# Patient Record
Sex: Male | Born: 1970
Health system: Southern US, Community
[De-identification: ages and names within clinical notes are randomized; demographics above are authoritative.]

## PROBLEM LIST (undated history)

## (undated) DIAGNOSIS — I10 Essential (primary) hypertension: Secondary | ICD-10-CM

## (undated) DIAGNOSIS — E1159 Type 2 diabetes mellitus with other circulatory complications: Secondary | ICD-10-CM

## (undated) DIAGNOSIS — G4730 Sleep apnea, unspecified: Secondary | ICD-10-CM

## (undated) DIAGNOSIS — D649 Anemia, unspecified: Secondary | ICD-10-CM

## (undated) DIAGNOSIS — I152 Hypertension secondary to endocrine disorders: Secondary | ICD-10-CM

## (undated) DIAGNOSIS — F419 Anxiety disorder, unspecified: Secondary | ICD-10-CM

## (undated) DIAGNOSIS — K219 Gastro-esophageal reflux disease without esophagitis: Secondary | ICD-10-CM

## (undated) DIAGNOSIS — K5792 Diverticulitis of intestine, part unspecified, without perforation or abscess without bleeding: Secondary | ICD-10-CM

## (undated) DIAGNOSIS — E119 Type 2 diabetes mellitus without complications: Secondary | ICD-10-CM

## (undated) HISTORY — DX: Essential (primary) hypertension: I10

## (undated) HISTORY — DX: Hypertension secondary to endocrine disorders: I15.2

## (undated) HISTORY — DX: Type 2 diabetes mellitus with other circulatory complications: E11.59

## (undated) HISTORY — PX: FRACTURE SURGERY: SHX138

## (undated) HISTORY — DX: Anemia, unspecified: D64.9

## (undated) HISTORY — DX: Anxiety disorder, unspecified: F41.9

## (undated) HISTORY — DX: Gastro-esophageal reflux disease without esophagitis: K21.9

---

## 1999-09-12 ENCOUNTER — Emergency Department (HOSPITAL_COMMUNITY): Admission: EM | Admit: 1999-09-12 | Discharge: 1999-09-12 | Payer: Self-pay | Admitting: Emergency Medicine

## 2000-08-22 ENCOUNTER — Encounter: Payer: Self-pay | Admitting: Emergency Medicine

## 2000-08-22 ENCOUNTER — Observation Stay (HOSPITAL_COMMUNITY): Admission: EM | Admit: 2000-08-22 | Discharge: 2000-08-23 | Payer: Self-pay | Admitting: Emergency Medicine

## 2000-08-23 ENCOUNTER — Encounter: Payer: Self-pay | Admitting: *Deleted

## 2000-11-10 ENCOUNTER — Encounter: Payer: Self-pay | Admitting: Gastroenterology

## 2000-11-10 ENCOUNTER — Encounter: Admission: RE | Admit: 2000-11-10 | Discharge: 2000-11-10 | Payer: Self-pay | Admitting: Gastroenterology

## 2006-09-25 ENCOUNTER — Ambulatory Visit: Payer: Self-pay | Admitting: Internal Medicine

## 2006-09-25 LAB — CONVERTED CEMR LAB
ALT: 52 units/L — ABNORMAL HIGH (ref 0–40)
AST: 28 units/L (ref 0–37)
BUN: 10 mg/dL (ref 6–23)
Basophils Absolute: 0 10*3/uL (ref 0.0–0.1)
Basophils Relative: 0 % (ref 0.0–1.0)
Chol/HDL Ratio, serum: 7.9
Cholesterol: 185 mg/dL (ref 0–200)
Creatinine, Ser: 1 mg/dL (ref 0.4–1.5)
Creatinine,U: 155.3 mg/dL
Eosinophil percent: 2.2 % (ref 0.0–5.0)
Free T4: 0.6 ng/dL — ABNORMAL LOW (ref 0.9–1.8)
HCT: 42.6 % (ref 39.0–52.0)
HDL: 23.5 mg/dL — ABNORMAL LOW (ref >39.0)
Hemoglobin: 14.3 g/dL (ref 13.0–17.0)
Hgb A1c MFr Bld: 5.8 % (ref 4.6–6.0)
LDL DIRECT: 117.5 mg/dL
Lymphocytes Relative: 24.6 % (ref 12.0–46.0)
MCHC: 33.5 g/dL (ref 30.0–36.0)
MCV: 83.5 fL (ref 78.0–100.0)
Microalb Creat Ratio: 3.2 mg/g (ref 0.0–30.0)
Microalb, Ur: 0.5 mg/dL (ref 0.0–1.9)
Monocytes Absolute: 0.1 10*3/uL — ABNORMAL LOW (ref 0.2–0.7)
Monocytes Relative: 1 % — ABNORMAL LOW (ref 3.0–11.0)
Neutro Abs: 6.8 10*3/uL (ref 1.4–7.7)
Neutrophils Relative %: 72.2 % (ref 43.0–77.0)
Platelets: 356 10*3/uL (ref 150–400)
Potassium: 4.2 meq/L (ref 3.5–5.1)
RBC: 5.1 M/uL (ref 4.22–5.81)
RDW: 13.5 % (ref 11.5–14.6)
TSH: 2.69 microintl units/mL (ref 0.35–5.50)
Triglyceride fasting, serum: 290 mg/dL (ref 0–149)
VLDL: 58 mg/dL — ABNORMAL HIGH (ref 0–40)
WBC: 9.4 10*3/uL (ref 4.5–10.5)

## 2006-11-28 DIAGNOSIS — K449 Diaphragmatic hernia without obstruction or gangrene: Secondary | ICD-10-CM | POA: Insufficient documentation

## 2008-01-16 ENCOUNTER — Telehealth (INDEPENDENT_AMBULATORY_CARE_PROVIDER_SITE_OTHER): Payer: Self-pay | Admitting: *Deleted

## 2008-01-16 ENCOUNTER — Emergency Department (HOSPITAL_COMMUNITY): Admission: EM | Admit: 2008-01-16 | Discharge: 2008-01-16 | Payer: Self-pay | Admitting: Emergency Medicine

## 2008-07-15 ENCOUNTER — Telehealth (INDEPENDENT_AMBULATORY_CARE_PROVIDER_SITE_OTHER): Payer: Self-pay | Admitting: *Deleted

## 2010-01-16 LAB — TSH: TSH: 2.64 u[IU]/mL (ref 0.41–5.90)

## 2010-01-16 LAB — HEPATIC FUNCTION PANEL
ALT: 19 U/L (ref 10–40)
AST: 37 U/L (ref 14–40)
Alkaline Phosphatase: 120 U/L (ref 25–125)
Bilirubin, Total: 0.4 mg/dL

## 2010-01-16 LAB — BASIC METABOLIC PANEL
BUN: 12 mg/dL (ref 4–21)
Creatinine: 0.8 mg/dL (ref 0.6–1.3)
Glucose: 104 mg/dL

## 2010-01-16 LAB — LIPID PANEL: LDL Cholesterol: 94 mg/dL

## 2011-11-10 ENCOUNTER — Ambulatory Visit (INDEPENDENT_AMBULATORY_CARE_PROVIDER_SITE_OTHER): Payer: PRIVATE HEALTH INSURANCE

## 2011-11-10 DIAGNOSIS — F411 Generalized anxiety disorder: Secondary | ICD-10-CM

## 2011-11-10 DIAGNOSIS — I10 Essential (primary) hypertension: Secondary | ICD-10-CM

## 2011-11-10 DIAGNOSIS — K219 Gastro-esophageal reflux disease without esophagitis: Secondary | ICD-10-CM

## 2012-10-20 ENCOUNTER — Other Ambulatory Visit: Payer: Self-pay | Admitting: Physician Assistant

## 2012-10-27 ENCOUNTER — Ambulatory Visit (INDEPENDENT_AMBULATORY_CARE_PROVIDER_SITE_OTHER): Payer: BC Managed Care – PPO | Admitting: Emergency Medicine

## 2012-10-27 VITALS — BP 125/85 | HR 77 | Temp 98.4°F | Resp 16 | Ht 74.0 in | Wt 352.0 lb

## 2012-10-27 DIAGNOSIS — K449 Diaphragmatic hernia without obstruction or gangrene: Secondary | ICD-10-CM

## 2012-10-27 DIAGNOSIS — Z23 Encounter for immunization: Secondary | ICD-10-CM

## 2012-10-27 DIAGNOSIS — I10 Essential (primary) hypertension: Secondary | ICD-10-CM

## 2012-10-27 MED ORDER — SERTRALINE HCL 100 MG PO TABS: 100.0000 mg | ORAL_TABLET | Freq: Every day | ORAL | Status: DC

## 2012-10-27 MED ORDER — LISINOPRIL-HYDROCHLOROTHIAZIDE 10-12.5 MG PO TABS: 1.0000 | ORAL_TABLET | Freq: Every day | ORAL | Status: DC

## 2012-10-27 MED ORDER — ESOMEPRAZOLE MAGNESIUM 40 MG PO CPDR: 40.0000 mg | DELAYED_RELEASE_CAPSULE | Freq: Every day | ORAL | Status: DC

## 2012-10-27 NOTE — Progress Notes (Signed)
[facility] [address] [place] [address] [phone]  Date:  10/27/2012   Name:  KELDRICK POMPLUN   DOB:  May 18, 1971   MRN:  [id number]  PCP:  No primary provider on file.    Chief Complaint: Medication Refill   History of Present Illness:  John Mullins is a 41 y.o. very pleasant male patient who presents with the following:   For refill on medications until he can come in for his usual physical.  Out of meds.  Tolerating medications well with no side effects.    Patient Active Problem List  Diagnosis  . HIATAL HERNIA    History reviewed. No pertinent past medical history.  History reviewed. No pertinent past surgical history.  History  Substance Use Topics  . Smoking status: Never Smoker   . Smokeless tobacco: Not on file  . Alcohol Use: No    History reviewed. No pertinent family history.  No Known Allergies  Medication list has been reviewed and updated.  Current Outpatient Prescriptions on File Prior to Visit  Medication Sig Dispense Refill  . esomeprazole (NEXIUM) 40 MG capsule Take 40 mg by mouth daily before breakfast.      . lisinopril-hydrochlorothiazide (PRINZIDE,ZESTORETIC) 10-12.5 MG per tablet TAKE ONE TABLET BY MOUTH EVERY DAY  30 tablet  0  . sertraline (ZOLOFT) 100 MG tablet Take 100 mg by mouth daily.        Review of Systems:  As per HPI, otherwise negative.    Physical Examination: Filed Vitals:   10/27/12 1222  BP: 125/85  Pulse: 77  Temp: 98.4 F (36.9 C)  Resp: 16   Filed Vitals:   10/27/12 1222  Height: 6\' 2"  (1.88 m)  Weight: 352 lb (159.666 kg)   Body mass index is 45.19 kg/(m^2). Ideal Body Weight: Weight in (lb) to have BMI = 25: 194.3   GEN: morbidly obese, NAD, Non-toxic, A & O x 3 HEENT: Atraumatic, Normocephalic. Neck supple. No masses, No LAD. Ears and Nose: No external deformity. CV: RRR, No M/G/R. No JVD. No thrill. No extra heart sounds. PULM: CTA B, no wheezes, crackles,  rhonchi. No retractions. No resp. distress. No accessory muscle use. ABD: S, NT, ND, +BS. No rebound. No HSM. EXTR: No c/c/e NEURO Normal gait.  PSYCH: Normally interactive. Conversant. Not depressed or anxious appearing.  Calm demeanor.    Assessment and Plan: Hypertension Hiatal hernia with reflux Refill meds Follow up for labs as directed  Carmelina Dane, MD

## 2012-11-06 ENCOUNTER — Telehealth: Payer: Self-pay

## 2012-11-06 MED ORDER — OMEPRAZOLE 40 MG PO CPDR: 40.0000 mg | DELAYED_RELEASE_CAPSULE | Freq: Every day | ORAL | Status: DC

## 2012-11-06 NOTE — Telephone Encounter (Signed)
Any alternative? Please advise.

## 2012-11-06 NOTE — Telephone Encounter (Signed)
Pt saying that the nexium we prescribed for him is 280.00 would like to know if we could call in something else please call [phone]

## 2012-11-06 NOTE — Telephone Encounter (Signed)
Omeprazole 40mg  sent to pharmacy, hopefully this will be more affordable

## 2012-11-07 NOTE — Telephone Encounter (Signed)
Pt's wife advised.

## 2013-02-11 ENCOUNTER — Other Ambulatory Visit: Payer: Self-pay | Admitting: Physician Assistant

## 2013-04-30 ENCOUNTER — Other Ambulatory Visit: Payer: Self-pay | Admitting: Physician Assistant

## 2013-06-04 ENCOUNTER — Ambulatory Visit (INDEPENDENT_AMBULATORY_CARE_PROVIDER_SITE_OTHER): Payer: BC Managed Care – PPO | Admitting: Physician Assistant

## 2013-06-04 VITALS — BP 128/88 | HR 96 | Temp 97.9°F | Resp 18 | Ht 73.0 in | Wt 348.0 lb

## 2013-06-04 DIAGNOSIS — F419 Anxiety disorder, unspecified: Secondary | ICD-10-CM

## 2013-06-04 DIAGNOSIS — E66813 Obesity, class 3: Secondary | ICD-10-CM

## 2013-06-04 DIAGNOSIS — K219 Gastro-esophageal reflux disease without esophagitis: Secondary | ICD-10-CM

## 2013-06-04 DIAGNOSIS — I10 Essential (primary) hypertension: Secondary | ICD-10-CM

## 2013-06-04 DIAGNOSIS — E1159 Type 2 diabetes mellitus with other circulatory complications: Secondary | ICD-10-CM | POA: Insufficient documentation

## 2013-06-04 DIAGNOSIS — F411 Generalized anxiety disorder: Secondary | ICD-10-CM

## 2013-06-04 HISTORY — DX: Morbid (severe) obesity due to excess calories: E66.01

## 2013-06-04 HISTORY — DX: Anxiety disorder, unspecified: F41.9

## 2013-06-04 HISTORY — DX: Generalized anxiety disorder: F41.1

## 2013-06-04 HISTORY — DX: Obesity, class 3: E66.813

## 2013-06-04 LAB — COMPREHENSIVE METABOLIC PANEL
ALT: 43 U/L (ref 0–53)
CO2: 28 mEq/L (ref 19–32)
Calcium: 10 mg/dL (ref 8.4–10.5)
Chloride: 102 mEq/L (ref 96–112)
Creat: 0.9 mg/dL (ref 0.50–1.35)
Total Protein: 7.4 g/dL (ref 6.0–8.3)

## 2013-06-04 LAB — LIPID PANEL
Cholesterol: 168 mg/dL (ref 0–200)
Total CHOL/HDL Ratio: 5.3 Ratio

## 2013-06-04 MED ORDER — LISINOPRIL-HYDROCHLOROTHIAZIDE 10-12.5 MG PO TABS: 1.0000 | ORAL_TABLET | Freq: Every day | ORAL | Status: DC

## 2013-06-04 MED ORDER — SERTRALINE HCL 100 MG PO TABS: 100.0000 mg | ORAL_TABLET | Freq: Every day | ORAL | Status: DC

## 2013-06-04 MED ORDER — OMEPRAZOLE 40 MG PO CPDR: 40.0000 mg | DELAYED_RELEASE_CAPSULE | Freq: Every day | ORAL | Status: DC

## 2013-06-04 NOTE — Progress Notes (Signed)
   [address], [place] [address]   Phone [phone]  Subjective:    Patient ID: John Mullins, male    DOB: 1971-02-15, 42 y.o.   MRN: [id number]  HPI Pt presents to clinic for med refills.  He feels good. 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  His dad died in his late 58s from a stroke with severe heart disease.  His GERD is well controlled on his meds as his anxiety.    Review of Systems  Respiratory: Negative for cough.   Cardiovascular: Negative for chest pain.  Psychiatric/Behavioral: The patient is nervous/anxious.        Objective:   Physical Exam  Vitals reviewed. Constitutional: He is oriented to person, place, and time. He appears well-developed and well-nourished.  HENT:  Head: Normocephalic and atraumatic.  Right Ear: External ear normal.  Left Ear: External ear normal.  Cardiovascular: Normal rate, regular rhythm and normal heart sounds.   No murmur heard. Pulmonary/Chest: Effort normal and breath sounds normal.  Neurological: He is alert and oriented to person, place, and time.  Skin: Skin is warm and dry.  Psychiatric: He has a normal mood and affect. His behavior is normal. Judgment and thought content normal.          Assessment & Plan:  HTN (hypertension) - Plan: Comprehensive metabolic panel, Lipid panel, lisinopril-hydrochlorothiazide (PRINZIDE,ZESTORETIC) 10-12.5 MG per tablet  Anxiety state, unspecified - Plan: sertraline (ZOLOFT) 100 MG tablet  GERD (gastroesophageal reflux disease) - Plan: omeprazole (PRILOSEC) 40 MG capsule  Obesity, Class III, BMI 40-49.9 (morbid obesity)  Pt has well controlled HTN, GERD and anxiety.  He is morbidly obese and we talked about that increasing his risk factors for heart disease. He has never had an EKG and he will plan on scheduling a CPE in 6 months when that can be done (he declined it today).  We will check his cholesterol today and treat if  indicated.  Benny Lennert PA-C 06/04/2013 9:46 AM

## 2013-06-06 NOTE — Progress Notes (Signed)
Left msg for pt to call to schedule future CPE.

## 2013-06-13 NOTE — Progress Notes (Signed)
Sent pt reminder letter to schedule future CPE.  

## 2014-01-30 ENCOUNTER — Telehealth: Payer: Self-pay

## 2014-01-30 ENCOUNTER — Other Ambulatory Visit: Payer: Self-pay | Admitting: Physician Assistant

## 2014-01-30 NOTE — Telephone Encounter (Signed)
Pts wife called for patient to request rx refill for bp meds

## 2014-01-30 NOTE — Telephone Encounter (Signed)
Needs office visit.

## 2014-03-14 ENCOUNTER — Other Ambulatory Visit: Payer: Self-pay | Admitting: Family Medicine

## 2014-03-14 ENCOUNTER — Ambulatory Visit (INDEPENDENT_AMBULATORY_CARE_PROVIDER_SITE_OTHER): Payer: BC Managed Care – PPO | Admitting: Family Medicine

## 2014-03-14 VITALS — BP 110/70 | HR 89 | Temp 98.2°F | Resp 18 | Ht 73.0 in | Wt 368.0 lb

## 2014-03-14 DIAGNOSIS — F329 Major depressive disorder, single episode, unspecified: Secondary | ICD-10-CM

## 2014-03-14 DIAGNOSIS — F3289 Other specified depressive episodes: Secondary | ICD-10-CM

## 2014-03-14 DIAGNOSIS — E669 Obesity, unspecified: Secondary | ICD-10-CM

## 2014-03-14 DIAGNOSIS — R5381 Other malaise: Secondary | ICD-10-CM

## 2014-03-14 DIAGNOSIS — K219 Gastro-esophageal reflux disease without esophagitis: Secondary | ICD-10-CM

## 2014-03-14 DIAGNOSIS — F32A Depression, unspecified: Secondary | ICD-10-CM

## 2014-03-14 DIAGNOSIS — I10 Essential (primary) hypertension: Secondary | ICD-10-CM

## 2014-03-14 DIAGNOSIS — R5383 Other fatigue: Secondary | ICD-10-CM

## 2014-03-14 DIAGNOSIS — D72829 Elevated white blood cell count, unspecified: Secondary | ICD-10-CM

## 2014-03-14 MED ORDER — LISINOPRIL-HYDROCHLOROTHIAZIDE 10-12.5 MG PO TABS
1.0000 | ORAL_TABLET | Freq: Every day | ORAL | Status: DC
Start: 2014-03-14 — End: 2015-04-27

## 2014-03-14 MED ORDER — SERTRALINE HCL 100 MG PO TABS: ORAL_TABLET | ORAL | Status: DC

## 2014-03-14 MED ORDER — OMEPRAZOLE 40 MG PO CPDR: DELAYED_RELEASE_CAPSULE | ORAL | Status: DC

## 2014-03-14 NOTE — Patient Instructions (Signed)
We will set up your sleep study, and  I will be in touch with your labs.  Good to see you today!

## 2014-03-14 NOTE — Progress Notes (Signed)
[facility] [address] [place] [address] [phone]  Date:  03/14/2014   Name:  John Mullins   DOB:  1971/08/19   MRN:  [id number]  PCP:  No PCP Per Patient    Chief Complaint: Medication Refill   History of Present Illness:  John Mullins is a 43 y.o. very pleasant male patient who presents with the following:  He needs a refill of his medications today.  He is doing well.  He does not generally check his BP. zoloft is doing well for him- he does not have anhedonia. His mood is good.  Energy level is "hit or miss.' He does admit that he snores quite a bit, and his wife notes that he may stop breathing at night. He thinks he may have sleep apnea and would like to have a sleep study He last ate around 5 hours ago today  Patient Active Problem List   Diagnosis Date Noted  . HTN (hypertension) 06/04/2013  . Anxiety state, unspecified 06/04/2013  . GERD (gastroesophageal reflux disease) 06/04/2013  . Obesity, Class III, BMI 40-49.9 (morbid obesity) 06/04/2013  . HIATAL HERNIA 11/28/2006    History reviewed. No pertinent past medical history.  History reviewed. No pertinent past surgical history.  History  Substance Use Topics  . Smoking status: Never Smoker   . Smokeless tobacco: Not on file  . Alcohol Use: No    Family History  Problem Relation Age of Onset  . Heart disease Father   . Stroke Father     No Known Allergies  Medication list has been reviewed and updated.  Current Outpatient Prescriptions on File Prior to Visit  Medication Sig Dispense Refill  . lisinopril-hydrochlorothiazide (PRINZIDE,ZESTORETIC) 10-12.5 MG per tablet Take 1 tablet by mouth daily.  30 tablet  5  . omeprazole (PRILOSEC) 40 MG capsule TAKE ONE CAPSULE BY MOUTH ONCE DAILY  30 capsule  0  . sertraline (ZOLOFT) 100 MG tablet TAKE ONE TABLET BY MOUTH ONCE DAILY  30 tablet  0   No current facility-administered medications on file prior to visit.     Review of Systems:  As per HPI- otherwise negative.   Physical Examination: Filed Vitals:   03/14/14 1652  BP: 110/70  Pulse: 89  Temp: 98.2 F (36.8 C)  Resp: 18   Filed Vitals:   03/14/14 1652  Height: 6\' 1"  (1.854 m)  Weight: 368 lb (166.924 kg)   Body mass index is 48.56 kg/(m^2). Ideal Body Weight: Weight in (lb) to have BMI = 25: 189.1  GEN: WDWN, NAD, Non-toxic, A & O x 3, morbid obesity, looks well HEENT: Atraumatic, Normocephalic. Neck supple. No masses, No LAD. Ears and Nose: No external deformity. CV: RRR, No M/G/R. No JVD. No thrill. No extra heart sounds. PULM: CTA B, no wheezes, crackles, rhonchi. No retractions. No resp. distress. No accessory muscle use. EXTR: No c/c/e NEURO Normal gait.  PSYCH: Normally interactive. Conversant. Not depressed or anxious appearing.  Calm demeanor.    Assessment and Plan: HTN (hypertension) - Plan: lisinopril-hydrochlorothiazide (PRINZIDE,ZESTORETIC) 10-12.5 MG per tablet, CBC with Differential, Comprehensive metabolic panel  Other malaise and fatigue - Plan: Nocturnal polysomnography (NPSG), TSH  Obesity, unspecified - Plan: Nocturnal polysomnography (NPSG), Lipid panel  GERD (gastroesophageal reflux disease) - Plan: omeprazole (PRILOSEC) 40 MG capsule  Depression - Plan: sertraline (ZOLOFT) 100 MG tablet  Bp well controlled.  Sleep study for likely OSA Refilled zoloft  Will plan further follow- up pending  labs.   Signed Lamar Blinks, MD

## 2014-03-15 LAB — CBC WITH DIFFERENTIAL/PLATELET
Basophils Absolute: 0 10*3/uL (ref 0.0–0.1)
Basophils Relative: 0 % (ref 0–1)
Eosinophils Absolute: 0.1 10*3/uL (ref 0.0–0.7)
Eosinophils Relative: 1 % (ref 0–5)
HCT: 39.6 % (ref 39.0–52.0)
HEMOGLOBIN: 13.5 g/dL (ref 13.0–17.0)
Lymphocytes Relative: 26 % (ref 12–46)
Lymphs Abs: 3.6 10*3/uL (ref 0.7–4.0)
MCH: 27.3 pg (ref 26.0–34.0)
MCHC: 34.1 g/dL (ref 30.0–36.0)
MCV: 80.2 fL (ref 78.0–100.0)
MONOS PCT: 7 % (ref 3–12)
Monocytes Absolute: 1 10*3/uL (ref 0.1–1.0)
NEUTROS ABS: 9.2 10*3/uL — AB (ref 1.7–7.7)
NEUTROS PCT: 66 % (ref 43–77)
Platelets: 405 10*3/uL — ABNORMAL HIGH (ref 150–400)
RBC: 4.94 MIL/uL (ref 4.22–5.81)
RDW: 15.1 % (ref 11.5–15.5)
WBC: 14 10*3/uL — ABNORMAL HIGH (ref 4.0–10.5)

## 2014-03-15 LAB — TSH: TSH: 3.121 u[IU]/mL (ref 0.350–4.500)

## 2014-03-16 LAB — COMPREHENSIVE METABOLIC PANEL
ALBUMIN: 4.5 g/dL (ref 3.5–5.2)
ALT: 36 U/L (ref 0–53)
AST: 22 U/L (ref 0–37)
Alkaline Phosphatase: 132 U/L — ABNORMAL HIGH (ref 39–117)
BUN: 15 mg/dL (ref 6–23)
CHLORIDE: 101 meq/L (ref 96–112)
CO2: 23 meq/L (ref 19–32)
Calcium: 9.6 mg/dL (ref 8.4–10.5)
Creat: 0.99 mg/dL (ref 0.50–1.35)
GLUCOSE: 94 mg/dL (ref 70–99)
POTASSIUM: 4.3 meq/L (ref 3.5–5.3)
SODIUM: 137 meq/L (ref 135–145)
TOTAL PROTEIN: 7.5 g/dL (ref 6.0–8.3)
Total Bilirubin: 0.4 mg/dL (ref 0.2–1.2)

## 2014-03-16 LAB — LIPID PANEL
CHOLESTEROL: 176 mg/dL (ref 0–200)
HDL: 32 mg/dL — ABNORMAL LOW (ref >39)
LDL Cholesterol: 96 mg/dL (ref 0–99)
Total CHOL/HDL Ratio: 5.5 Ratio
Triglycerides: 241 mg/dL — ABNORMAL HIGH (ref <150)
VLDL: 48 mg/dL — ABNORMAL HIGH (ref 0–40)

## 2014-03-18 ENCOUNTER — Encounter: Payer: Self-pay | Admitting: Family Medicine

## 2014-03-18 NOTE — Addendum Note (Signed)
Addended by: Lamar Blinks C on: 03/18/2014 03:09 PM   Modules accepted: Orders

## 2014-03-19 ENCOUNTER — Telehealth: Payer: Self-pay | Admitting: Radiology

## 2014-03-19 LAB — GAMMA GT: GGT: 53 U/L — ABNORMAL HIGH (ref 7–51)

## 2014-03-19 NOTE — Telephone Encounter (Signed)
I am reordering the sleep study, should be amb referral to sleep center, unless you want to see if patient can have home sleep study. Please advise, pended order.

## 2014-03-21 ENCOUNTER — Encounter: Payer: Self-pay | Admitting: Family Medicine

## 2014-10-13 ENCOUNTER — Ambulatory Visit (INDEPENDENT_AMBULATORY_CARE_PROVIDER_SITE_OTHER): Payer: BC Managed Care – PPO | Admitting: Physician Assistant

## 2014-10-13 VITALS — BP 128/82 | HR 109 | Temp 98.1°F | Resp 20 | Ht 73.0 in | Wt 363.0 lb

## 2014-10-13 DIAGNOSIS — H9201 Otalgia, right ear: Secondary | ICD-10-CM

## 2014-10-13 DIAGNOSIS — R059 Cough, unspecified: Secondary | ICD-10-CM

## 2014-10-13 DIAGNOSIS — R05 Cough: Secondary | ICD-10-CM

## 2014-10-13 DIAGNOSIS — R0981 Nasal congestion: Secondary | ICD-10-CM

## 2014-10-13 MED ORDER — BENZONATATE 100 MG PO CAPS: 100.0000 mg | ORAL_CAPSULE | Freq: Three times a day (TID) | ORAL | Status: DC | PRN

## 2014-10-13 MED ORDER — GUAIFENESIN ER 1200 MG PO TB12: 1.0000 | ORAL_TABLET | Freq: Two times a day (BID) | ORAL | Status: DC | PRN

## 2014-10-13 MED ORDER — IPRATROPIUM BROMIDE 0.03 % NA SOLN: 2.0000 | Freq: Two times a day (BID) | NASAL | Status: DC

## 2014-10-13 MED ORDER — AMOXICILLIN 500 MG PO CAPS: 1000.0000 mg | ORAL_CAPSULE | Freq: Three times a day (TID) | ORAL | Status: DC

## 2014-10-13 NOTE — Progress Notes (Signed)
Subjective:    Patient ID: John Mullins, male    DOB: 1971-04-08, 43 y.o.   MRN: [id number]  PCP: No PCP Per Patient  Chief Complaint  Patient presents with  . Cough    x 4 days getting worse  . Sore Throat  . Ear Pain    right   Patient Active Problem List   Diagnosis Date Noted  . HTN (hypertension) 06/04/2013  . Anxiety state, unspecified 06/04/2013  . GERD (gastroesophageal reflux disease) 06/04/2013  . Obesity, Class III, BMI 40-49.9 (morbid obesity) 06/04/2013  . HIATAL HERNIA 11/28/2006   Prior to Admission medications   Medication Sig Start Date End Date Taking? Authorizing Provider  lisinopril-hydrochlorothiazide (PRINZIDE,ZESTORETIC) 10-12.5 MG per tablet Take 1 tablet by mouth daily. 03/14/14  Yes Gay Filler Copland, MD  omeprazole (PRILOSEC) 40 MG capsule TAKE ONE CAPSULE BY MOUTH ONCE DAILY 03/14/14  Yes Gay Filler Copland, MD  sertraline (ZOLOFT) 100 MG tablet TAKE ONE TABLET BY MOUTH ONCE DAILY 03/14/14  Yes Gay Filler Copland, MD  amoxicillin (AMOXIL) 500 MG capsule Take 2 capsules (1,000 mg total) by mouth 3 (three) times daily. 10/13/14   Araceli Bouche, PA  benzonatate (TESSALON) 100 MG capsule Take 1-2 capsules (100-200 mg total) by mouth 3 (three) times daily as needed for cough. 10/13/14   Jonesha Tsuchiya, PA  Guaifenesin [facility] MAXIMUM STRENGTH) 1200 MG TB12 Take 1 tablet (1,200 mg total) by mouth every 12 (twelve) hours as needed. 10/13/14   Dashanna Kinnamon, PA  ipratropium (ATROVENT) 0.03 % nasal spray Place 2 sprays into both nostrils 2 (two) times daily. 10/13/14   Araceli Bouche, PA   Medications, allergies, past medical history, surgical history, family history, social history and problem list reviewed and updated.  HPI  43 yom with PMH htn presents with 4 day h/o cough, sore throat, congestion.  Sx initially started with mildly prod cough 4 days ago. White/yellow sputum, no blood. Cough has persisted. Has had sore throat past 3 days. Has had head congestion  and rhinorrhea past 3 days. Today his right ear started bothering him with moderate-severe pain. Has taken alka-seltzer cold/sinus and aleve without much relief.   Denies cp, sob, abd pain, n/v, diarrhea. Mild ha today.   HR elevated at 109 bpm today.   Review of Systems  No dysuria, constipation.     Objective:   Physical Exam  Constitutional: He is oriented to person, place, and time. He appears well-developed and well-nourished.  Non-toxic appearance. He does not have a sickly appearance. He does not appear ill. No distress.  BP 128/82 mmHg  Pulse 109  Temp(Src) 98.1 F (36.7 C)  Resp 20  Ht 6\' 1"  (1.854 m)  Wt 363 lb (164.656 kg)  BMI 47.90 kg/m2  SpO2 96%   HENT:  Right Ear: Ear canal normal. Tympanic membrane is erythematous. A middle ear effusion is present.  Left Ear: Tympanic membrane normal.  Nose: Mucosal edema and rhinorrhea present. Right sinus exhibits no maxillary sinus tenderness and no frontal sinus tenderness. Left sinus exhibits no maxillary sinus tenderness and no frontal sinus tenderness.  Mouth/Throat: Oropharynx is clear and moist and mucous membranes are normal. No oropharyngeal exudate, posterior oropharyngeal edema or posterior oropharyngeal erythema.  Neck: No Brudzinski's sign noted.  Pulmonary/Chest: Effort normal and breath sounds normal. He has no decreased breath sounds. He has no wheezes. He has no rhonchi. He has no rales.  Lymphadenopathy:       Head (right side): No submental,  no submandibular and no tonsillar adenopathy present.       Head (left side): No submental, no submandibular and no tonsillar adenopathy present.    He has no cervical adenopathy.  Neurological: He is alert and oriented to person, place, and time.  Psychiatric: He has a normal mood and affect. His speech is normal.      Assessment & Plan:   43 yom with PMH htn presents with 4 day h/o cough, sore throat, congestion.  Cough - Plan: benzonatate (TESSALON) 100 MG  capsule --mildly prod past 4 days --tessalon --lung exam clear --rtc 3-4 days if not resolving  Otalgia of right ear - Plan: amoxicillin (AMOXIL) 500 MG capsule --Right TM with effusion and erythema, mod-severe pain --10 days amox --HR mildly elevated today  Head congestion - Plan: Guaifenesin (MUCINEX MAXIMUM STRENGTH) 1200 MG TB12, ipratropium (ATROVENT) 0.03 % nasal spray --mucinex, atrovent  Julieta Gutting, PA-C Physician Assistant-Certified [facility]  10/13/2014 5:17 PM

## 2014-10-13 NOTE — Patient Instructions (Signed)
You most likely have an ear infection in your left ear. Please take the amoxicillin two pills, three times daily, for 10 days.  Please take the tessalon for cough every 8 hours as needed. Please take the mucinex twice daily and use the atrovent nasal spray 2 puffs in each nostril twice daily for the congestion as needed. Please return to clinic if your symptoms are not improving in the next 3-4 days.

## 2015-03-31 ENCOUNTER — Encounter: Payer: Self-pay | Admitting: Family Medicine

## 2015-04-27 ENCOUNTER — Other Ambulatory Visit: Payer: Self-pay | Admitting: Family Medicine

## 2015-06-10 ENCOUNTER — Ambulatory Visit (INDEPENDENT_AMBULATORY_CARE_PROVIDER_SITE_OTHER): Payer: BLUE CROSS/BLUE SHIELD | Admitting: Family Medicine

## 2015-06-10 DIAGNOSIS — K219 Gastro-esophageal reflux disease without esophagitis: Secondary | ICD-10-CM

## 2015-06-10 DIAGNOSIS — F329 Major depressive disorder, single episode, unspecified: Secondary | ICD-10-CM

## 2015-06-10 DIAGNOSIS — R0683 Snoring: Secondary | ICD-10-CM | POA: Diagnosis not present

## 2015-06-10 DIAGNOSIS — F32A Depression, unspecified: Secondary | ICD-10-CM

## 2015-06-10 DIAGNOSIS — I10 Essential (primary) hypertension: Secondary | ICD-10-CM | POA: Diagnosis not present

## 2015-06-10 LAB — COMPREHENSIVE METABOLIC PANEL
ALK PHOS: 125 U/L — AB (ref 40–115)
ALT: 37 U/L (ref 9–46)
AST: 21 U/L (ref 10–40)
Albumin: 4.4 g/dL (ref 3.6–5.1)
BUN: 15 mg/dL (ref 7–25)
CO2: 24 mmol/L (ref 20–31)
Calcium: 9.7 mg/dL (ref 8.6–10.3)
Chloride: 104 mmol/L (ref 98–110)
Creat: 0.87 mg/dL (ref 0.60–1.35)
Glucose, Bld: 99 mg/dL (ref 65–99)
Potassium: 4.5 mmol/L (ref 3.5–5.3)
SODIUM: 141 mmol/L (ref 135–146)
Total Bilirubin: 0.4 mg/dL (ref 0.2–1.2)
Total Protein: 7.4 g/dL (ref 6.1–8.1)

## 2015-06-10 LAB — LIPID PANEL
Cholesterol: 199 mg/dL (ref 125–200)
HDL: 35 mg/dL — ABNORMAL LOW (ref >=40)
LDL CALC: 132 mg/dL — AB (ref <130)
Total CHOL/HDL Ratio: 5.7 Ratio — ABNORMAL HIGH (ref <=5.0)
Triglycerides: 158 mg/dL — ABNORMAL HIGH (ref <150)
VLDL: 32 mg/dL — AB (ref <30)

## 2015-06-10 LAB — CBC
HCT: 41.5 % (ref 39.0–52.0)
Hemoglobin: 14 g/dL (ref 13.0–17.0)
MCH: 27.8 pg (ref 26.0–34.0)
MCHC: 33.7 g/dL (ref 30.0–36.0)
MCV: 82.3 fL (ref 78.0–100.0)
MPV: 9.1 fL (ref 8.6–12.4)
Platelets: 358 10*3/uL (ref 150–400)
RBC: 5.04 MIL/uL (ref 4.22–5.81)
RDW: 15.2 % (ref 11.5–15.5)
WBC: 13.4 10*3/uL — ABNORMAL HIGH (ref 4.0–10.5)

## 2015-06-10 LAB — HEMOGLOBIN A1C
HEMOGLOBIN A1C: 6.2 % — AB (ref <5.7)
MEAN PLASMA GLUCOSE: 131 mg/dL — AB (ref <117)

## 2015-06-10 LAB — TSH: TSH: 2.418 u[IU]/mL (ref 0.350–4.500)

## 2015-06-10 MED ORDER — OMEPRAZOLE 40 MG PO CPDR: 40.0000 mg | DELAYED_RELEASE_CAPSULE | Freq: Every day | ORAL | Status: DC

## 2015-06-10 MED ORDER — LISINOPRIL-HYDROCHLOROTHIAZIDE 10-12.5 MG PO TABS: 1.0000 | ORAL_TABLET | Freq: Every day | ORAL | Status: DC

## 2015-06-10 MED ORDER — SERTRALINE HCL 100 MG PO TABS: 100.0000 mg | ORAL_TABLET | Freq: Every day | ORAL | Status: DC

## 2015-06-10 NOTE — Patient Instructions (Signed)
Decrease your soda intake- gradually reduce to almost none. Try unsweetened tea, flavored waters, iced coffee Limit fried foods, desserts, white bread/pasta/rice/potatoes Eat regular meals to avoid getting really hungry and over eating. Come back in for a quick check up in 6 months

## 2015-06-10 NOTE — Progress Notes (Signed)
Subjective:    Patient ID: John Mullins, male    DOB: 29-Dec-1970, 44 y.o.   MRN: [id number]  HPI Patient presents today for refill of medications. He has been doing well on medications. Mood good on zoloft. GERD and hiatal hernia symptoms well controlled on daily omeprazole. Denies any side effects.   He reports that he does not sleep well, his wife reports loud snoring and periods of apnea. Patient has been obese for many years. He was 180 pounds while briefly in the [employer]. He drinks 5-7 bottles of regular soda yesterday. Skips lunch or eats Nabs frequently. Denies urinary frequency. Has been drinking a lot of water and Gatorade this summer because he is an Clinical biochemist and works outside.   Past Medical History  Diagnosis Date  . Anxiety   . GERD (gastroesophageal reflux disease)   . Hypertension    No past surgical history on file. Family History  Problem Relation Age of Onset  . Heart disease Father   . Stroke Father    Social History   Social History  . Marital Status: Married    Spouse Name: N/A  . Number of Children: N/A  . Years of Education: N/A   Occupational History  . Not on file.   Social History Main Topics  . Smoking status: Never Smoker   . Smokeless tobacco: Not on file  . Alcohol Use: No  . Drug Use: No  . Sexual Activity: Yes   Other Topics Concern  . Not on file    Review of Systems No chest pain, no SOB, good energy level, poor sleep- snores, stops breathing according to his wife.     Objective:   Physical Exam  Constitutional: He is oriented to person, place, and time. He appears well-developed and well-nourished.  Obese   HENT:  Head: Normocephalic and atraumatic.  Eyes: Conjunctivae are normal. Pupils are equal, round, and reactive to light.  Neck: Normal range of motion. Neck supple. No thyromegaly present.  Large neck.  Cardiovascular: Normal rate, regular rhythm, normal heart sounds and intact distal pulses.   Pulmonary/Chest:  Effort normal and breath sounds normal.  Musculoskeletal: He exhibits no edema.  Neurological: He is alert and oriented to person, place, and time.  Skin: Skin is warm and dry.  Psychiatric: He has a normal mood and affect. His behavior is normal. Judgment and thought content normal.  Vitals reviewed.   BP 112/88 mmHg  Pulse 96  Temp(Src) 98.8 F (37.1 C) (Oral)  Resp 16  Ht 6\' 1"  (1.854 m)  Wt 365 lb 12.8 oz (165.926 kg)  BMI 48.27 kg/m2  SpO2 97% Wt Readings from Last 3 Encounters:  06/10/15 365 lb 12.8 oz (165.926 kg)  10/13/14 363 lb (164.656 kg)  03/14/14 368 lb (166.924 kg)      Assessment & Plan:  1. Morbid obesity - Ambulatory referral to Neurology - Lipid panel - TSH - Hemoglobin A1c - encouraged decreased soda consumption, increase water, decrease sweets/ starches. - stressed importance of weight loss  2. Essential hypertension - lisinopril-hydrochlorothiazide (PRINZIDE,ZESTORETIC) 10-12.5 MG per tablet; Take 1 tablet by mouth daily.  Dispense: 90 tablet; Refill: 3 - CBC - Comprehensive metabolic panel - Lipid panel - TSH - Hemoglobin A1c  3. Depression - sertraline (ZOLOFT) 100 MG tablet; Take 1 tablet (100 mg total) by mouth daily.  Dispense: 90 tablet; Refill: 3  4. Gastroesophageal reflux disease, esophagitis presence not specified - omeprazole (PRILOSEC) 40 MG capsule; Take 1 capsule (  40 mg total) by mouth daily.  Dispense: 90 capsule; Refill: 1  5. Snoring - Ambulatory referral to Neurology  - Follow up in 6 months. Discussed option of establishing with regular provider at [facility] appointment center, patient states this does not work with his schedule and he prefers to walk in. [name], FNP-BC  [facility] [place] [facility]  06/10/2015 11:27 AM

## 2015-06-12 ENCOUNTER — Other Ambulatory Visit: Payer: Self-pay | Admitting: Family Medicine

## 2015-06-12 DIAGNOSIS — E7800 Pure hypercholesterolemia, unspecified: Secondary | ICD-10-CM

## 2015-06-12 MED ORDER — ATORVASTATIN CALCIUM 20 MG PO TABS: 20.0000 mg | ORAL_TABLET | Freq: Every day | ORAL | Status: DC

## 2015-06-21 ENCOUNTER — Encounter: Payer: Self-pay | Admitting: Family Medicine

## 2015-08-04 ENCOUNTER — Institutional Professional Consult (permissible substitution): Payer: BLUE CROSS/BLUE SHIELD | Admitting: Neurology

## 2015-09-01 ENCOUNTER — Telehealth: Payer: Self-pay | Admitting: Neurology

## 2015-09-01 ENCOUNTER — Encounter: Payer: Self-pay | Admitting: Neurology

## 2015-09-01 ENCOUNTER — Ambulatory Visit (INDEPENDENT_AMBULATORY_CARE_PROVIDER_SITE_OTHER): Payer: BLUE CROSS/BLUE SHIELD | Admitting: Neurology

## 2015-09-01 VITALS — BP 142/92 | HR 96 | Resp 20 | Ht 74.0 in | Wt 375.0 lb

## 2015-09-01 DIAGNOSIS — G4730 Sleep apnea, unspecified: Secondary | ICD-10-CM

## 2015-09-01 DIAGNOSIS — R0683 Snoring: Secondary | ICD-10-CM

## 2015-09-01 DIAGNOSIS — G4710 Hypersomnia, unspecified: Secondary | ICD-10-CM

## 2015-09-01 HISTORY — DX: Hypersomnia, unspecified: G47.10

## 2015-09-01 HISTORY — DX: Snoring: R06.83

## 2015-09-01 NOTE — Telephone Encounter (Signed)
Order placed

## 2015-09-01 NOTE — Telephone Encounter (Signed)
Patient stopped by to schedule his sleep study but I don't see an order for a sleep study.

## 2015-09-01 NOTE — Patient Instructions (Signed)
Hypersomnia Hypersomnia is when you feel extremely tired during the day even though you're getting plenty of sleep at night. You may need to take naps during the day, and you may also be extremely difficult to wake up when you are sleeping.  CAUSES  The cause of your hypersomnia may not be known. Hypersomnia may be caused by:   Medicines.  Sleep disorders, such as narcolepsy.  Trauma or injury to your head or nervous system.  Using drugs or alcohol.  Tumors.  Medical conditions, such as depression or hypothyroidism.  Genetics. SIGNS AND SYMPTOMS  The main symptoms of hypersomnia include:   Feeling extremely tired throughout the day.  Being very difficult to wake up.  Sleeping for longer and longer periods.  Taking naps throughout the day. Other symptoms may include:   Feeling:  Restless.  Annoyed.  Anxious.  Low energy.  Having difficulty:  Remembering.  Speaking.  Thinking.  Losing your appetite.  Experiencing hallucinations. DIAGNOSIS  Hypersomnia may be diagnosed by:  Medical history and physical exam. This will include a sleep history.  Completing sleep logs.  Tests may also be done, such as:  Polysomnography.  Multiple sleep latency test (MSLT). TREATMENT  There is no cure for hypersomnia, but treatment can be very effective in helping manage the condition. Treatment may include:  Lifestyle and sleeping strategies to help cope with the condition.  Stimulant medicines.  Treating any underlying causes of hypersomnia. HOME CARE INSTRUCTIONS  Take medicines only as directed by your health care provider.  Schedule short naps for when you feel sleepiest during the day. Tell your employer or teachers that you have hypersomnia. You may be able to adjust your schedule to include time for naps.  Avoid drinking alcohol or caffeinated beverages.  Do not eat a heavy meal before bedtime. Eat at about the same times every day.  Do not drive or  operate heavy machinery if you are sleepy.  Do not swim or go out on the water without a life jacket.  If possible, adjust your schedule so that you do not have to work or be active at night.  Keep all follow-up visits as directed by your health care provider. This is important. SEEK MEDICAL CARE IF:   You have new symptoms.  Your symptoms get worse. SEEK IMMEDIATE MEDICAL CARE IF:  You have serious thoughts of hurting yourself or someone else.   This information is not intended to replace advice given to you by your health care provider. Make sure you discuss any questions you have with your health care provider.   Document Released: 10/07/2002 Document Revised: 11/07/2014 Document Reviewed: 05/22/2014 Elsevier Interactive Patient Education 2016 [employer]  

## 2015-09-01 NOTE — Addendum Note (Signed)
Addended by: Lester Rondo A on: 09/01/2015 03:41 PM   Modules accepted: Orders

## 2015-09-01 NOTE — Progress Notes (Signed)
SLEEP MEDICINE CLINIC   Provider:  Larey Seat, M D  Referring Provider: Elby Beck, FNP Primary Care Physician:    Chief Complaint  Patient presents with  . New Patient (Initial Visit)    snoring, tired during day, rm 10, alone    HPI:  John Mullins is a 44 y.o. male , seen here as a referral from NP Carlean Purl from [facility] for a sleep consultation.  [name] stated that the patient has been obese for many years and reported to have had a normal body weight the last time but he was in the [employer]. His wife has noted him to snore loudly and she has witnessed periods of nocturnal apnea. The patient himself states that he does not feel that he has restorative or refreshing sleep. 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 He sometimes skips lunch. According to the referral notes he often naps.   Sleep habits are as follows:  The patient works until 3:30 PM and usually reaches home by about 4:30. At that time he often feels fatigued and he likes to nap. By 9:30 PM he usually goes to bed but may have already had a sleep time of 2-3 hours. Once in bed he falls asleep promptly again. He prefers to sleep on his side. He no longer shares a bedroom with his wife, who was very bothered by the snoring. Once or twice at night he will have to get up and urinate. He attributes this to a diuretic. Otherwise he will sleep through the night until the morning hours. He has to rise at about 5 AM, and usually wakes up spontaneously. Even on the weekends he will wake up at the same time. He does have an alarm in the background but does not rely on it. He drinks caffeinated sodas during the day and afternoon, he does not drink coffee or tea for breakfast. He usually does not take breakfast at home before going  to work. His work is mostly outdoors and he has access to natural daylight.  Sleep medical history and family sleep history:  The patient was diagnosed with his deviated septum, he does not have any ENT surgical history no facial injuries or trauma and no neck injuries. He never underwent a tonsillectomy or adenoidectomy. His father use to snore, his brother is a snorer. He is not sure if that ever been diagnosed or worked up for sleep apnea. His weight gain has been gradual over the years. Social history: Married with child, one daughter , age 47.   Review of Systems: Out of a complete 14 system review, the patient complains of only the following symptoms, and all other reviewed systems are negative. Endorsed were snoring, daytime napping increased need to sleep. Fatigue severity score was endorsed at 50 points and the Epworth sleepiness score at 7 points   depression score .1   Social History   Social History  . Marital Status: Married    Spouse Name: N/A  . Number of Children: N/A  . Years of Education: N/A   Occupational History  . Not on file.   Social History Main Topics  . Smoking status: Never Smoker   . Smokeless tobacco: Not on file  . Alcohol Use: No  . Drug Use:  No  . Sexual Activity: Yes   Other Topics Concern  . Not on file   Social History Narrative    Family History  Problem Relation Age of Onset  . Heart disease Father   . Stroke Father   . Diabetes Father     Past Medical History  Diagnosis Date  . Anxiety   . GERD (gastroesophageal reflux disease)   . Hypertension     No past surgical history on file.  Current Outpatient Prescriptions  Medication Sig Dispense Refill  . atorvastatin (LIPITOR) 20 MG tablet Take 1 tablet (20 mg total) by mouth daily. 90 tablet 1  . ipratropium (ATROVENT) 0.03 % nasal spray Place 2 sprays into both nostrils 2 (two) times daily. 30 mL 0  . lisinopril-hydrochlorothiazide (PRINZIDE,ZESTORETIC) 10-12.5 MG per tablet  Take 1 tablet by mouth daily. 90 tablet 3  . omeprazole (PRILOSEC) 40 MG capsule Take 1 capsule (40 mg total) by mouth daily. 90 capsule 1  . sertraline (ZOLOFT) 100 MG tablet Take 1 tablet (100 mg total) by mouth daily. 90 tablet 3   No current facility-administered medications for this visit.    Allergies as of 09/01/2015  . (No Known Allergies)    Vitals: BP 142/92 mmHg  Pulse 96  Resp 20  Ht 6\' 2"  (1.88 m)  Wt 375 lb (170.099 kg)  BMI 48.13 kg/m2 Last Weight:  Wt Readings from Last 1 Encounters:  09/01/15 375 lb (170.099 kg)   CVE:LFYB mass index is 48.13 kg/(m^2).     Last Height:   Ht Readings from Last 1 Encounters:  09/01/15 6\' 2"  (1.88 m)    Physical exam:  General: The patient is awake, alert and appears not in acute distress. The patient is well groomed. Head: Normocephalic, atraumatic. Neck is supple. Mallampati 4   neck circumference 19.25, Nasal airflow unrestricted , TMJ not  evident . Retrognathia is seen.  Cardiovascular:  Regular rate and rhythm , without  murmurs or carotid bruit, and without distended neck veins. Respiratory: Lungs are clear to auscultation. Skin:  Without evidence of edema, or rash Trunk: BMI is elevated  Neurologic exam : The patient is awake and alert, oriented to place and time. Attention span & concentration ability appears normal.  Speech is fluent,  without  dysarthria, dysphonia or aphasia.  Mood and affect are appropriate.  Cranial nerves: Pupils are equal and briskly reactive to light. Funduscopic exam without  evidence of pallor or edema.  Extraocular movements  in vertical and horizontal planes intact and without nystagmus. Visual fields by finger perimetry are intact. Hearing to finger rub intact.   Facial sensation intact to fine touch.  Facial motor strength is symmetric and tongue and uvula move midline. Shoulder shrug was symmetrical.   Motor exam:   Normal tone, muscle bulk and symmetric strength in all  extremities. The patient provides is symmetric and strong grip. There is no evidence of cogwheeling or decreased range of motion at any joints. Sensory:  Fine touch, pinprick and vibration were tested in all extremities. Proprioception tested in the upper extremities was normal. Coordination: Rapid alternating movements in the fingers/hands was normal. Finger-to-nose maneuver  normal without evidence of ataxia, dysmetria or tremor. Gait and station: Patient walks without assistive device and is able unassisted to climb up to the exam table. Strength within normal limits.  Stance is wider based - Turns with 3 Steps. Romberg testing is negative. Deep tendon reflexes: in the  upper and lower extremities are  brisk and symmetric . Babinski maneuver response is  downgoing.  The patient was advised of the nature of the diagnosed sleep disorder , the treatment options and risks for general a health and wellness arising from not treating the condition.  I spent more than 30 minutes of face to face time with the patient. Greater than 50% of time was spent in counseling and coordination of care. We have discussed the diagnosis and differential and I answered the patient's questions.     Assessment:  After physical and neurologic examination, review of laboratory studies,  Personal review of imaging studies, reports of other /same  Imaging studies ,  Results of polysomnography/ neurophysiology testing and pre-existing records as far as provided in visit., my assessment is   1) Mr. ende describes excessive daytime sleepiness and fatigue -  in spite of having usually more than 7 hours of nocturnal sleep he often naps additionally 2 or 3 hours. This would be a very high sleep need. He describes struggling to stay awake and alert after his workday has concluded. His wife has noted him to snore and she has witnessed apneas. All this contributes to a high index of suspicion for obstructive sleep apnea.   In addition,   the patient has a history of depression with anxiety  disorder , a  non-organic reason to be sleepy and fatigued .  2)obesity is the main risk factor the neck circumference of 19-1/4 inch. A low-carb diet would be the main past to weight loss. Patient. Would also encourage more physical activity but his fatigue and sleepiness may be a barrier to this.    Plan:  Treatment plan and additional workup : I will order a split night polysomnography for this patient. He does deny a dry mouth or headaches in the morning so capnography is not needed unless available.  I will order an attended sleep study with an AHI of 15 and a score of 3% dust desaturation.  At times the patient has snort himself awake or a cast for air or felt air hungry. Currently is not his main allergy season so would like for him to be tested now before he develops rhinitis sinusitis or bronchitis. I will follow up with the patient after the sleep study is concluded .      John Partridge Rainey Kahrs MD  09/01/2015   CC: Elby Beck, [address] [place], [place] [address]

## 2016-01-23 ENCOUNTER — Ambulatory Visit (INDEPENDENT_AMBULATORY_CARE_PROVIDER_SITE_OTHER): Payer: BLUE CROSS/BLUE SHIELD | Admitting: Physician Assistant

## 2016-01-23 VITALS — BP 110/70 | HR 79 | Temp 98.1°F | Resp 16 | Ht 73.5 in | Wt 377.0 lb

## 2016-01-23 DIAGNOSIS — Z207 Contact with and (suspected) exposure to pediculosis, acariasis and other infestations: Secondary | ICD-10-CM

## 2016-01-23 DIAGNOSIS — E7800 Pure hypercholesterolemia, unspecified: Secondary | ICD-10-CM | POA: Diagnosis not present

## 2016-01-23 DIAGNOSIS — Z2089 Contact with and (suspected) exposure to other communicable diseases: Secondary | ICD-10-CM | POA: Diagnosis not present

## 2016-01-23 DIAGNOSIS — K219 Gastro-esophageal reflux disease without esophagitis: Secondary | ICD-10-CM | POA: Diagnosis not present

## 2016-01-23 MED ORDER — OMEPRAZOLE 40 MG PO CPDR: 40.0000 mg | DELAYED_RELEASE_CAPSULE | Freq: Every day | ORAL | Status: DC

## 2016-01-23 MED ORDER — PERMETHRIN 5 % EX CREA: 1.0000 "application " | TOPICAL_CREAM | Freq: Once | CUTANEOUS | Status: DC

## 2016-01-23 MED ORDER — ATORVASTATIN CALCIUM 20 MG PO TABS: 20.0000 mg | ORAL_TABLET | Freq: Every day | ORAL | Status: DC

## 2016-01-23 NOTE — Patient Instructions (Signed)
     IF you received an x-ray today, you will receive an invoice from [facility]. Please contact [facility] at [phone] with questions or concerns regarding your invoice.   IF you received labwork today, you will receive an invoice from [facility]. Please contact [facility] at [phone] with questions or concerns regarding your invoice.   Our billing staff will not be able to assist you with questions regarding bills from these companies.  You will be contacted with the lab results as soon as they are available. The fastest way to get your results is to activate your My Chart account. Instructions are located on the last page of this paperwork. If you have not heard from us regarding the results in 2 weeks, please contact this office.      

## 2016-01-23 NOTE — Progress Notes (Signed)
   John Mullins  MRN: [id number] DOB: [birth year]  Subjective:  Pt presents to clinic because wife was diagnosed with scabies today after dealing with it for about 6 weeks.  He has no current rash or itching.  He does need refills of his cholesterol medications and his reflux medications.    Patient Active Problem List   Diagnosis Date Noted  . Morbid obesity due to excess calories ([place]) 09/01/2015  . Hypersomnia with sleep apnea 09/01/2015  . Snoring 09/01/2015  . HTN (hypertension) 06/04/2013  . Anxiety state, unspecified 06/04/2013  . GERD (gastroesophageal reflux disease) 06/04/2013  . Obesity, Class III, BMI 40-49.9 (morbid obesity) ([place]) 06/04/2013  . HIATAL HERNIA 11/28/2006    Current Outpatient Prescriptions on File Prior to Visit  Medication Sig Dispense Refill  . ipratropium (ATROVENT) 0.03 % nasal spray Place 2 sprays into both nostrils 2 (two) times daily. 30 mL 0  . lisinopril-hydrochlorothiazide (PRINZIDE,ZESTORETIC) 10-12.5 MG per tablet Take 1 tablet by mouth daily. 90 tablet 3  . sertraline (ZOLOFT) 100 MG tablet Take 1 tablet (100 mg total) by mouth daily. 90 tablet 3   No current facility-administered medications on file prior to visit.    No Known Allergies  Review of Systems  Skin: Negative for rash.   Objective:  BP 110/70 mmHg  Pulse 79  Temp(Src) 98.1 F (36.7 C) (Oral)  Resp 16  Ht 6' 1.5" (1.867 m)  Wt 377 lb (171.006 kg)  BMI 49.06 kg/m2  SpO2 96%  Physical Exam  Constitutional: He is oriented to person, place, and time and well-developed, well-nourished, and in no distress.  HENT:  Head: Normocephalic and atraumatic.  Right Ear: External ear normal.  Left Ear: External ear normal.  Eyes: Conjunctivae are normal.  Neck: Normal range of motion.  Pulmonary/Chest: Effort normal.  Neurological: He is alert and oriented to person, place, and time. Gait normal.  Skin: Skin is warm and dry.  Psychiatric: Mood, memory, affect and judgment  normal.    Assessment and Plan :  Scabies exposure - Plan: permethrin (ELIMITE) 5 % cream - discussed how to use  Hypercholesteremia - Plan: atorvastatin (LIPITOR) 20 MG tablet  Gastroesophageal reflux disease, esophagitis presence not specified - Plan: omeprazole (PRILOSEC) 40 MG capsule  Pt is not fasting today - he will need f/u with his PCP for lab testing further medications before he runs out of this Rx.  I gave a 3 month supply for insurance purposes.  Windell Hummingbird PA-C  [facility] 01/23/2016 1:47 PM

## 2016-03-10 ENCOUNTER — Other Ambulatory Visit: Payer: Self-pay | Admitting: Physician Assistant

## 2016-05-25 ENCOUNTER — Emergency Department (HOSPITAL_COMMUNITY): Payer: Worker's Compensation

## 2016-05-25 ENCOUNTER — Emergency Department (HOSPITAL_COMMUNITY)
Admission: EM | Admit: 2016-05-25 | Discharge: 2016-05-25 | Disposition: A | Discharge status: Home / Self Care | Payer: Worker's Compensation | Attending: Emergency Medicine | Admitting: Emergency Medicine

## 2016-05-25 DIAGNOSIS — S0101XA Laceration without foreign body of scalp, initial encounter: Secondary | ICD-10-CM | POA: Insufficient documentation

## 2016-05-25 DIAGNOSIS — G4489 Other headache syndrome: Secondary | ICD-10-CM | POA: Diagnosis not present

## 2016-05-25 DIAGNOSIS — Y929 Unspecified place or not applicable: Secondary | ICD-10-CM | POA: Diagnosis not present

## 2016-05-25 DIAGNOSIS — Y939 Activity, unspecified: Secondary | ICD-10-CM | POA: Insufficient documentation

## 2016-05-25 DIAGNOSIS — Y999 Unspecified external cause status: Secondary | ICD-10-CM | POA: Diagnosis not present

## 2016-05-25 DIAGNOSIS — Z79899 Other long term (current) drug therapy: Secondary | ICD-10-CM | POA: Diagnosis not present

## 2016-05-25 DIAGNOSIS — S098XXA Other specified injuries of head, initial encounter: Secondary | ICD-10-CM | POA: Diagnosis not present

## 2016-05-25 DIAGNOSIS — I10 Essential (primary) hypertension: Secondary | ICD-10-CM | POA: Diagnosis not present

## 2016-05-25 DIAGNOSIS — S0990XA Unspecified injury of head, initial encounter: Secondary | ICD-10-CM | POA: Diagnosis not present

## 2016-05-25 DIAGNOSIS — W228XXA Striking against or struck by other objects, initial encounter: Secondary | ICD-10-CM | POA: Diagnosis not present

## 2016-05-25 MED ORDER — HYDROMORPHONE HCL 1 MG/ML IJ SOLN
1.0000 mg | Freq: Once | INTRAMUSCULAR | Status: AC
  Administered 2016-05-25: 1 mg via INTRAVENOUS
  Filled 2016-05-25: qty 1

## 2016-05-25 MED ORDER — LIDOCAINE-EPINEPHRINE 1 %-1:100000 IJ SOLN
10.0000 mL | Freq: Once | INTRAMUSCULAR | Status: AC
  Administered 2016-05-25: 10 mL
  Filled 2016-05-25: qty 1

## 2016-05-25 NOTE — ED Provider Notes (Signed)
[facility] DEPT Provider Note   CSN: [id number] Arrival date & time: 05/25/16  [id number]  First Provider Contact:  First MD Initiated Contact with Patient 05/25/16 [id number]        History   Chief Complaint Chief Complaint  Patient presents with  . Head Injury    HPI John Mullins is a 45 y.o. male.  HPI   45 year old male with head injury. Happened just before arrival. Patient was stepping into his work truck. He essentially head butted the metal framing above him. He sustained a large scalp laceration. No LOC. He denies any significant pain aside from his head/scalp. He specifically denies any neck or back pain. No nausea. No acute visual changes. No acute numbness, tingling or loss of strength. No blood thinners. Last tetanus was approximately 4 years ago.  Past Medical History:  Diagnosis Date  . Anxiety   . GERD (gastroesophageal reflux disease)   . Hypertension     Patient Active Problem List   Diagnosis Date Noted  . Morbid obesity due to excess calories ([place]) 09/01/2015  . Hypersomnia with sleep apnea 09/01/2015  . Snoring 09/01/2015  . HTN (hypertension) 06/04/2013  . Anxiety state, unspecified 06/04/2013  . GERD (gastroesophageal reflux disease) 06/04/2013  . Obesity, Class III, BMI 40-49.9 (morbid obesity) ([place]) 06/04/2013  . HIATAL HERNIA 11/28/2006    No past surgical history on file.     Home Medications    Prior to Admission medications   Medication Sig Start Date End Date Taking? Authorizing Provider  atorvastatin (LIPITOR) 20 MG tablet TAKE ONE TABLET BY MOUTH ONCE DAILY 03/11/16   Mancel Bale, PA-C  ipratropium (ATROVENT) 0.03 % nasal spray Place 2 sprays into both nostrils 2 (two) times daily. 10/13/14   Araceli Bouche, PA  lisinopril-hydrochlorothiazide (PRINZIDE,ZESTORETIC) 10-12.5 MG per tablet Take 1 tablet by mouth daily. 06/10/15   Elby Beck, FNP  omeprazole (PRILOSEC) 40 MG capsule TAKE ONE CAPSULE BY MOUTH ONCE DAILY 03/11/16   Mancel Bale, PA-C  permethrin (ELIMITE) 5 % cream Apply 1 application topically once. 01/23/16   Mancel Bale, PA-C  sertraline (ZOLOFT) 100 MG tablet Take 1 tablet (100 mg total) by mouth daily. 06/10/15   Elby Beck, FNP    Family History Family History  Problem Relation Age of Onset  . Heart disease Father   . Stroke Father   . Diabetes Father     Social History Social History  Substance Use Topics  . Smoking status: Never Smoker  . Smokeless tobacco: Not on file  . Alcohol use No     Allergies   Review of patient's allergies indicates no known allergies.   Review of Systems Review of Systems   All systems reviewed and negative, other than as noted in HPI.  Physical Exam Updated Vital Signs BP 111/63   Pulse 69   Temp 97.8 F (36.6 C) (Oral)   Resp 14   SpO2 94%   Physical Exam  Constitutional: He is oriented to person, place, and time. He appears well-developed and well-nourished.  HENT:  Head: Normocephalic.    8 cm crescent-shaped laceration in the depicted area. Some areas mildly gaping and with tissue bridging. Dried blood. No active bleeding. No obvious galeal involvement. Appropriate scalp tenderness.  No step-off.  Eyes: Conjunctivae are normal.  Neck: Neck supple.  Cardiovascular: Normal rate and regular rhythm.   No murmur heard. Pulmonary/Chest: Effort normal and breath sounds normal. No respiratory distress.  Abdominal: Soft.  There is no tenderness.  Musculoskeletal: He exhibits no edema.  No midline spinal tenderness  Neurological: He is alert and oriented to person, place, and time. No cranial nerve deficit. He exhibits normal muscle tone. Coordination normal.  Skin: Skin is warm and dry.  Psychiatric: He has a normal mood and affect.  Nursing note and vitals reviewed.    ED Treatments / Results  Labs (all labs ordered are listed, but only abnormal results are displayed) Labs Reviewed - No data to display  EKG  EKG  Interpretation None       Radiology Ct Head Wo Contrast  Result Date: 05/25/2016 CLINICAL DATA:  Hip on top of head with metal framing this morning. Laceration and pain. EXAM: CT HEAD WITHOUT CONTRAST TECHNIQUE: Contiguous axial images were obtained from the base of the skull through the vertex without intravenous contrast. COMPARISON:  None. FINDINGS: There is no evidence for acute hemorrhage, hydrocephalus, mass lesion, or abnormal extra-axial fluid collection. No definite CT evidence for acute infarction. Minimal chronic polypoid disease noted right maxillary sinus. Remaining visualized paranasal sinuses and mastoid air cells are clear. No evidence for skull fracture. IMPRESSION: 1. No acute intracranial abnormality. Electronically Signed   By: Misty Stanley M.D.   On: 05/25/2016 07:59   Procedures Procedures (including critical care time)  LACERATION REPAIR Performed by: Virgel Manifold Authorized by: Virgel Manifold Consent: Verbal consent obtained. Risks and benefits: risks, benefits and alternatives were discussed Consent given by: patient Patient identity confirmed: provided demographic data Prepped and Draped in normal sterile fashion Wound explored  Laceration Location: scalp  Laceration Length: 8cm  No Foreign Bodies seen or palpated  Anesthesia: local infiltration  Local anesthetic: lidocaine 1% w epinephrine  Anesthetic total: 5 ml  Irrigation method: syringe  Amount of cleaning: standard  Skin closure: single layer  Number of sutures: 16  Technique: stapled  Patient tolerance: Patient tolerated the procedure well with no immediate complications.  Medications Ordered in ED Medications  lidocaine-EPINEPHrine (XYLOCAINE W/EPI) 1 %-1:100000 (with pres) injection 10 mL (not administered)  HYDROmorphone (DILAUDID) injection 1 mg (1 mg Intravenous Given 05/25/16 0757)     Initial Impression / Assessment and Plan / ED Course  I have reviewed the triage  vital signs and the nursing notes.  Pertinent labs & imaging results that were available during my care of the patient were reviewed by me and considered in my medical decision making (see chart for details).  Clinical Course    45 year old male with head injury/scalp laceration. No particular concerning features in terms of head injury. There was no LOC. No blood thinners. No neck pain or tenderness on exam. Nonfocal neurological examination. Neuroimaging without skull fracture or evidence of serious intracranial injury. Tetanus is current per patient report. Laceration was irrigated, explored and closed. Head injury and wound care instructions were discussed as well as return precautions. Follow-up for staple removal in 7-10 days and as needed therwise  Final Clinical Impressions(s) / ED Diagnoses   Final diagnoses:  Head injury, initial encounter  Scalp laceration, initial encounter    New Prescriptions New Prescriptions   No medications on file     Virgel Manifold, MD 05/25/16 [phone]

## 2016-05-25 NOTE — ED Triage Notes (Signed)
[place] [facility], per report pt pulled over to readjust something on the back of his truck & was hit in the head with a metal piece on his work rack, pt denies LOC, pt has estimated 3-4 inch lac with bleeding controlled upon arrival to ED, pt A&O x4, MAE

## 2016-08-10 ENCOUNTER — Other Ambulatory Visit: Payer: Self-pay | Admitting: *Deleted

## 2016-08-10 ENCOUNTER — Other Ambulatory Visit: Payer: Self-pay | Admitting: Family Medicine

## 2016-08-10 DIAGNOSIS — F32A Depression, unspecified: Secondary | ICD-10-CM

## 2016-08-10 DIAGNOSIS — F329 Major depressive disorder, single episode, unspecified: Secondary | ICD-10-CM

## 2016-08-10 DIAGNOSIS — I10 Essential (primary) hypertension: Secondary | ICD-10-CM

## 2016-08-10 MED ORDER — OMEPRAZOLE 40 MG PO CPDR: 40.0000 mg | DELAYED_RELEASE_CAPSULE | Freq: Every day | ORAL | 0 refills | Status: DC

## 2016-08-10 MED ORDER — LISINOPRIL-HYDROCHLOROTHIAZIDE 10-12.5 MG PO TABS: 1.0000 | ORAL_TABLET | Freq: Every day | ORAL | 0 refills | Status: DC

## 2016-08-10 NOTE — Telephone Encounter (Signed)
PATIENT WOULD  LIKE TO GET REFILLS ON OMEPRAZOLE 40 MG, SINOPRIL 10-12.5 MG AND SERTRALINE 100 MG. HE SAID HE CAN NOT COME INTO THE OFFICE UNTIL NEXT MONTH (NOV). 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 HE SAID HE WOULD CALL IN November TO MAKE AND APPOINTMENT TO COME INTO THE OFFICE. BEST PHONE [phone]  PHARMACY CHOICE IS [place] [facility] [place] [facility].  [place]

## 2016-09-21 ENCOUNTER — Ambulatory Visit (INDEPENDENT_AMBULATORY_CARE_PROVIDER_SITE_OTHER): Payer: BLUE CROSS/BLUE SHIELD | Admitting: Family Medicine

## 2016-09-21 VITALS — BP 130/82 | HR 105 | Temp 98.2°F | Resp 17 | Ht 73.5 in | Wt 375.0 lb

## 2016-09-21 DIAGNOSIS — I10 Essential (primary) hypertension: Secondary | ICD-10-CM | POA: Diagnosis not present

## 2016-09-21 DIAGNOSIS — Z23 Encounter for immunization: Secondary | ICD-10-CM

## 2016-09-21 DIAGNOSIS — R7303 Prediabetes: Secondary | ICD-10-CM | POA: Insufficient documentation

## 2016-09-21 DIAGNOSIS — E7849 Other hyperlipidemia: Secondary | ICD-10-CM

## 2016-09-21 DIAGNOSIS — E784 Other hyperlipidemia: Secondary | ICD-10-CM

## 2016-09-21 LAB — COMPREHENSIVE METABOLIC PANEL
ALBUMIN: 4.2 g/dL (ref 3.6–5.1)
ALT: 40 U/L (ref 9–46)
AST: 25 U/L (ref 10–40)
Alkaline Phosphatase: 132 U/L — ABNORMAL HIGH (ref 40–115)
BILIRUBIN TOTAL: 0.3 mg/dL (ref 0.2–1.2)
BUN: 17 mg/dL (ref 7–25)
CALCIUM: 9.7 mg/dL (ref 8.6–10.3)
CHLORIDE: 106 mmol/L (ref 98–110)
CO2: 23 mmol/L (ref 20–31)
Creat: 0.98 mg/dL (ref 0.60–1.35)
GLUCOSE: 98 mg/dL (ref 65–99)
POTASSIUM: 4.1 mmol/L (ref 3.5–5.3)
Sodium: 140 mmol/L (ref 135–146)
Total Protein: 7.4 g/dL (ref 6.1–8.1)

## 2016-09-21 LAB — LIPID PANEL
CHOL/HDL RATIO: 5.9 ratio — AB (ref <5.0)
CHOLESTEROL: 189 mg/dL (ref <200)
HDL: 32 mg/dL — AB (ref >40)
LDL Cholesterol: 116 mg/dL — ABNORMAL HIGH (ref <100)
Triglycerides: 204 mg/dL — ABNORMAL HIGH (ref <150)
VLDL: 41 mg/dL — AB (ref <30)

## 2016-09-21 LAB — POCT GLYCOSYLATED HEMOGLOBIN (HGB A1C): Hemoglobin A1C: 6.1

## 2016-09-21 MED ORDER — OMEPRAZOLE 40 MG PO CPDR: 40.0000 mg | DELAYED_RELEASE_CAPSULE | Freq: Every day | ORAL | 0 refills | Status: DC

## 2016-09-21 MED ORDER — LISINOPRIL-HYDROCHLOROTHIAZIDE 10-12.5 MG PO TABS: 1.0000 | ORAL_TABLET | Freq: Every day | ORAL | 0 refills | Status: DC

## 2016-09-21 MED ORDER — SERTRALINE HCL 100 MG PO TABS: 100.0000 mg | ORAL_TABLET | Freq: Every day | ORAL | 1 refills | Status: DC

## 2016-09-21 MED ORDER — ATORVASTATIN CALCIUM 20 MG PO TABS: 20.0000 mg | ORAL_TABLET | Freq: Every day | ORAL | 1 refills | Status: DC

## 2016-09-21 MED ORDER — SIMVASTATIN 10 MG PO TABS: 10.0000 mg | ORAL_TABLET | Freq: Every day | ORAL | 3 refills | Status: DC

## 2016-09-21 NOTE — Progress Notes (Signed)
   John Mullins is a 45 y.o. 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  Pertinently, no chest pain, palpitations, SOB, Fever, Chills, Abd pain, N/V/D.   PMH reviewed. Patient is a nonsmoker.   Past Medical History:  Diagnosis Date  . Anxiety   . GERD (gastroesophageal reflux disease)   . Hypertension    No past surgical history on file.  Medications reviewed. Current Outpatient Prescriptions  Medication Sig Dispense Refill  . atorvastatin (LIPITOR) 20 MG tablet TAKE ONE TABLET BY MOUTH ONCE DAILY 90 tablet 0  . ipratropium (ATROVENT) 0.03 % nasal spray Place 2 sprays into both nostrils 2 (two) times daily. 30 mL 0  . lisinopril-hydrochlorothiazide (PRINZIDE,ZESTORETIC) 10-12.5 MG tablet Take 1 tablet by mouth daily. 30 tablet 0  . omeprazole (PRILOSEC) 40 MG capsule Take 1 capsule (40 mg total) by mouth daily. 30 capsule 0  . permethrin (ELIMITE) 5 % cream Apply 1 application topically once. 60 g 1  . sertraline (ZOLOFT) 100 MG tablet TAKE ONE TABLET BY MOUTH ONCE DAILY 30 tablet 0   No current facility-administered medications for this visit.      Physical Exam:  BP 130/82 (BP Location: Right Arm, Patient Position: Sitting, Cuff Size: Normal)   Pulse (!) 105   Temp 98.2 F (36.8 C) (Oral)   Resp 17   Ht 6' 1.5" (1.867 m)   Wt (!) 375 lb (170.1 kg)   SpO2 96%   BMI 48.80 kg/m  Gen:  Alert, cooperative patient who appears stated age  in no acute distress.  Vital signs reviewed. HEENT: EOMI,  MMM Pulm:  Clear to auscultation bilaterally with good air movement.  No wheezes or rales noted.   Cardiac:  Regular rate and rhythm without murmur auscultated.  Good S1/S2. Abd:  Soft/nondistended/nontender.  Good bowel sounds throughout all four quadrants.  No masses noted.  Exts: Non edematous BL  LE, warm and well perfused.   Assessment and Plan:  1.  Hypertension - BP stable today, Lisinopril-HCTZ refilled  2. GERD - omeprazole refilled  3, Hyperlipidemia - will start simvastatin to replace atorvastatin as he had what sounds loke myalgias with atorvastatin  4. Anxiety - Stable. Zoloft refilled today  5., Prediabetes - Hgb A1c today- 6.1 - diet and exercise discussed today   Priscille Shadduck A. Lincoln Brigham MD, [facility] Resident PGY-3 Pager [phone]

## 2016-09-21 NOTE — Patient Instructions (Addendum)
  Happy Thanksgiving! Obtain twice as many veg's as protein or carbohydrate foods for both lunch and dinner. Get at last 30 mins of exercise per day Please take Simvastatin daily. If you cannot tolerate it, please return to discuss alternatives  If you have any questions or concerns, call the office or return for evaluatoin   IF you received an x-ray today, you will receive an invoice from [facility]. Please contact [facility] at [phone] with questions or concerns regarding your invoice.   IF you received labwork today, you will receive an invoice from [employer]. Please contact [facility] at [phone] with questions or concerns regarding your invoice.   Our billing staff will not be able to assist you with questions regarding bills from these companies.  You will be contacted with the lab results as soon as they are available. The fastest way to get your results is to activate your My Chart account. Instructions are located on the last page of this paperwork. If you have not heard from [place] regarding the results in 2 weeks, please contact this office.

## 2017-01-20 ENCOUNTER — Encounter: Payer: Self-pay | Admitting: Family Medicine

## 2017-03-11 ENCOUNTER — Other Ambulatory Visit: Payer: Self-pay | Admitting: Emergency Medicine

## 2017-03-11 DIAGNOSIS — I10 Essential (primary) hypertension: Secondary | ICD-10-CM

## 2017-03-11 MED ORDER — LISINOPRIL-HYDROCHLOROTHIAZIDE 10-12.5 MG PO TABS: 1.0000 | ORAL_TABLET | Freq: Every day | ORAL | 3 refills | Status: DC

## 2017-08-16 ENCOUNTER — Encounter: Payer: Self-pay | Admitting: Emergency Medicine

## 2017-08-16 ENCOUNTER — Ambulatory Visit (INDEPENDENT_AMBULATORY_CARE_PROVIDER_SITE_OTHER): Payer: BLUE CROSS/BLUE SHIELD | Admitting: Emergency Medicine

## 2017-08-16 VITALS — BP 126/84 | HR 110 | Temp 98.0°F | Resp 18 | Ht 73.5 in | Wt 383.6 lb

## 2017-08-16 DIAGNOSIS — K219 Gastro-esophageal reflux disease without esophagitis: Secondary | ICD-10-CM | POA: Diagnosis not present

## 2017-08-16 DIAGNOSIS — Z23 Encounter for immunization: Secondary | ICD-10-CM | POA: Diagnosis not present

## 2017-08-16 DIAGNOSIS — Z760 Encounter for issue of repeat prescription: Secondary | ICD-10-CM | POA: Diagnosis not present

## 2017-08-16 DIAGNOSIS — I10 Essential (primary) hypertension: Secondary | ICD-10-CM

## 2017-08-16 MED ORDER — SERTRALINE HCL 100 MG PO TABS: 100.0000 mg | ORAL_TABLET | Freq: Every day | ORAL | 3 refills | Status: DC

## 2017-08-16 MED ORDER — OMEPRAZOLE 40 MG PO CPDR: 40.0000 mg | DELAYED_RELEASE_CAPSULE | Freq: Every day | ORAL | 3 refills | Status: DC

## 2017-08-16 MED ORDER — LISINOPRIL-HYDROCHLOROTHIAZIDE 10-12.5 MG PO TABS: 1.0000 | ORAL_TABLET | Freq: Every day | ORAL | 3 refills | Status: DC

## 2017-08-16 NOTE — Patient Instructions (Addendum)
Hiatal Hernia A hiatal hernia occurs when part of the stomach slides above the muscle that separates the abdomen from the chest (diaphragm). A person can be born with a hiatal hernia (congenital), or it may develop over time. In almost all cases of hiatal hernia, only the top part of the stomach pushes through the diaphragm. Many people have a hiatal hernia with no symptoms. The larger the hernia, the more likely it is that you will have symptoms. In some cases, a hiatal hernia allows stomach acid to flow back into the tube that carries food from your mouth to your stomach (esophagus). This may cause heartburn symptoms. Severe heartburn symptoms may mean that you have developed a condition called gastroesophageal reflux disease (GERD). What are the causes? This condition is caused by a weakness in the opening (hiatus) where the esophagus passes through the diaphragm to attach to the upper part of the stomach. A person may be born with a weakness in the hiatus, or a weakness can develop over time. What increases the risk? This condition is more likely to develop in:  Older people. Age is a major risk factor for a hiatal hernia, especially if you are over the age of 31.  Pregnant women.  People who are overweight.  People who have frequent constipation.  What are the signs or symptoms? Symptoms of this condition usually develop in the form of GERD symptoms. Symptoms include:  Heartburn.  Belching.  Indigestion.  Trouble swallowing.  Coughing or wheezing.  Sore throat.  Hoarseness.  Chest pain.  Nausea and vomiting.  How is this diagnosed? This condition may be diagnosed during testing for GERD. Tests that may be done include:  X-rays of your stomach or chest.  An upper gastrointestinal (GI) series. This is an X-ray exam of your GI tract that is taken after you swallow a chalky liquid that shows up clearly on the X-ray.  Endoscopy. This is a procedure to look into your  stomach using a thin, flexible tube that has a tiny camera and light on the end of it.  How is this treated? This condition may be treated by:  Dietary and lifestyle changes to help reduce GERD symptoms.  Medicines. These may include: ? Over-the-counter antacids. ? Medicines that make your stomach empty more quickly. ? Medicines that block the production of stomach acid (H2 blockers). ? Stronger medicines to reduce stomach acid (proton pump inhibitors).  Surgery to repair the hernia, if other treatments are not helping.  If you have no symptoms, you may not need treatment. Follow these instructions at home: Lifestyle and activity  Do not use any products that contain nicotine or tobacco, such as cigarettes and e-cigarettes. If you need help quitting, ask your health care provider.  Try to achieve and maintain a healthy body weight.  Avoid putting pressure on your abdomen. Anything that puts pressure on your abdomen increases the amount of acid that may be pushed up into your esophagus. ? Avoid bending over, especially after eating. ? Raise the head of your bed by putting blocks under the legs. This keeps your head and esophagus higher than your stomach. ? Do not wear tight clothing around your chest or stomach. ? Try not to strain when having a bowel movement, when urinating, or when lifting heavy objects. Eating and drinking  Avoid foods that can worsen GERD symptoms. These may include: ? Fatty foods, like fried foods. ? Citrus fruits, like oranges or lemon. ? Other foods and drinks that  contain acid, like orange juice or tomatoes. ? Spicy food. ? Chocolate.  Eat frequent small meals instead of three large meals a day. This helps prevent your stomach from getting too full. ? Eat slowly. ? Do not lie down right after eating. ? Do not eat 1-2 hours before bed.  Do not drink beverages with caffeine. These include cola, coffee, cocoa, and tea.  Do not drink alcohol. General  instructions  Take over-the-counter and prescription medicines only as told by your health care provider.  Keep all follow-up visits as told by your health care provider. This is important. Contact a health care provider if:  Your symptoms are not controlled with medicines or lifestyle changes.  You are having trouble swallowing.  You have coughing or wheezing that will not go away. Get help right away if:  Your pain is getting worse.  Your pain spreads to your arms, neck, jaw, teeth, or back.  You have shortness of breath.  You sweat for no reason.  You feel sick to your stomach (nauseous) or you vomit.  You vomit blood.  You have bright red blood in your stools.  You have black, tarry stools. This information is not intended to replace advice given to you by your health care provider. Make sure you discuss any questions you have with your health care provider. Document Released: 01/07/2004 Document Revised: 10/10/2016 Document Reviewed: 10/10/2016 Elsevier Interactive Patient Education  2018 [employer]. Hypertension Hypertension is another name for high blood pressure. High blood pressure forces your heart to work harder to pump blood. This can cause problems over time. There are two numbers in a blood pressure reading. There is a top number (systolic) over a bottom number (diastolic). It is best to have a blood pressure below 120/80. Healthy choices can help lower your blood pressure. You may need medicine to help lower your blood pressure if:  Your blood pressure cannot be lowered with healthy choices.  Your blood pressure is higher than 130/80.  Follow these instructions at home: Eating and drinking  If directed, follow the DASH eating plan. This diet includes: ? Filling half of your plate at each meal with fruits and vegetables. ? Filling one quarter of your plate at each meal with whole grains. Whole grains include whole wheat pasta, brown rice, and whole grain  bread. ? Eating or drinking low-fat dairy products, such as skim milk or low-fat yogurt. ? Filling one quarter of your plate at each meal with low-fat (lean) proteins. Low-fat proteins include fish, skinless chicken, eggs, beans, and tofu. ? Avoiding fatty meat, cured and processed meat, or chicken with skin. ? Avoiding premade or processed food.  Eat less than 1,500 mg of salt (sodium) a day.  Limit alcohol use to no more than 1 drink a day for nonpregnant women and 2 drinks a day for men. One drink equals 12 oz of beer, 5 oz of wine, or 1 oz of hard liquor. Lifestyle  Work with your doctor to stay at a healthy weight or to lose weight. Ask your doctor what the best weight is for you.  Get at least 30 minutes of exercise that causes your heart to beat faster (aerobic exercise) most days of the week. This may include walking, swimming, or biking.  Get at least 30 minutes of exercise that strengthens your muscles (resistance exercise) at least 3 days a week. This may include lifting weights or pilates.  Do not use any products that contain nicotine or tobacco.  This includes cigarettes and e-cigarettes. If you need help quitting, ask your doctor.  Check your blood pressure at home as told by your doctor.  Keep all follow-up visits as told by your doctor. This is important. Medicines  Take over-the-counter and prescription medicines only as told by your doctor. Follow directions carefully.  Do not skip doses of blood pressure medicine. The medicine does not work as well if you skip doses. Skipping doses also puts you at risk for problems.  Ask your doctor about side effects or reactions to medicines that you should watch for. Contact a doctor if:  You think you are having a reaction to the medicine you are taking.  You have headaches that keep coming back (recurring).  You feel dizzy.  You have swelling in your ankles.  You have trouble with your vision. Get help right away  if:  You get a very bad headache.  You start to feel confused.  You feel weak or numb.  You feel faint.  You get very bad pain in your: ? Chest. ? Belly (abdomen).  You throw up (vomit) more than once.  You have trouble breathing. Summary  Hypertension is another name for high blood pressure.  Making healthy choices can help lower blood pressure. If your blood pressure cannot be controlled with healthy choices, you may need to take medicine. This information is not intended to replace advice given to you by your health care provider. Make sure you discuss any questions you have with your health care provider. Document Released: 04/04/2008 Document Revised: 09/14/2016 Document Reviewed: 09/14/2016 Elsevier Interactive Patient Education  [employer].

## 2017-08-16 NOTE — Progress Notes (Signed)
John Mullins 46 y.o.   Chief Complaint  Patient presents with  . Medication Refill    patient presents to office requesting refills for lisinopril, prilosec and zoloft    HISTORY OF PRESENT ILLNESS: This is a 46 y.o. male here for medication refills; has no complaints or medical concerns. Requesting flu shot.  HPI   Prior to Admission medications   Medication Sig Start Date End Date Taking? Authorizing Provider  lisinopril-hydrochlorothiazide (PRINZIDE,ZESTORETIC) 10-12.5 MG tablet Take 1 tablet by mouth daily. Will need office visit for additional refills 03/11/17  Yes Weber, Sarah L, PA-C  omeprazole (PRILOSEC) 40 MG capsule Take 1 capsule (40 mg total) by mouth daily. 09/21/16  Yes Haney, Alyssa A, MD  sertraline (ZOLOFT) 100 MG tablet Take 1 tablet (100 mg total) by mouth daily. 09/21/16  Yes Haney, Alyssa A, MD  simvastatin (ZOCOR) 10 MG tablet Take 1 tablet (10 mg total) by mouth at bedtime. 09/21/16  Yes Haney, Alyssa A, MD  ipratropium (ATROVENT) 0.03 % nasal spray Place 2 sprays into both nostrils 2 (two) times daily. Patient not taking: Reported on 08/16/2017 10/13/14   Araceli Bouche, PA  permethrin (ELIMITE) 5 % cream Apply 1 application topically once. Patient not taking: Reported on 08/16/2017 01/23/16   Mancel Bale, PA-C    No Known Allergies  Patient Active Problem List   Diagnosis Date Noted  . Prediabetes 09/21/2016  . Morbid obesity due to excess calories ([place]) 09/01/2015  . Hypersomnia with sleep apnea 09/01/2015  . Snoring 09/01/2015  . HTN (hypertension) 06/04/2013  . Anxiety state, unspecified 06/04/2013  . GERD (gastroesophageal reflux disease) 06/04/2013  . Obesity, Class III, BMI 40-49.9 (morbid obesity) ([place]) 06/04/2013  . HIATAL HERNIA 11/28/2006    Past Medical History:  Diagnosis Date  . Anxiety   . GERD (gastroesophageal reflux disease)   . Hypertension     No past surgical history on file.  Social History   Social History  .  Marital status: Married    Spouse name: N/A  . Number of children: N/A  . Years of education: N/A   Occupational History  . Not on file.   Social History Main Topics  . Smoking status: Never Smoker  . Smokeless tobacco: Never Used  . Alcohol use No  . Drug use: No  . Sexual activity: Yes   Other Topics Concern  . Not on file   Social History Narrative  . No narrative on file    Family History  Problem Relation Age of Onset  . Heart disease Father   . Stroke Father   . Diabetes Father      Review of Systems  Constitutional: Negative.  Negative for chills and fever.  HENT: Negative.  Negative for congestion and sore throat.   Eyes: Negative.  Negative for blurred vision and double vision.  Respiratory: Negative.  Negative for cough and shortness of breath.   Cardiovascular: Negative.  Negative for chest pain and palpitations.  Gastrointestinal: Negative.  Negative for abdominal pain, blood in stool, diarrhea, melena, nausea and vomiting.  Genitourinary: Negative for dysuria, flank pain, frequency and hematuria.  Musculoskeletal: Negative.  Negative for myalgias and neck pain.  Skin: Negative.  Negative for rash.  Neurological: Negative.  Negative for dizziness and headaches.  Endo/Heme/Allergies: Negative.   All other systems reviewed and are negative.  Vitals:   08/16/17 1328  BP: 126/84  Pulse: (!) 110  Resp: 18  Temp: 98 F (36.7 C)  SpO2: 97%  Physical Exam  Constitutional: He is oriented to person, place, and time. He appears well-developed and well-nourished.  HENT:  Head: Normocephalic and atraumatic.  Nose: Nose normal.  Mouth/Throat: Oropharynx is clear and moist.  Eyes: Pupils are equal, round, and reactive to light. Conjunctivae and EOM are normal.  Neck: Normal range of motion. No JVD present. No thyromegaly present.  Cardiovascular: Normal rate, regular rhythm, normal heart sounds and intact distal pulses.   Pulmonary/Chest: Effort normal  and breath sounds normal.  Abdominal: Soft. Bowel sounds are normal. He exhibits no distension. There is no tenderness.  Musculoskeletal: Normal range of motion.  Lymphadenopathy:    He has no cervical adenopathy.  Neurological: He is alert and oriented to person, place, and time. No sensory deficit. He exhibits normal muscle tone.  Skin: Skin is warm and dry. Capillary refill takes less than 2 seconds. No rash noted.  Psychiatric: He has a normal mood and affect. His behavior is normal.  Vitals reviewed.  A total of 25 minutes was spent in the room with the patient, greater than 50% of which was in counseling/coordination of care regarding chronic medical conditions.   ASSESSMENT & PLAN: Javid was seen today for medication refill.  Diagnoses and all orders for this visit:  Essential hypertension -     lisinopril-hydrochlorothiazide (PRINZIDE,ZESTORETIC) 10-12.5 MG tablet; Take 1 tablet by mouth daily. Will need office visit for additional refills  Need for prophylactic vaccination and inoculation against influenza -     Flu Vaccine QUAD 36+ mos IM  Gastroesophageal reflux disease without esophagitis -     omeprazole (PRILOSEC) 40 MG capsule; Take 1 capsule (40 mg total) by mouth daily.  Encounter for medication refill  Other orders -     sertraline (ZOLOFT) 100 MG tablet; Take 1 tablet (100 mg total) by mouth daily.    Patient Instructions  Hiatal Hernia A hiatal hernia occurs when part of the stomach slides above the muscle that separates the abdomen from the chest (diaphragm). A person can be born with a hiatal hernia (congenital), or it may develop over time. In almost all cases of hiatal hernia, only the top part of the stomach pushes through the diaphragm. Many people have a hiatal hernia with no symptoms. The larger the hernia, the more likely it is that you will have symptoms. In some cases, a hiatal hernia allows stomach acid to flow back into the tube that carries  food from your mouth to your stomach (esophagus). This may cause heartburn symptoms. Severe heartburn symptoms may mean that you have developed a condition called gastroesophageal reflux disease (GERD). What are the causes? This condition is caused by a weakness in the opening (hiatus) where the esophagus passes through the diaphragm to attach to the upper part of the stomach. A person may be born with a weakness in the hiatus, or a weakness can develop over time. What increases the risk? This condition is more likely to develop in:  Older people. Age is a major risk factor for a hiatal hernia, especially if you are over the age of 15.  Pregnant women.  People who are overweight.  People who have frequent constipation.  What are the signs or symptoms? Symptoms of this condition usually develop in the form of GERD symptoms. Symptoms include:  Heartburn.  Belching.  Indigestion.  Trouble swallowing.  Coughing or wheezing.  Sore throat.  Hoarseness.  Chest pain.  Nausea and vomiting.  How is this diagnosed? This condition may be  diagnosed during testing for GERD. Tests that may be done include:  X-rays of your stomach or chest.  An upper gastrointestinal (GI) series. This is an X-ray exam of your GI tract that is taken after you swallow a chalky liquid that shows up clearly on the X-ray.  Endoscopy. This is a procedure to look into your stomach using a thin, flexible tube that has a tiny camera and light on the end of it.  How is this treated? This condition may be treated by:  Dietary and lifestyle changes to help reduce GERD symptoms.  Medicines. These may include: ? Over-the-counter antacids. ? Medicines that make your stomach empty more quickly. ? Medicines that block the production of stomach acid (H2 blockers). ? Stronger medicines to reduce stomach acid (proton pump inhibitors).  Surgery to repair the hernia, if other treatments are not helping.  If you  have no symptoms, you may not need treatment. Follow these instructions at home: Lifestyle and activity  Do not use any products that contain nicotine or tobacco, such as cigarettes and e-cigarettes. If you need help quitting, ask your health care provider.  Try to achieve and maintain a healthy body weight.  Avoid putting pressure on your abdomen. Anything that puts pressure on your abdomen increases the amount of acid that may be pushed up into your esophagus. ? Avoid bending over, especially after eating. ? Raise the head of your bed by putting blocks under the legs. This keeps your head and esophagus higher than your stomach. ? Do not wear tight clothing around your chest or stomach. ? Try not to strain when having a bowel movement, when urinating, or when lifting heavy objects. Eating and drinking  Avoid foods that can worsen GERD symptoms. These may include: ? Fatty foods, like fried foods. ? Citrus fruits, like oranges or lemon. ? Other foods and drinks that contain acid, like orange juice or tomatoes. ? Spicy food. ? Chocolate.  Eat frequent small meals instead of three large meals a day. This helps prevent your stomach from getting too full. ? Eat slowly. ? Do not lie down right after eating. ? Do not eat 1-2 hours before bed.  Do not drink beverages with caffeine. These include cola, coffee, cocoa, and tea.  Do not drink alcohol. General instructions  Take over-the-counter and prescription medicines only as told by your health care provider.  Keep all follow-up visits as told by your health care provider. This is important. Contact a health care provider if:  Your symptoms are not controlled with medicines or lifestyle changes.  You are having trouble swallowing.  You have coughing or wheezing that will not go away. Get help right away if:  Your pain is getting worse.  Your pain spreads to your arms, neck, jaw, teeth, or back.  You have shortness of  breath.  You sweat for no reason.  You feel sick to your stomach (nauseous) or you vomit.  You vomit blood.  You have bright red blood in your stools.  You have black, tarry stools. This information is not intended to replace advice given to you by your health care provider. Make sure you discuss any questions you have with your health care provider. Document Released: 01/07/2004 Document Revised: 10/10/2016 Document Reviewed: 10/10/2016 Elsevier Interactive Patient Education  2018 [employer]. Hypertension Hypertension is another name for high blood pressure. High blood pressure forces your heart to work harder to pump blood. This can cause problems over time. There are two  numbers in a blood pressure reading. There is a top number (systolic) over a bottom number (diastolic). It is best to have a blood pressure below 120/80. Healthy choices can help lower your blood pressure. You may need medicine to help lower your blood pressure if:  Your blood pressure cannot be lowered with healthy choices.  Your blood pressure is higher than 130/80.  Follow these instructions at home: Eating and drinking  If directed, follow the DASH eating plan. This diet includes: ? Filling half of your plate at each meal with fruits and vegetables. ? Filling one quarter of your plate at each meal with whole grains. Whole grains include whole wheat pasta, brown rice, and whole grain bread. ? Eating or drinking low-fat dairy products, such as skim milk or low-fat yogurt. ? Filling one quarter of your plate at each meal with low-fat (lean) proteins. Low-fat proteins include fish, skinless chicken, eggs, beans, and tofu. ? Avoiding fatty meat, cured and processed meat, or chicken with skin. ? Avoiding premade or processed food.  Eat less than 1,500 mg of salt (sodium) a day.  Limit alcohol use to no more than 1 drink a day for nonpregnant women and 2 drinks a day for men. One drink equals 12 oz of beer, 5 oz  of wine, or 1 oz of hard liquor. Lifestyle  Work with your doctor to stay at a healthy weight or to lose weight. Ask your doctor what the best weight is for you.  Get at least 30 minutes of exercise that causes your heart to beat faster (aerobic exercise) most days of the week. This may include walking, swimming, or biking.  Get at least 30 minutes of exercise that strengthens your muscles (resistance exercise) at least 3 days a week. This may include lifting weights or pilates.  Do not use any products that contain nicotine or tobacco. This includes cigarettes and e-cigarettes. If you need help quitting, ask your doctor.  Check your blood pressure at home as told by your doctor.  Keep all follow-up visits as told by your doctor. This is important. Medicines  Take over-the-counter and prescription medicines only as told by your doctor. Follow directions carefully.  Do not skip doses of blood pressure medicine. The medicine does not work as well if you skip doses. Skipping doses also puts you at risk for problems.  Ask your doctor about side effects or reactions to medicines that you should watch for. Contact a doctor if:  You think you are having a reaction to the medicine you are taking.  You have headaches that keep coming back (recurring).  You feel dizzy.  You have swelling in your ankles.  You have trouble with your vision. Get help right away if:  You get a very bad headache.  You start to feel confused.  You feel weak or numb.  You feel faint.  You get very bad pain in your: ? Chest. ? Belly (abdomen).  You throw up (vomit) more than once.  You have trouble breathing. Summary  Hypertension is another name for high blood pressure.  Making healthy choices can help lower blood pressure. If your blood pressure cannot be controlled with healthy choices, you may need to take medicine. This information is not intended to replace advice given to you by your  health care provider. Make sure you discuss any questions you have with your health care provider. Document Released: 04/04/2008 Document Revised: 09/14/2016 Document Reviewed: 09/14/2016 Elsevier Interactive Patient Education  2018  [employer]      Agustina Caroli, MD [facility]

## 2017-08-25 ENCOUNTER — Other Ambulatory Visit: Payer: Self-pay | Admitting: Family Medicine

## 2017-08-25 NOTE — Telephone Encounter (Signed)
simvastatin --forgot to ask for it when he was here please send to [facility]  Please take [facility] out of list list

## 2017-08-28 MED ORDER — SIMVASTATIN 10 MG PO TABS: 10.0000 mg | ORAL_TABLET | Freq: Every day | ORAL | 0 refills | Status: DC

## 2017-08-28 NOTE — Telephone Encounter (Signed)
Sent Rx to pharmacy  

## 2017-12-05 ENCOUNTER — Encounter: Payer: Self-pay | Admitting: Physician Assistant

## 2017-12-05 ENCOUNTER — Other Ambulatory Visit: Payer: Self-pay

## 2017-12-05 ENCOUNTER — Ambulatory Visit: Payer: BLUE CROSS/BLUE SHIELD | Admitting: Physician Assistant

## 2017-12-05 VITALS — BP 124/80 | HR 92 | Temp 98.5°F | Resp 18 | Ht 73.5 in | Wt 369.4 lb

## 2017-12-05 DIAGNOSIS — J069 Acute upper respiratory infection, unspecified: Secondary | ICD-10-CM

## 2017-12-05 MED ORDER — AZELASTINE HCL 0.1 % NA SOLN: 2.0000 | Freq: Two times a day (BID) | NASAL | 0 refills | Status: DC

## 2017-12-05 MED ORDER — HYDROCODONE-HOMATROPINE 5-1.5 MG/5ML PO SYRP: 5.0000 mL | ORAL_SOLUTION | Freq: Four times a day (QID) | ORAL | 0 refills | Status: DC | PRN

## 2017-12-05 NOTE — Progress Notes (Signed)
John Mullins  MRN: [id number] DOB: November 20, 1970  PCP: John Bale, PA-C  Chief Complaint  Patient presents with  . facial pressure    x6days   . Cough    Subjective:  Pt presents to clinic for cold symptoms that he has had for about 5-6 days.  Started with nasal congestion and now he has sinus congestion that is starting to turn yellow and he has some sinus pressure behind his nose.  He has a cough that is also productive with similar colored mucus.  Cough is keeping him up at night.    Wife was sick last week with similar symptoms.  History is obtained by patient.  Review of Systems  Constitutional: Negative for chills and fever.  HENT: Positive for congestion, postnasal drip and sinus pain. Negative for sore throat.   Respiratory: Positive for cough. Negative for shortness of breath and wheezing.        No h/o asthma, non- smoker  Gastrointestinal: Negative.   Musculoskeletal: Negative for myalgias.  Neurological: Positive for headaches (recent worsening).    Patient Active Problem List   Diagnosis Date Noted  . Prediabetes 09/21/2016  . Morbid obesity due to excess calories ([place]) 09/01/2015  . Hypersomnia with sleep apnea 09/01/2015  . Snoring 09/01/2015  . HTN (hypertension) 06/04/2013  . Anxiety state, unspecified 06/04/2013  . GERD (gastroesophageal reflux disease) 06/04/2013  . Obesity, Class III, BMI 40-49.9 (morbid obesity) ([place]) 06/04/2013  . HIATAL HERNIA 11/28/2006    Current Outpatient Medications on File Prior to Visit  Medication Sig Dispense Refill  . lisinopril-hydrochlorothiazide (PRINZIDE,ZESTORETIC) 10-12.5 MG tablet Take 1 tablet by mouth daily. Will need office visit for additional refills 90 tablet 3  . omeprazole (PRILOSEC) 40 MG capsule Take 1 capsule (40 mg total) by mouth daily. 90 capsule 3  . permethrin (ELIMITE) 5 % cream Apply 1 application topically once. 60 g 1  . sertraline (ZOLOFT) 100 MG tablet Take 1 tablet (100 mg total) by  mouth daily. 90 tablet 3  . simvastatin (ZOCOR) 10 MG tablet Take 1 tablet (10 mg total) by mouth at bedtime. 90 tablet 0   No current facility-administered medications on file prior to visit.     No Known Allergies  Past Medical History:  Diagnosis Date  . Anxiety   . GERD (gastroesophageal reflux disease)   . Hypertension    Social History   Social History Narrative  . Not on file   Social History   Tobacco Use  . Smoking status: Never Smoker  . Smokeless tobacco: Never Used  Substance Use Topics  . Alcohol use: No  . Drug use: No   family history includes Diabetes in his father; Heart disease in his father; Stroke in his father.     Objective:  BP 124/80   Pulse 92   Temp 98.5 F (36.9 C) (Oral)   Resp 18   Ht 6' 1.5" (1.867 m)   Wt (!) 369 lb 6.4 oz (167.6 kg)   SpO2 96%   BMI 48.08 kg/m  Body mass index is 48.08 kg/m.  Physical Exam  Constitutional: He is oriented to person, place, and time and well-developed, well-nourished, and in no distress.  HENT:  Head: Normocephalic and atraumatic.  Right Ear: Hearing, tympanic membrane, external ear and ear canal normal.  Left Ear: Hearing, tympanic membrane, external ear and ear canal normal.  Nose: Mucosal edema: left > right.  Mouth/Throat: Uvula is midline, oropharynx is clear and moist and  mucous membranes are normal.  Eyes: Conjunctivae are normal.  Neck: Normal range of motion.  Cardiovascular: Normal rate, regular rhythm and normal heart sounds.  Pulmonary/Chest: Effort normal and breath sounds normal. He has no wheezes.  Lymphadenopathy:       Head (right side): No tonsillar adenopathy present.       Head (left side): No tonsillar adenopathy present.    He has no cervical adenopathy.       Right: No supraclavicular adenopathy present.       Left: No supraclavicular adenopathy present.  Neurological: He is alert and oriented to person, place, and time. Gait normal.  Skin: Skin is warm and dry.    Psychiatric: Mood, memory, affect and judgment normal.    Assessment and Plan :  URI with cough and congestion - Plan: azelastine (ASTELIN) 0.1 % nasal spray, HYDROcodone-homatropine (HYCODAN) 5-1.5 MG/5ML syrup  Symptomatic treatment.  Let me know in a week if no better because an abx might be warranted but suspect he will be improved at that time.  Windell Hummingbird PA-C  [facility] 12/05/2017 5:47 PM

## 2017-12-05 NOTE — Patient Instructions (Addendum)
  Please push fluids.  Tylenol and Motrin for fever and body aches.    A humidifier can help especially when the air is dry -if you do not have a humidifier you can boil a pot of water on the stove in your home to help with the dry air.  Nasal saline spray can be helpful to keep the mucus membranes moist and thin the nasal mucus    IF you received an x-ray today, you will receive an invoice from [facility]. Please contact [facility] at [phone] with questions or concerns regarding your invoice.   IF you received labwork today, you will receive an invoice from [facility]. Please contact [facility] at [phone] with questions or concerns regarding your invoice.   Our billing staff will not be able to assist you with questions regarding bills from these companies.  You will be contacted with the lab results as soon as they are available. The fastest way to get your results is to activate your My Chart account. Instructions are located on the last page of this paperwork. If you have not heard from us regarding the results in 2 weeks, please contact this office.      

## 2018-01-26 ENCOUNTER — Other Ambulatory Visit: Payer: Self-pay

## 2018-01-26 ENCOUNTER — Ambulatory Visit: Payer: BLUE CROSS/BLUE SHIELD | Admitting: Physician Assistant

## 2018-01-26 ENCOUNTER — Encounter: Payer: Self-pay | Admitting: Physician Assistant

## 2018-01-26 VITALS — BP 116/68 | HR 96 | Temp 98.0°F | Resp 20 | Ht 74.25 in | Wt 372.2 lb

## 2018-01-26 DIAGNOSIS — R0683 Snoring: Secondary | ICD-10-CM | POA: Diagnosis not present

## 2018-01-26 DIAGNOSIS — E7849 Other hyperlipidemia: Secondary | ICD-10-CM | POA: Diagnosis not present

## 2018-01-26 DIAGNOSIS — K219 Gastro-esophageal reflux disease without esophagitis: Secondary | ICD-10-CM | POA: Diagnosis not present

## 2018-01-26 DIAGNOSIS — J019 Acute sinusitis, unspecified: Secondary | ICD-10-CM | POA: Diagnosis not present

## 2018-01-26 DIAGNOSIS — R7303 Prediabetes: Secondary | ICD-10-CM | POA: Diagnosis not present

## 2018-01-26 DIAGNOSIS — I10 Essential (primary) hypertension: Secondary | ICD-10-CM | POA: Diagnosis not present

## 2018-01-26 DIAGNOSIS — J069 Acute upper respiratory infection, unspecified: Secondary | ICD-10-CM

## 2018-01-26 MED ORDER — AMOXICILLIN 875 MG PO TABS: 875.0000 mg | ORAL_TABLET | Freq: Two times a day (BID) | ORAL | 0 refills | Status: DC

## 2018-01-26 NOTE — Progress Notes (Signed)
John Mullins  MRN: [id number] DOB: 1971-06-08  PCP: Mancel Bale, PA-C  Chief Complaint  Patient presents with  . Facial Pain    X 3-4 days  . Headache    X 1 day  . Nasal Congestion    Subjective:  Pt presents to clinic for cold symptoms that started 4 days ago.  Symptoms started with runny nose and now he has sinus pressure, trouble breathing due to sinus pressure and slight headache.  He has PND with resulting sore throat.  He feels like he really never got over what he has last month. He has been using nasacort for the last days but he is having trouble getting the spray up in the nose - sudafed is not helping the sinus congestion so he feels like he can breath.  The last 3 weeks heartburn and foul smelling burps and flatulence.  He is on prilosec daily and still having breakthrough heartburn.  Terrible snoring stops breathing at night - he has needed a sleep study and has never had it- they are ready to have that done now. He is trying to eat better - he has always eaten a good amount of veggies but he overeats - he would like to lose some weight.   He has toenail fungus on all 10 toes - in the past his LFts were high so he could not get treatment - his daughter now has it and they think it is because they share the same shower.  They are not painful to him.  History is obtained by patient and wife.  Review of Systems  Constitutional: Negative for chills and fever.  HENT: Positive for congestion, postnasal drip, rhinorrhea (yellow), sinus pain and sore throat (from PND).   Respiratory: Positive for cough (rare). Negative for shortness of breath and wheezing.        Nonsmoker without h/o asthma  Psychiatric/Behavioral: Positive for sleep disturbance (snores and has pauses in breathing).    Patient Active Problem List   Diagnosis Date Noted  . Prediabetes 09/21/2016  . Morbid obesity due to excess calories ([place]) 09/01/2015  . Hypersomnia with sleep apnea 09/01/2015  .  Snoring 09/01/2015  . HTN (hypertension) 06/04/2013  . Anxiety state, unspecified 06/04/2013  . GERD (gastroesophageal reflux disease) 06/04/2013  . Obesity, Class III, BMI 40-49.9 (morbid obesity) ([place]) 06/04/2013  . HIATAL HERNIA 11/28/2006    Current Outpatient Medications on File Prior to Visit  Medication Sig Dispense Refill  . lisinopril-hydrochlorothiazide (PRINZIDE,ZESTORETIC) 10-12.5 MG tablet Take 1 tablet by mouth daily. Will need office visit for additional refills 90 tablet 3  . omeprazole (PRILOSEC) 40 MG capsule Take 1 capsule (40 mg total) by mouth daily. 90 capsule 3  . sertraline (ZOLOFT) 100 MG tablet Take 1 tablet (100 mg total) by mouth daily. 90 tablet 3   No current facility-administered medications on file prior to visit.     No Known Allergies  Past Medical History:  Diagnosis Date  . Anxiety   . GERD (gastroesophageal reflux disease)   . Hypertension    Social History   Social History Narrative  . Not on file   Social History   Tobacco Use  . Smoking status: Never Smoker  . Smokeless tobacco: Never Used  Substance Use Topics  . Alcohol use: No  . Drug use: No   family history includes Diabetes in his father; Heart disease in his father; Stroke in his father.     Objective:  BP  116/68 (BP Location: Left Arm, Patient Position: Sitting, Cuff Size: Large)   Pulse 96   Temp 98 F (36.7 C) (Oral)   Resp 20   Ht 6' 2.25" (1.886 m)   Wt (!) 372 lb 3.2 oz (168.8 kg)   SpO2 98%   BMI 47.46 kg/m  Body mass index is 47.46 kg/m.  Physical Exam  Constitutional: He is oriented to person, place, and time and well-developed, well-nourished, and in no distress.  HENT:  Head: Normocephalic and atraumatic.  Right Ear: External ear normal.  Left Ear: External ear normal.  Eyes: Conjunctivae are normal.  Neck: Normal range of motion.  Cardiovascular: Normal rate, regular rhythm and normal heart sounds.  No murmur heard. Pulmonary/Chest: Effort  normal and breath sounds normal. He has no wheezes.  Neurological: He is alert and oriented to person, place, and time. Gait normal.  Skin: Skin is warm and dry.  Psychiatric: Mood, memory, affect and judgment normal.    Assessment and Plan :  Essential hypertension - well controlled even with taking sudafed -   Gastroesophageal reflux disease without esophagitis - Plan: H. pylori breath test  -he has to be off PPI for 2 weeks before this test can be done - he will RTC in 2 weeks to have this done  URI with cough and congestion -   Prediabetes - Plan: CMP14+EGFR, Hemoglobin A1c - check labs not done in about a year  Other hyperlipidemia - Plan: Lipid panel - 5 hours fasting - has not had done in a year  Acute non-recurrent sinusitis, unspecified location - Plan: amoxicillin (AMOXIL) 875 MG tablet - pt has been sick for about 2 months but got significantly worse in the last 3-4 days - unsure if new illness but with his symptoms treat for sinus infection  Snoring - Plan: Ambulatory referral to Sleep Studies - suspect sleep apnea  Windell Hummingbird PA-C  [facility] 01/26/2018 6:46 PM

## 2018-01-26 NOTE — Patient Instructions (Addendum)
Please download the APP called mychart - then use the text to activate this APP - this will allow you to look at your labs and contact me as well as make appointments to see me in the future.   Please stop prilosec for the next 2 weeks in order to have the breath test.  You can use tums    IF you received an x-ray today, you will receive an invoice from [facility]. Please contact [facility] at [phone] with questions or concerns regarding your invoice.   IF you received labwork today, you will receive an invoice from [facility]. Please contact [facility] at [phone] with questions or concerns regarding your invoice.   Our billing staff will not be able to assist you with questions regarding bills from these companies.  You will be contacted with the lab results as soon as they are available. The fastest way to get your results is to activate your My Chart account. Instructions are located on the last page of this paperwork. If you have not heard from [place] regarding the results in 2 weeks, please contact this office.

## 2018-01-27 LAB — CMP14+EGFR
A/G RATIO: 1.5 (ref 1.2–2.2)
ALK PHOS: 122 IU/L — AB (ref 39–117)
ALT: 49 IU/L — AB (ref 0–44)
AST: 27 IU/L (ref 0–40)
Albumin: 4.3 g/dL (ref 3.5–5.5)
BUN/Creatinine Ratio: 12 (ref 9–20)
BUN: 9 mg/dL (ref 6–24)
CHLORIDE: 102 mmol/L (ref 96–106)
CO2: 22 mmol/L (ref 20–29)
Calcium: 9.1 mg/dL (ref 8.7–10.2)
Creatinine, Ser: 0.78 mg/dL (ref 0.76–1.27)
GFR calc non Af Amer: 107 mL/min/{1.73_m2} (ref >59)
GFR, EST AFRICAN AMERICAN: 124 mL/min/{1.73_m2} (ref >59)
GLUCOSE: 137 mg/dL — AB (ref 65–99)
Globulin, Total: 2.9 g/dL (ref 1.5–4.5)
POTASSIUM: 3.8 mmol/L (ref 3.5–5.2)
Sodium: 141 mmol/L (ref 134–144)
TOTAL PROTEIN: 7.2 g/dL (ref 6.0–8.5)

## 2018-01-27 LAB — HEMOGLOBIN A1C
Est. average glucose Bld gHb Est-mCnc: 143 mg/dL
HEMOGLOBIN A1C: 6.6 % — AB (ref 4.8–5.6)

## 2018-01-27 LAB — LIPID PANEL
CHOL/HDL RATIO: 6.2 ratio — AB (ref 0.0–5.0)
CHOLESTEROL TOTAL: 173 mg/dL (ref 100–199)
HDL: 28 mg/dL — ABNORMAL LOW (ref >39)
LDL CALC: 97 mg/dL (ref 0–99)
Triglycerides: 238 mg/dL — ABNORMAL HIGH (ref 0–149)
VLDL Cholesterol Cal: 48 mg/dL — ABNORMAL HIGH (ref 5–40)

## 2018-01-30 ENCOUNTER — Telehealth: Payer: Self-pay | Admitting: *Deleted

## 2018-01-30 NOTE — Telephone Encounter (Signed)
John Mullins spoke with patient he said to call all medications in for [facility] to [facility] [address].    Was transferred to schedule a follow up.

## 2018-01-31 MED ORDER — METFORMIN HCL ER 500 MG PO TB24: 1000.0000 mg | ORAL_TABLET | Freq: Every day | ORAL | 0 refills | Status: DC

## 2018-01-31 MED ORDER — TERBINAFINE HCL 250 MG PO TABS: 250.0000 mg | ORAL_TABLET | Freq: Every day | ORAL | 0 refills | Status: DC

## 2018-01-31 MED ORDER — ROSUVASTATIN CALCIUM 10 MG PO TABS: 10.0000 mg | ORAL_TABLET | Freq: Every day | ORAL | 0 refills | Status: DC

## 2018-01-31 NOTE — Telephone Encounter (Signed)
All sent to pharmacy.

## 2018-02-08 ENCOUNTER — Encounter (INDEPENDENT_AMBULATORY_CARE_PROVIDER_SITE_OTHER): Payer: Self-pay

## 2018-02-09 ENCOUNTER — Ambulatory Visit: Payer: BLUE CROSS/BLUE SHIELD

## 2018-02-09 DIAGNOSIS — K219 Gastro-esophageal reflux disease without esophagitis: Secondary | ICD-10-CM

## 2018-02-12 LAB — H. PYLORI BREATH TEST: H PYLORI BREATH TEST: NEGATIVE

## 2018-03-02 ENCOUNTER — Ambulatory Visit: Payer: BLUE CROSS/BLUE SHIELD | Admitting: Physician Assistant

## 2018-03-02 ENCOUNTER — Encounter: Payer: Self-pay | Admitting: Physician Assistant

## 2018-03-02 VITALS — BP 126/82 | HR 96 | Temp 98.0°F | Resp 16 | Ht 74.41 in | Wt 372.0 lb

## 2018-03-02 DIAGNOSIS — B351 Tinea unguium: Secondary | ICD-10-CM

## 2018-03-02 DIAGNOSIS — I10 Essential (primary) hypertension: Secondary | ICD-10-CM

## 2018-03-02 DIAGNOSIS — R945 Abnormal results of liver function studies: Secondary | ICD-10-CM | POA: Diagnosis not present

## 2018-03-02 DIAGNOSIS — K219 Gastro-esophageal reflux disease without esophagitis: Secondary | ICD-10-CM | POA: Diagnosis not present

## 2018-03-02 DIAGNOSIS — E7849 Other hyperlipidemia: Secondary | ICD-10-CM

## 2018-03-02 DIAGNOSIS — R7989 Other specified abnormal findings of blood chemistry: Secondary | ICD-10-CM

## 2018-03-02 NOTE — Patient Instructions (Signed)
     IF you received an x-ray today, you will receive an invoice from [facility]. Please contact [facility] at [phone] with questions or concerns regarding your invoice.   IF you received labwork today, you will receive an invoice from [facility]. Please contact [facility] at [phone] with questions or concerns regarding your invoice.   Our billing staff will not be able to assist you with questions regarding bills from these companies.  You will be contacted with the lab results as soon as they are available. The fastest way to get your results is to activate your My Chart account. Instructions are located on the last page of this paperwork. If you have not heard from us regarding the results in 2 weeks, please contact this office.     

## 2018-03-02 NOTE — Progress Notes (Signed)
John Mullins  MRN: [id number] DOB: June 14, 1971  PCP: Mancel Bale, PA-C  Chief Complaint  Patient presents with  . Hypertension    1 month follow-up   . Gastroesophageal Reflux    Subjective:  Pt presents to clinic for recheck of his medical problems  He is tolerating the medications for DM and cholesterol without problems.  When he went off the prilosec for the H pylori test the symptoms of reflux returned - his symptoms were so bad that even tums would not help his symptoms.  While on the prilosec his symptoms resolve. He feels like when he is off the prilosec he starts to have problems with his hiatal hernia.  Using lamasil for his toenail fungus and he is tolertaing it ok - he thinks his toenails have started to clear at the base - he hopes he can finish the treatment  He has a sleep study consult in June  History is obtained by patient.  Review of Systems  Constitutional: Negative for chills and fever.  Gastrointestinal: Negative for abdominal pain and nausea.    Patient Active Problem List   Diagnosis Date Noted  . Prediabetes 09/21/2016  . Morbid obesity due to excess calories ([place]) 09/01/2015  . Hypersomnia with sleep apnea 09/01/2015  . Snoring 09/01/2015  . HTN (hypertension) 06/04/2013  . Anxiety state, unspecified 06/04/2013  . GERD (gastroesophageal reflux disease) 06/04/2013  . Obesity, Class III, BMI 40-49.9 (morbid obesity) ([place]) 06/04/2013  . HIATAL HERNIA 11/28/2006    Current Outpatient Medications on File Prior to Visit  Medication Sig Dispense Refill  . lisinopril-hydrochlorothiazide (PRINZIDE,ZESTORETIC) 10-12.5 MG tablet Take 1 tablet by mouth daily. Will need office visit for additional refills 90 tablet 3  . metFORMIN (GLUCOPHAGE XR) 500 MG 24 hr tablet Take 2 tablets (1,000 mg total) by mouth daily with breakfast. 180 tablet 0  . omeprazole (PRILOSEC) 40 MG capsule Take 1 capsule (40 mg total) by mouth daily. 90 capsule 3  . rosuvastatin  (CRESTOR) 10 MG tablet Take 1 tablet (10 mg total) by mouth daily. 90 tablet 0  . sertraline (ZOLOFT) 100 MG tablet Take 1 tablet (100 mg total) by mouth daily. 90 tablet 3  . terbinafine (LAMISIL) 250 MG tablet Take 1 tablet (250 mg total) by mouth daily. 30 tablet 0   No current facility-administered medications on file prior to visit.     No Known Allergies  Past Medical History:  Diagnosis Date  . Anxiety   . GERD (gastroesophageal reflux disease)   . Hypertension    Social History   Social History Narrative  . Not on file   Social History   Tobacco Use  . Smoking status: Never Smoker  . Smokeless tobacco: Never Used  Substance Use Topics  . Alcohol use: No  . Drug use: No   family history includes Diabetes in his father; Heart disease in his father; Stroke in his father.     Objective:  BP 126/82   Pulse 96   Temp 98 F (36.7 C) (Oral)   Resp 16   Ht 6' 2.41" (1.89 m)   Wt (!) 372 lb (168.7 kg)   SpO2 100%   BMI 47.24 kg/m  Body mass index is 47.24 kg/m.  Physical Exam  Constitutional: He is oriented to person, place, and time. He appears well-developed and well-nourished.  HENT:  Head: Normocephalic and atraumatic.  Right Ear: External ear normal.  Left Ear: External ear normal.  Eyes: Conjunctivae are  normal.  Neck: Normal range of motion.  Cardiovascular: Normal rate, regular rhythm and normal heart sounds.  No murmur heard. Pulmonary/Chest: Effort normal and breath sounds normal. He has no wheezes.  Neurological: He is alert and oriented to person, place, and time.  Skin: Skin is warm and dry.  Psychiatric: He has a normal mood and affect. His behavior is normal. Judgment and thought content normal.  Vitals reviewed.   Assessment and Plan :  Onychomycosis - Plan: Hepatic Function Panel - pt feels that this is better - if his LFT are the same we will continue for a total of 12 weeks.    Elevated LFTs - Plan: Hepatic Function Panel - check labs  for monitoring  Essential hypertension - controlled on medications  Gastroesophageal reflux disease without esophagitis - controlled on medications  Other hyperlipidemia  Windell Hummingbird PA-C  [facility] 03/02/2018 11:05 AM

## 2018-03-03 LAB — HEPATIC FUNCTION PANEL
ALBUMIN: 4.2 g/dL (ref 3.5–5.5)
ALT: 36 IU/L (ref 0–44)
AST: 23 IU/L (ref 0–40)
Alkaline Phosphatase: 115 IU/L (ref 39–117)
Bilirubin Total: 0.3 mg/dL (ref 0.0–1.2)
Bilirubin, Direct: 0.11 mg/dL (ref 0.00–0.40)
TOTAL PROTEIN: 6.7 g/dL (ref 6.0–8.5)

## 2018-03-06 MED ORDER — TERBINAFINE HCL 250 MG PO TABS: 250.0000 mg | ORAL_TABLET | Freq: Every day | ORAL | 1 refills | Status: DC

## 2018-03-06 NOTE — Addendum Note (Signed)
Addended by: Mancel Bale on: 03/06/2018 08:52 AM   Modules accepted: Orders

## 2018-03-22 ENCOUNTER — Encounter: Payer: Self-pay | Admitting: Family Medicine

## 2018-04-04 ENCOUNTER — Ambulatory Visit (INDEPENDENT_AMBULATORY_CARE_PROVIDER_SITE_OTHER): Payer: BLUE CROSS/BLUE SHIELD | Admitting: Neurology

## 2018-04-04 ENCOUNTER — Encounter: Payer: Self-pay | Admitting: Neurology

## 2018-04-04 VITALS — BP 128/81 | HR 100 | Ht 74.0 in | Wt 374.0 lb

## 2018-04-04 DIAGNOSIS — R0683 Snoring: Secondary | ICD-10-CM | POA: Diagnosis not present

## 2018-04-04 DIAGNOSIS — G4730 Sleep apnea, unspecified: Secondary | ICD-10-CM

## 2018-04-04 DIAGNOSIS — E662 Morbid (severe) obesity with alveolar hypoventilation: Secondary | ICD-10-CM | POA: Diagnosis not present

## 2018-04-04 DIAGNOSIS — Z6841 Body Mass Index (BMI) 40.0 and over, adult: Secondary | ICD-10-CM

## 2018-04-04 DIAGNOSIS — G4710 Hypersomnia, unspecified: Secondary | ICD-10-CM | POA: Diagnosis not present

## 2018-04-04 NOTE — Progress Notes (Signed)
SLEEP MEDICINE CLINIC   Provider:  Larey Seat, M D  Referring Provider: Mittie Bodo Primary Care Physician:    Chief Complaint  Patient presents with  . New Patient (Initial Visit)    rm 10, alone, has never had a sleep study but does endorse snoring.    HPI:  John Mullins is a 47 y.o. male , seen here as a referral from NP Weber from [facility] for a sleep consultation.  I had the pleasure of seeing John Mullins today in a revisit, he had been seen in a sleep consultation on 01 September 2015, 2.5 years ago.   Today is 04 April 2018 the patient reports that his insurance at the time was with a high deductible plan and he could not afford $1500 out-of-pocket to go through with a sleep study that I recommended.  In the meantime his insurance has not changed, he still uses a [employer] through his employer. The patient reports that defecation he took with his family was cut short because nobody could share hotel bedroom with him.  His snoring seems to have progressed.    In the meantime the patient has not been able to reduce his BMI , weight was gained, he carries a diagnosis of morbid obesity over 45 , prediabetes, worsening snoring and progression of hypersomnia suspected to reflect sleep apnea. He has also been found to have elevated liver function tests, transaminases are monitored by primary care, ( nail fungus medication was likely the cause) he has esophagitis related to acid reflux but has less symptoms on the current medication.  Still has  hypertension but controlled, anxiety reflux disorder, and a history of hiatal hernia.  His medications have changed slightly and are listed below.    His family history still includes diabetes , heart disease, and stroke in his father.     Last visit 09-01-2015 [name] stated that the patient has been obese for many years and reported to have had a normal body weight the last time but he  was in the [employer]. His wife has noted him to snore loudly and she has witnessed periods of nocturnal apnea. The patient himself states that he does not feel that he has restorative or refreshing sleep. 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 He sometimes skips lunch.    Sleep habits are as follows: 04-04-2018, The patient works until 3:30 PM and usually reaches home by about 4:30 PM . At that time he often feels fatigued and he likes to nap. According to the referral notes he often naps, especially on week ends . By 9:30- 10 PM he usually goes to bed,  but may have already had a sleep time of 2-3 hours. The bedroom is cool, quiet and dark. Ceiling fan is on. He sleeps on his sides, with 2 pillows.  Once in bed he falls asleep promptly again.  He prefers to sleep on his side. He no longer shares a bedroom with his wife, who was very bothered by the snoring. He is restless.  Once or twice at night he will have to get up and urinate, 3 times at night . He has to rise at about 5.30 AM, and usually wakes up spontaneously.  Even on the weekends he  will wake up at the same time. He does have an alarm in the background but does not rely on it.  Social history, lives with wife and daughter, He drinks caffeinated sodas ( no coffee) during the day and afternoon, sweet iced tea when eating out.  He usually does not take breakfast at home before going to work. His work is indoors/ outdoors and he has access to natural daylight.  Sleep medical history and family sleep history:  The patient was diagnosed with deviated nasal septum, he does not have any ENT surgical history, nor facial injuries or  neck injuries. He never underwent a tonsillectomy or adenoidectomy.  His father use to snore, his brother is a snorer. He is not sure if that ever been diagnosed or worked  up for sleep apnea. His weight gain has been gradual over the years. Social history: Married with child, one child- a daughter, age 40- graduating from [employer] this Friday, heading to [employer].    Review of Systems: Out of a complete 14 system review, the patient complains of only the following symptoms, and all other reviewed systems are negative. Endorsed were snoring, daytime napping increased need to sleep.  Fatigue severity score was endorsed at 45 points and the Epworth sleepiness score at 16/ 24 points.     Social History   Socioeconomic History  . Marital status: Married    Spouse name: Not on file  . Number of children: 1  . Years of education: Not on file  . Highest education level: Not on file  Occupational History  . Not on file  Social Needs  . Financial resource strain: Not on file  . Food insecurity:    Worry: Not on file    Inability: Not on file  . Transportation needs:    Medical: Not on file    Non-medical: Not on file  Tobacco Use  . Smoking status: Never Smoker  . Smokeless tobacco: Never Used  Substance and Sexual Activity  . Alcohol use: No  . Drug use: No  . Sexual activity: Yes  Lifestyle  . Physical activity:    Days per week: Not on file    Minutes per session: Not on file  . Stress: Not on file  Relationships  . Social connections:    Talks on phone: Not on file    Gets together: Not on file    Attends religious service: Not on file    Active member of club or organization: Not on file    Attends meetings of clubs or organizations: Not on file    Relationship status: Not on file  . Intimate partner violence:    Fear of current or ex partner: Not on file    Emotionally abused: Not on file    Physically abused: Not on file    Forced sexual activity: Not on file  Other Topics Concern  . Not on file  Social History Narrative  . Not on file    Family History  Problem Relation Age of Onset  . Heart disease Father   . Stroke  Father   . Diabetes Father     Past Medical History:  Diagnosis Date  . Anxiety   . GERD (gastroesophageal reflux disease)   . Hypertension     No past surgical history on file.  Current Outpatient Medications  Medication Sig Dispense Refill  . lisinopril-hydrochlorothiazide (PRINZIDE,ZESTORETIC) 10-12.5 MG tablet Take 1 tablet by mouth daily. Will need office visit for additional  refills 90 tablet 3  . metFORMIN (GLUCOPHAGE XR) 500 MG 24 hr tablet Take 2 tablets (1,000 mg total) by mouth daily with breakfast. 180 tablet 0  . omeprazole (PRILOSEC) 40 MG capsule Take 1 capsule (40 mg total) by mouth daily. 90 capsule 3  . rosuvastatin (CRESTOR) 10 MG tablet Take 1 tablet (10 mg total) by mouth daily. 90 tablet 0  . sertraline (ZOLOFT) 100 MG tablet Take 1 tablet (100 mg total) by mouth daily. 90 tablet 3  . terbinafine (LAMISIL) 250 MG tablet Take 1 tablet (250 mg total) by mouth daily. 30 tablet 1   No current facility-administered medications for this visit.     Allergies as of 04/04/2018  . (No Known Allergies)    Vitals: BP 128/81   Pulse 100   Ht 6\' 2"  (1.88 m)   Wt (!) 374 lb (169.6 kg)   BMI 48.02 kg/m  Last Weight:  Wt Readings from Last 1 Encounters:  04/04/18 (!) 374 lb (169.6 kg)   WVP:XTGG mass index is 48.02 kg/m.     Last Height:   Ht Readings from Last 1 Encounters:  04/04/18 6\' 2"  (1.88 m)    Physical exam:  General: The patient is awake, alert and appears not in acute distress.  Head: Normocephalic, atraumatic. Neck is supple. Mallampati 5.   neck circumference up to 20.5" from 19.25"   Nasal airflow restricted in the right, open in the left, TMJ not  evident . Crowded lower jaw dentition. Large tongue.  Cardiovascular:  Regular rate and rhythm , without murmurs or carotid bruit, and without distended neck veins. Respiratory: Lungs are clear to auscultation. Skin:  Without evidence of edema, or rash Trunk: BMI is elevated  Neurologic exam : The  patient is awake and alert, oriented to place and time. Attention span & concentration ability appears normal.  Speech is fluent,  without dysarthria, with mild dysphonia .  Mood and affect are appropriate.  Cranial nerves: Pupils are equal and briskly reactive to light. Funduscopic exam without  evidence of pallor or edema.Extraocular movements  in vertical and horizontal planes intact and without nystagmus. Visual fields by finger perimetry are intact. Hearing to finger rub intact.  Facial sensation intact to fine touch. Facial motor strength is symmetric and tongue and uvula move midline. Shoulder shrug was symmetrical.   Motor exam:  Normal tone, muscle bulk and symmetric strength in all extremities. The patient provides a symmetric and strong grip.  There is no evidence of cogwheeling or decreased range of motion at any joints. Sensory:  Fine touch, pinprick and vibration were normal. Coordination: Rapid alternating movements/  Finger-to-nose maneuver  normal without evidence of ataxia, dysmetria or tremor. Gait and station: Patient walks without assistive device . Strength within normal limits. Stance is wider based - Turns with 3 Steps. Romberg testing is negative.  Deep tendon reflexes: in the upper and lower extremities are brisk and symmetric.  The patient was advised of the nature of the diagnosed sleep disorder , the treatment options and risks for general a health and wellness arising from not treating the condition. I spent more than 30 minutes of face to face time with the patient. Greater than 50% of time was spent in counseling and coordination of care. We have discussed the diagnosis and differential and I answered the patient's questions.    Assessment:  After physical and neurologic examination, review of laboratory studies,  Personal review of imaging studies, reports of other /same  Imaging studies ,  Results of polysomnography/ neurophysiology testing and pre-existing records  as far as provided in visit., my assessment is   1) Mr. Felch describes excessive daytime sleepiness and fatigue -  in spite of having usually more than 7 hours of nocturnal sleep he often naps additionally 2 or 3 hours.  He describes struggling to stay awake and alert after his workday has concluded.  His wife has noted him to snore and she has witnessed apneas. All this contributes to a high index of suspicion for obstructive sleep apnea.   2) morbid obesity with BMI 47-48  is the main risk factor , in conjunction with Mallampati 5,  neck circumference of 20.5  Inches- the BMI and neck size have increased since last seen. orthopnea - sleeps on 2 fat pillows.   3) DM 2- not well controlled. A low-carb diet would be important, he continues to drink sodas and sweet tea. He is also encouraged to have more physical activity. He feels it is not feasible for him to exercise after work.   Plan:  Treatment plan and additional workup : I will order an attended split night polysomnography for this patient. He does deny a dry mouth or headaches in the morning so capnography is not needed unless available.  I will order an attended sleep study with an AHI of 20 and scored for 3% oxygen desaturation.  If not possible to obtain attended study due to insurance, would go for HST.    Reports multiple times of  snoring himself awake or feeling air hungry. Currently is not his main allergy season so would like for him to be tested now before he develops rhinitis sinusitis or bronchitis. I will follow up with the patient after the sleep study is concluded .      Asencion Partridge Lorelie Biermann MD  04/04/2018   CC: [address], [address] [place], [place] [address]

## 2018-05-08 ENCOUNTER — Other Ambulatory Visit: Payer: Self-pay | Admitting: Physician Assistant

## 2018-05-08 NOTE — Telephone Encounter (Signed)
Refill requests:   Metformin; last refill 01/31/18; # 180; no refills Rosuvastatin; Last refill 01/31/18; # 19; no refills  Last office visit 03/02/18  Labs up to date.   Refilled per protocol

## 2018-05-09 ENCOUNTER — Ambulatory Visit: Payer: BLUE CROSS/BLUE SHIELD | Admitting: Neurology

## 2018-05-09 DIAGNOSIS — G4733 Obstructive sleep apnea (adult) (pediatric): Secondary | ICD-10-CM

## 2018-05-09 DIAGNOSIS — G4730 Sleep apnea, unspecified: Secondary | ICD-10-CM

## 2018-05-09 DIAGNOSIS — R0683 Snoring: Secondary | ICD-10-CM

## 2018-05-09 DIAGNOSIS — E662 Morbid (severe) obesity with alveolar hypoventilation: Secondary | ICD-10-CM

## 2018-05-09 DIAGNOSIS — G4710 Hypersomnia, unspecified: Secondary | ICD-10-CM

## 2018-05-09 DIAGNOSIS — Z6841 Body Mass Index (BMI) 40.0 and over, adult: Principal | ICD-10-CM

## 2018-05-24 NOTE — Procedures (Signed)
[facility] [place], [place] [address] NAME:  John Mullins                                                               DOB: 8/7/ 1972 MEDICAL RECORD NUMBER [id number]                                                DOS:  05/10/2018 REFERRING PHYSICIAN: Windell Hummingbird, PA-C STUDY PERFORMED: Home Sleep Study by apnea link HISTORY: John Mullins is a 47 y.o. male patient, he had been seen in a sleep consultation on 01 September 2015, 2.5 years ago. The patient reports that his insurance at the time was with a high deductible plan and he could not afford $1500 out-of-pocket to go through with a sleep study that I had recommended.   The patient reports that a vacation with his family was cut short because nobody could share hotel bedroom with him. His snoring seems to have progressed to thunderous levels. In the meantime the patient has not been able to reduce his BMI, he carries a diagnosis of morbid obesity BMI over 45, has prediabetes, worsening snoring, and progression of hypersomnia. He has also been found to have elevated liver function tests, transaminases are monitored by primary care, (nail fungus medication was likely the cause), has esophagitis related to acid reflux , hypertension is controlled, has anxiety and a history of hiatal hernia.   Epworth Sleepiness score endorsed at 16/24 points.    BMI: 48.0  STUDY RESULTS:  Total Recording Time: 6 hours 23 minutes, valid test time 6h 11 min.  Total Apnea/Hypopnea Index (AHI):  26.8/h; RDI: 29.4 /h Average Oxygen Saturation:  91 %; Lowest Oxygen Saturation:  72%  Total Time Oxygen Saturation Below or at 88 %:  32.0 minutes  Average Heart Rate:  69 bpm (between 55 and 116 bpm) IMPRESSION: Moderate degree of obstructive sleep apnea (AHI 26.8) with severe degree of snoring (RDI), prolonged hypoxemia, and high cardiac rate variability.  RECOMMENDATION: The combination of hypoxemia and apnea is best treated  with CPAP, the patient's snoring can also be treated with this therapy.  I will order auto-CPAP 5-20 cm water with 3 cm EPR or flex function, mask of patient's choice and heated humidity.  Follow up 60-90 days into CPAP therapy, CPAP compliance is defined as 4 hours or more of nightly use.  I certify that I have reviewed the raw data recording prior to the issuance of this report in accordance with the standards of the American Academy of Sleep Medicine (AASM). Larey Seat, M.D.   05-24-2018    Medical Director of [facility] at [facility], accredited by the AASM. Diplomat of the ABPN and ABSM.

## 2018-05-24 NOTE — Addendum Note (Signed)
Addended by: Larey Seat on: 05/24/2018 05:04 PM   Modules accepted: Orders

## 2018-05-25 ENCOUNTER — Telehealth: Payer: Self-pay | Admitting: Neurology

## 2018-05-25 NOTE — Telephone Encounter (Signed)
I called pt. I advised pt that Dr. Brett Fairy reviewed their sleep study results and found that pt 's home sleep test. Dr. Brett Fairy recommends that pt starts auto CPAP. I reviewed PAP compliance expectations with the pt. Pt is agreeable to starting a CPAP. I advised pt that an order will be sent to a DME, [facility], and [facility] will call the pt within about one week after they file with the pt's insurance. [facility] will show the pt how to use the machine, fit for masks, and troubleshoot the CPAP if needed. A follow up appt was made for insurance purposes with Dr. Brett Fairy on Oct 28,2019 at 1:30 pm. Pt verbalized understanding to arrive 15 minutes early and bring their CPAP. A letter with all of this information in it will be mailed to the pt as a reminder. I verified with the pt that the address we have on file is correct. Pt verbalized understanding of results. Pt had no questions at this time but was encouraged to call back if questions arise.

## 2018-05-25 NOTE — Telephone Encounter (Signed)
-----   Message from Larey Seat, MD sent at 05/24/2018  5:04 PM EDT ----- IMPRESSION: Moderate degree of obstructive sleep apnea (AHI 26.8)  with severe degree of snoring (RDI), prolonged hypoxemia, and  high cardiac rate variability.  RECOMMENDATION: The combination of hypoxemia and apnea is best  treated with CPAP, the patient's snoring can also be treated with  this therapy.  I will order auto-CPAP 5-20 cm water with 3 cm EPR or flex  function, mask of patient's choice and heated humidity.  Follow up 60-90 days into CPAP therapy, Please explain to patient that  CPAP compliance is  defined as 4 hours or more of nightly use.

## 2018-06-08 ENCOUNTER — Other Ambulatory Visit: Payer: Self-pay | Admitting: Neurology

## 2018-06-08 ENCOUNTER — Encounter: Payer: Self-pay | Admitting: Neurology

## 2018-06-08 DIAGNOSIS — Z6841 Body Mass Index (BMI) 40.0 and over, adult: Secondary | ICD-10-CM

## 2018-06-08 DIAGNOSIS — G4730 Sleep apnea, unspecified: Principal | ICD-10-CM

## 2018-06-08 DIAGNOSIS — E662 Morbid (severe) obesity with alveolar hypoventilation: Secondary | ICD-10-CM

## 2018-06-08 DIAGNOSIS — G4710 Hypersomnia, unspecified: Secondary | ICD-10-CM

## 2018-06-08 NOTE — Telephone Encounter (Signed)
I have not heard when I can get my CPAP machine. I was told I would be contacted 2 weeks ago. Please advise. Maxcine Ham [phone]

## 2018-06-14 DIAGNOSIS — G4733 Obstructive sleep apnea (adult) (pediatric): Secondary | ICD-10-CM | POA: Diagnosis not present

## 2018-07-15 DIAGNOSIS — G4733 Obstructive sleep apnea (adult) (pediatric): Secondary | ICD-10-CM | POA: Diagnosis not present

## 2018-08-08 DIAGNOSIS — G4733 Obstructive sleep apnea (adult) (pediatric): Secondary | ICD-10-CM | POA: Diagnosis not present

## 2018-08-14 DIAGNOSIS — G4733 Obstructive sleep apnea (adult) (pediatric): Secondary | ICD-10-CM | POA: Diagnosis not present

## 2018-08-22 DIAGNOSIS — S90221A Contusion of right lesser toe(s) with damage to nail, initial encounter: Secondary | ICD-10-CM | POA: Diagnosis not present

## 2018-08-22 DIAGNOSIS — L03031 Cellulitis of right toe: Secondary | ICD-10-CM | POA: Diagnosis not present

## 2018-08-27 ENCOUNTER — Ambulatory Visit: Payer: Self-pay | Admitting: Neurology

## 2018-08-27 ENCOUNTER — Telehealth: Payer: Self-pay | Admitting: Neurology

## 2018-08-27 NOTE — Telephone Encounter (Signed)
Pt requesting a call to discuss r/s his CPAP f/u stating his insurance is not paying for the machine so he isnt sure why he needs to be seen within a certain timeframe. Please advise

## 2018-08-27 NOTE — Telephone Encounter (Signed)
NMCalled the patient and informed him that I spoke with [facility]. The patient was set up in august. He had a remainder on his deductible but it is being processed through his [place]. Informed the patient that we would need to make sure that we schedule him on by 90 days after starting the machine. Patient states that is fine he attempted last wk to cancel this apt for today because there was no way he could make it to the apt scheduled today. Advised the patient of a apt on 7:30 am opening with Ward Givens, NP. Pt accepted the apt and informed him to arrive between 7 and 7:15 am for check in. Pt verbalized understanding.

## 2018-08-28 ENCOUNTER — Encounter: Payer: Self-pay | Admitting: Neurology

## 2018-08-29 ENCOUNTER — Ambulatory Visit: Payer: BLUE CROSS/BLUE SHIELD | Admitting: Adult Health

## 2018-08-29 ENCOUNTER — Other Ambulatory Visit: Payer: Self-pay

## 2018-08-29 ENCOUNTER — Encounter: Payer: Self-pay | Admitting: Adult Health

## 2018-08-29 VITALS — BP 136/85 | HR 85 | Resp 18 | Ht 74.0 in | Wt 378.0 lb

## 2018-08-29 DIAGNOSIS — Z9989 Dependence on other enabling machines and devices: Secondary | ICD-10-CM

## 2018-08-29 DIAGNOSIS — G4733 Obstructive sleep apnea (adult) (pediatric): Secondary | ICD-10-CM

## 2018-08-29 NOTE — Patient Instructions (Signed)
Your Plan:  Continue using CPAP nightly and >4 hours each night If your symptoms worsen or you develop new symptoms please let us know.   Thank you for coming to see us at [facility]. I hope we have been able to provide you high quality care today.  You may receive a patient satisfaction survey over the next few weeks. We would appreciate your feedback and comments so that we may continue to improve ourselves and the health of our patients.  

## 2018-08-29 NOTE — Progress Notes (Signed)
PATIENT: John Mullins DOB: August 12, 1971  REASON FOR VISIT: follow up HISTORY FROM: patient  HISTORY OF PRESENT ILLNESS: Today 08/29/18:  John Mullins is a 47 year old male with a history of obstructive sleep apnea on CPAP.  He returns today for follow-up.  He was started on CPAP and returns today for his first download.  His download indicates that he use his machine 15 out of 30 days for compliance of 50%.  He uses machine greater than 4 hours 30 days for compliance of 43%.  He states that at the beginning of the month he requested supplies from [employer] and reports that it took approximately 2 weeks to get his new supplies.  On average he uses his machine 5 hours and 13 minutes.  His residual AHI is 4.6 on 5 to 20 cm of water with EPR of 3.  He does not have a significant leak.  He reports that he has noticed a benefit with the CPAP.  His wife states that he no longer snores.  She returns today for evaluation.  HISTORY John Mullins is a 47 y.o. male , seen here as a referral from NP Weber from [facility] for a sleep consultation.  I had the pleasure of seeing John Mullins today in a revisit, he had been seen in a sleep consultation on 01 September 2015, 2.5 years ago.   Today is 04 April 2018 the patient reports that his insurance at the time was with a high deductible plan and he could not afford $1500 out-of-pocket to go through with a sleep study that I recommended.  In the meantime his insurance has not changed, he still uses a [employer] through his employer. The patient reports that defecation he took with his family was cut short because nobody could share hotel bedroom with him.  His snoring seems to have progressed.    In the meantime the patient has not been able to reduce his BMI , weight was gained, he carries a diagnosis of morbid obesity over 45 , prediabetes, worsening snoring and progression of hypersomnia suspected to reflect sleep apnea. He  has also been found to have elevated liver function tests, transaminases are monitored by primary care, ( nail fungus medication was likely the cause) he has esophagitis related to acid reflux but has less symptoms on the current medication.  Still has  hypertension but controlled, anxiety reflux disorder, and a history of hiatal hernia.  His medications have changed slightly and are listed below.    His family history still includes diabetes , heart disease, and stroke in his father.     REVIEW OF SYSTEMS: Out of a complete 14 system review of symptoms, the patient complains only of the following symptoms, and all other reviewed systems are negative.  See HPI  ALLERGIES: No Known Allergies  HOME MEDICATIONS: Outpatient Medications Prior to Visit  Medication Sig Dispense Refill  . lisinopril-hydrochlorothiazide (PRINZIDE,ZESTORETIC) 10-12.5 MG tablet Take 1 tablet by mouth daily. Will need office visit for additional refills 90 tablet 3  . metFORMIN (GLUCOPHAGE-XR) 500 MG 24 hr tablet TAKE 2 TABLETS BY MOUTH EVERY DAY WITH BREAKFAST 180 tablet 0  . omeprazole (PRILOSEC) 40 MG capsule Take 1 capsule (40 mg total) by mouth daily. 90 capsule 3  . rosuvastatin (CRESTOR) 10 MG tablet TAKE 1 TABLET BY MOUTH EVERY DAY 90 tablet 0  . sertraline (ZOLOFT) 100 MG tablet Take 1 tablet (100 mg total) by mouth  daily. 90 tablet 3  . terbinafine (LAMISIL) 250 MG tablet Take 1 tablet (250 mg total) by mouth daily. 30 tablet 1   No facility-administered medications prior to visit.     PAST MEDICAL HISTORY: Past Medical History:  Diagnosis Date  . Anxiety   . GERD (gastroesophageal reflux disease)   . Hypertension     PAST SURGICAL HISTORY: No past surgical history on file.  FAMILY HISTORY: Family History  Problem Relation Age of Onset  . Heart disease Father   . Stroke Father   . Diabetes Father     SOCIAL HISTORY: Social History   Socioeconomic History  . Marital status: Married     Spouse name: Not on file  . Number of children: 1  . Years of education: Not on file  . Highest education level: Not on file  Occupational History  . Not on file  Social Needs  . Financial resource strain: Not on file  . Food insecurity:    Worry: Not on file    Inability: Not on file  . Transportation needs:    Medical: Not on file    Non-medical: Not on file  Tobacco Use  . Smoking status: Never Smoker  . Smokeless tobacco: Never Used  Substance and Sexual Activity  . Alcohol use: No  . Drug use: No  . Sexual activity: Yes  Lifestyle  . Physical activity:    Days per week: Not on file    Minutes per session: Not on file  . Stress: Not on file  Relationships  . Social connections:    Talks on phone: Not on file    Gets together: Not on file    Attends religious service: Not on file    Active member of club or organization: Not on file    Attends meetings of clubs or organizations: Not on file    Relationship status: Not on file  . Intimate partner violence:    Fear of current or ex partner: Not on file    Emotionally abused: Not on file    Physically abused: Not on file    Forced sexual activity: Not on file  Other Topics Concern  . Not on file  Social History Narrative  . Not on file      PHYSICAL EXAM  Vitals:   08/29/18 0731  BP: 136/85  Pulse: 85  Resp: 18  Weight: (!) 378 lb (171.5 kg)  Height: 6\' 2"  (1.88 m)   Body mass index is 48.53 kg/m.  Generalized: Well developed, in no acute distress   Neurological examination  Mentation: Alert oriented to time, place, history taking. Follows all commands speech and language fluent Cranial nerve II-XII: Pupils were equal round reactive to light. Extraocular movements were full, visual field were full on confrontational test. Facial sensation and strength were normal. Uvula tongue midline. Head turning and shoulder shrug  were normal and symmetric. Motor: The motor testing reveals 5 over 5 strength of all  4 extremities. Good symmetric motor tone is noted throughout.  Sensory: Sensory testing is intact to soft touch on all 4 extremities. No evidence of extinction is noted.  Coordination: Cerebellar testing reveals good finger-nose-finger and heel-to-shin bilaterally.  Gait and station: Gait is normal.  Reflexes: Deep tendon reflexes are symmetric and normal bilaterally.   DIAGNOSTIC DATA (LABS, IMAGING, TESTING) - I reviewed patient records, labs, notes, testing and imaging myself where available.  Lab Results  Component Value Date   WBC 13.4 (H) 06/10/2015  HGB 14.0 06/10/2015   HCT 41.5 06/10/2015   MCV 82.3 06/10/2015   PLT 358 06/10/2015      Component Value Date/Time   NA 141 01/26/2018 1745   K 3.8 01/26/2018 1745   CL 102 01/26/2018 1745   CO2 22 01/26/2018 1745   GLUCOSE 137 (H) 01/26/2018 1745   GLUCOSE 98 09/21/2016 1629   BUN 9 01/26/2018 1745   CREATININE 0.78 01/26/2018 1745   CREATININE 0.98 09/21/2016 1629   CALCIUM 9.1 01/26/2018 1745   PROT 6.7 03/02/2018 1128   ALBUMIN 4.2 03/02/2018 1128   AST 23 03/02/2018 1128   ALT 36 03/02/2018 1128   ALKPHOS 115 03/02/2018 1128   BILITOT 0.3 03/02/2018 1128   GFRNONAA 107 01/26/2018 1745   GFRAA 124 01/26/2018 1745   Lab Results  Component Value Date   CHOL 173 01/26/2018   HDL 28 (L) 01/26/2018   LDLCALC 97 01/26/2018   LDLDIRECT 117.5 09/25/2006   TRIG 238 (H) 01/26/2018   CHOLHDL 6.2 (H) 01/26/2018   Lab Results  Component Value Date   HGBA1C 6.6 (H) 01/26/2018   No results found for: [id number] Lab Results  Component Value Date   TSH 2.418 06/10/2015      ASSESSMENT AND PLAN 47 y.o. year old male  has a past medical history of Anxiety, GERD (gastroesophageal reflux disease), and Hypertension. here with :  1.  Obstructive sleep apnea on CPAP  The patient download shows suboptimal compliance.  However he did not use the machine for 2 weeks because he was waiting on supplies.  I have encouraged  the patient to continue using the CPAP nightly and greater than 4 hours each night.  He is advised that if his symptoms worsen or he develops new symptoms he should let us know.  He will follow-up in 6 months or sooner if needed.  I spent 15 minutes with the patient. 50% of this time was spent reviewing CPAP download   Ward Givens, MSN, NP-C 08/29/2018, 7:43 AM [facility] [address], [place], [place] [address] [phone]

## 2018-10-10 DIAGNOSIS — G4733 Obstructive sleep apnea (adult) (pediatric): Secondary | ICD-10-CM | POA: Diagnosis not present

## 2019-02-07 ENCOUNTER — Other Ambulatory Visit: Payer: Self-pay | Admitting: Emergency Medicine

## 2019-02-07 DIAGNOSIS — I10 Essential (primary) hypertension: Secondary | ICD-10-CM

## 2019-02-07 DIAGNOSIS — K219 Gastro-esophageal reflux disease without esophagitis: Secondary | ICD-10-CM

## 2019-02-07 NOTE — Telephone Encounter (Signed)
Requested medication (s) are due for refill today -over due  Requested medication (s) are on the active medication list - yes  Future visit scheduled -yes  Last refill: 08/16/17  Notes to clinic: Patient has been scheduled for appointment 02/13/19- request sent for review  Requested Prescriptions  Pending Prescriptions Disp Refills   sertraline (ZOLOFT) 100 MG tablet [Pharmacy Med Name: Sertraline HCl 100 MG Oral Tablet] 90 tablet 0    Sig: Take 1 tablet by mouth once daily     Psychiatry:  Antidepressants - SSRI Failed - 02/07/2019 11:17 AM      Failed - Valid encounter within last 6 months    Recent Outpatient Visits          11 months ago Onychomycosis   [facility] [place], PA-C   1 year ago Essential hypertension   [facility], Damaris Hippo, PA-C   1 year ago URI with cough and congestion   [facility], Damaris Hippo, PA-C   1 year ago Essential hypertension   [facility] [place], [place], MD   2 years ago Other hyperlipidemia   [facility], Kayleen Memos, MD            omeprazole (PRILOSEC) 40 MG capsule [Pharmacy Med Name: Omeprazole 40 MG Oral Capsule Delayed Release] 90 capsule 0    Sig: Take 1 capsule by mouth once daily     Gastroenterology: Proton Pump Inhibitors Passed - 02/07/2019 11:17 AM      Passed - Valid encounter within last 12 months    Recent Outpatient Visits          11 months ago Onychomycosis   [facility] [place], PA-C   1 year ago Essential hypertension   [facility], Damaris Hippo, PA-C   1 year ago URI with cough and congestion   [facility], Damaris Hippo, PA-C   1 year ago Essential hypertension   [facility] [place], [place], MD   2 years ago Other hyperlipidemia   [facility], Kayleen Memos, MD            lisinopril-hydrochlorothiazide (PRINZIDE,ZESTORETIC) 10-12.5 MG tablet [Pharmacy Med  Name: Lisinopril-hydroCHLOROthiazide 10-12.5 MG Oral Tablet] 90 tablet 0    Sig: Take 1 tablet by mouth once daily     Cardiovascular:  ACEI + Diuretic Combos Failed - 02/07/2019 11:17 AM      Failed - Na in normal range and within 180 days    Sodium  Date Value Ref Range Status  01/26/2018 141 134 - 144 mmol/L Final         Failed - K in normal range and within 180 days    Potassium  Date Value Ref Range Status  01/26/2018 3.8 3.5 - 5.2 mmol/L Final         Failed - Cr in normal range and within 180 days    Creat  Date Value Ref Range Status  09/21/2016 0.98 0.60 - 1.35 mg/dL Final   Creatinine, Ser  Date Value Ref Range Status  01/26/2018 0.78 0.76 - 1.27 mg/dL Final         Failed - Ca in normal range and within 180 days    Calcium  Date Value Ref Range Status  01/26/2018 9.1 8.7 - 10.2 mg/dL Final         Failed -  Valid encounter within last 6 months    Recent Outpatient Visits          11 months ago Onychomycosis   [facility], Damaris Hippo, PA-C   1 year ago Essential hypertension   [facility], Damaris Hippo, PA-C   1 year ago URI with cough and congestion   [facility] [place], PA-C   1 year ago Essential hypertension   [facility], Ines Bloomer, MD   2 years ago Other hyperlipidemia   [facility], Kayleen Memos, MD             Passed - Patient is not pregnant      Passed - Last BP in normal range    BP Readings from Last 1 Encounters:  08/29/18 136/85          Requested Prescriptions  Pending Prescriptions Disp Refills   sertraline (ZOLOFT) 100 MG tablet [Pharmacy Med Name: Sertraline HCl 100 MG Oral Tablet] 90 tablet 0    Sig: Take 1 tablet by mouth once daily     Psychiatry:  Antidepressants - SSRI Failed - 02/07/2019 11:17 AM      Failed - Valid encounter within last 6 months    Recent Outpatient Visits          11 months ago Onychomycosis   [facility] [place], PA-C   1 year ago Essential hypertension   [facility], Damaris Hippo, PA-C   1 year ago URI with cough and congestion   [facility], Damaris Hippo, PA-C   1 year ago Essential hypertension   [facility], [place], MD   2 years ago Other hyperlipidemia   [facility], Kayleen Memos, MD            omeprazole (PRILOSEC) 40 MG capsule [Pharmacy Med Name: Omeprazole 40 MG Oral Capsule Delayed Release] 90 capsule 0    Sig: Take 1 capsule by mouth once daily     Gastroenterology: Proton Pump Inhibitors Passed - 02/07/2019 11:17 AM      Passed - Valid encounter within last 12 months    Recent Outpatient Visits          11 months ago Onychomycosis   [facility], Damaris Hippo, PA-C   1 year ago Essential hypertension   [facility], Damaris Hippo, PA-C   1 year ago URI with cough and congestion   [facility], Damaris Hippo, PA-C   1 year ago Essential hypertension   [facility] [place], [place], MD   2 years ago Other hyperlipidemia   [facility], Kayleen Memos, MD            lisinopril-hydrochlorothiazide (PRINZIDE,ZESTORETIC) 10-12.5 MG tablet [Pharmacy Med Name: Lisinopril-hydroCHLOROthiazide 10-12.5 MG Oral Tablet] 90 tablet 0    Sig: Take 1 tablet by mouth once daily     Cardiovascular:  ACEI + Diuretic Combos Failed - 02/07/2019 11:17 AM      Failed - Na in normal range and within 180 days    Sodium  Date Value Ref Range Status  01/26/2018 141 134 - 144 mmol/L Final         Failed - K in normal range and within 180 days    Potassium  Date Value Ref  Range Status  01/26/2018 3.8 3.5 - 5.2 mmol/L Final         Failed - Cr in normal range and within 180 days    Creat  Date Value Ref Range Status  09/21/2016 0.98 0.60 - 1.35 mg/dL Final   Creatinine, Ser  Date Value Ref Range Status  01/26/2018 0.78 0.76 - 1.27 mg/dL Final          Failed - Ca in normal range and within 180 days    Calcium  Date Value Ref Range Status  01/26/2018 9.1 8.7 - 10.2 mg/dL Final         Failed - Valid encounter within last 6 months    Recent Outpatient Visits          11 months ago Onychomycosis   [facility] [place], PA-C   1 year ago Essential hypertension   [facility], Damaris Hippo, PA-C   1 year ago URI with cough and congestion   [facility], Damaris Hippo, PA-C   1 year ago Essential hypertension   [facility] [place], Ines Bloomer, MD   2 years ago Other hyperlipidemia   [facility], Kayleen Memos, MD             Passed - Patient is not pregnant      Passed - Last BP in normal range    BP Readings from Last 1 Encounters:  08/29/18 136/85

## 2019-02-13 ENCOUNTER — Other Ambulatory Visit: Payer: Self-pay

## 2019-02-13 ENCOUNTER — Telehealth (INDEPENDENT_AMBULATORY_CARE_PROVIDER_SITE_OTHER): Payer: BLUE CROSS/BLUE SHIELD | Admitting: Family Medicine

## 2019-02-13 DIAGNOSIS — Z114 Encounter for screening for human immunodeficiency virus [HIV]: Secondary | ICD-10-CM

## 2019-02-13 DIAGNOSIS — R7303 Prediabetes: Secondary | ICD-10-CM

## 2019-02-13 DIAGNOSIS — I10 Essential (primary) hypertension: Secondary | ICD-10-CM | POA: Diagnosis not present

## 2019-02-13 DIAGNOSIS — E7849 Other hyperlipidemia: Secondary | ICD-10-CM

## 2019-02-13 DIAGNOSIS — K219 Gastro-esophageal reflux disease without esophagitis: Secondary | ICD-10-CM

## 2019-02-13 MED ORDER — OMEPRAZOLE 40 MG PO CPDR: 40.0000 mg | DELAYED_RELEASE_CAPSULE | Freq: Every day | ORAL | 1 refills | Status: DC

## 2019-02-13 MED ORDER — ROSUVASTATIN CALCIUM 10 MG PO TABS: 10.0000 mg | ORAL_TABLET | Freq: Every day | ORAL | 1 refills | Status: DC

## 2019-02-13 MED ORDER — LISINOPRIL-HYDROCHLOROTHIAZIDE 10-12.5 MG PO TABS: 1.0000 | ORAL_TABLET | Freq: Every day | ORAL | 0 refills | Status: DC

## 2019-02-13 MED ORDER — SERTRALINE HCL 100 MG PO TABS: 100.0000 mg | ORAL_TABLET | Freq: Every day | ORAL | 1 refills | Status: DC

## 2019-02-13 MED ORDER — METFORMIN HCL ER 500 MG PO TB24: ORAL_TABLET | ORAL | 0 refills | Status: DC

## 2019-02-13 NOTE — Progress Notes (Signed)
Virtual Visit via telephone Note  I connected with patient on 02/13/19 at 942am by telephone and verified that I am speaking with the correct person using two identifiers. John Mullins is currently located at home and patient is currently with her during visit. The provider, Rutherford Guys, MD is located in their office at time of visit.  I discussed the limitations, risks, security and privacy concerns of performing an evaluation and management service by telephone and the availability of in person appointments. I also discussed with the patient that there may be a patient responsible charge related to this service. The patient expressed understanding and agreed to proceed.  CC: medication refills   HPI Previous PCP Windell Hummingbird, PA-C? 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 chest pain, SOB, leg edema, polydipsia, polyuria, nausea, vomiting, abd pain gerd well controlled with medication Anxiety well controlled with medication  BP Readings from Last 3 Encounters:  08/29/18 136/85  04/04/18 128/81  03/02/18 126/82   Wt Readings from Last 3 Encounters:  08/29/18 (!) 378 lb (171.5 kg)  04/04/18 (!) 374 lb (169.6 kg)  03/02/18 (!) 372 lb (168.7 kg)   Lab Results  Component Value Date   HGBA1C 6.6 (H) 01/26/2018   HGBA1C 6.1 09/21/2016   HGBA1C 6.2 (H) 06/10/2015   Lab Results  Component Value Date   MICROALBUR 0.5 09/25/2006   LDLCALC 97 01/26/2018   CREATININE 0.78 01/26/2018    Fall Risk  02/13/2019 03/02/2018 01/26/2018 12/05/2017 08/16/2017  Falls in the past year? 0 No No No No  Number falls in past yr: 0 - - - -  Injury with Fall? 0 - - - -     Depression screen [facility] 2/9 02/13/2019 03/02/2018 01/26/2018  Decreased Interest 0 0 0  Down, Depressed, Hopeless 0 0 0  PHQ - 2 Score  0 0 0    No Known Allergies  Prior to Admission medications   Medication Sig Start Date End Date Taking? Authorizing Provider  lisinopril-hydrochlorothiazide (PRINZIDE,ZESTORETIC) 10-12.5 MG tablet Take 1 tablet by mouth daily. Will need office visit for additional refills 08/16/17   Horald Pollen, MD  metFORMIN (GLUCOPHAGE-XR) 500 MG 24 hr tablet TAKE 2 TABLETS BY MOUTH EVERY DAY WITH BREAKFAST 05/08/18   Weber, Damaris Hippo, PA-C  omeprazole (PRILOSEC) 40 MG capsule Take 1 capsule (40 mg total) by mouth daily. 08/16/17   Horald Pollen, MD  rosuvastatin (CRESTOR) 10 MG tablet TAKE 1 TABLET BY MOUTH EVERY DAY 05/08/18   Gale Journey, Damaris Hippo, PA-C  sertraline (ZOLOFT) 100 MG tablet Take 1 tablet (100 mg total) by mouth daily. 08/16/17   Horald Pollen, MD    Past Medical History:  Diagnosis Date  . Anxiety   . GERD (gastroesophageal reflux disease)   . Hypertension     No past surgical history on file.  Social History   Tobacco Use  . Smoking status: Never Smoker  . Smokeless tobacco: Never Used  Substance Use Topics  . Alcohol use: No    Family History  Problem Relation Age of Onset  . Heart disease Father   . Stroke Father   . Diabetes Father     ROS Per hpi  Objective  Vitals as reported by the patient: none  There were no vitals filed for this visit.  ASSESSMENT and PLAN  1. Essential hypertension - TSH; Future - Lipid panel; Future - CMP14+EGFR; Future - lisinopril-hydrochlorothiazide (PRINZIDE,ZESTORETIC) 10-12.5 MG tablet; Take 1 tablet by mouth daily. - CBC; Future - TSH; Future  2. Prediabetes - Hemoglobin A1c; Future - metFORMIN (GLUCOPHAGE-XR) 500 MG 24 hr tablet; TAKE 2 TABLETS BY MOUTH EVERY DAY WITH BREAKFAST - Hemoglobin A1c; Future  3. Other hyperlipidemia - Lipid panel; Future - rosuvastatin (CRESTOR) 10 MG tablet; Take 1 tablet (10 mg total) by mouth daily. - Comprehensive metabolic panel; Future - Lipid panel; Future - TSH;  Future  4. Gastroesophageal reflux disease without esophagitis - omeprazole (PRILOSEC) 40 MG capsule; Take 1 capsule (40 mg total) by mouth daily.  5. Screening for HIV (human immunodeficiency virus)  Other orders - sertraline (ZOLOFT) 100 MG tablet; Take 1 tablet (100 mg total) by mouth daily.  Patient asymptomatic. Medications refilled. Labs ordered. Adjustments will be made as needed. Active at current job site.   FOLLOW-UP: 3 months.    The above assessment and management plan was discussed with the patient. The patient verbalized understanding of and has agreed to the management plan. Patient is aware to call the clinic if symptoms persist or worsen. Patient is aware when to return to the clinic for a follow-up visit. Patient educated on when it is appropriate to go to the emergency department.    I provided 12 minutes of non-face-to-face time during this encounter.  Rutherford Guys, MD [facility] [place], [place] [address] Ph.  [phone] Fax [phone]

## 2019-02-13 NOTE — Progress Notes (Signed)
No medical concerns today, only needs refills on all medications. No travel, anxiety or depression

## 2019-04-03 ENCOUNTER — Ambulatory Visit: Payer: BLUE CROSS/BLUE SHIELD | Admitting: Adult Health

## 2019-05-01 ENCOUNTER — Encounter: Payer: Self-pay | Admitting: Family Medicine

## 2019-05-01 ENCOUNTER — Encounter: Payer: Self-pay | Admitting: Neurology

## 2019-05-01 ENCOUNTER — Encounter: Payer: Self-pay | Admitting: Adult Health

## 2019-05-02 NOTE — Telephone Encounter (Signed)
Pt is requesting note be released to [employer]

## 2019-05-06 ENCOUNTER — Telehealth: Payer: Self-pay | Admitting: Adult Health

## 2019-05-06 ENCOUNTER — Telehealth: Payer: Self-pay | Admitting: General Practice

## 2019-05-06 NOTE — Telephone Encounter (Signed)
Pt has called for the status of the letter he requested.  Pt states it was needed for work today.  Please call

## 2019-05-06 NOTE — Telephone Encounter (Signed)
Patient called back again to check the status of this letter. Please contact patient back as soon as it is ready.

## 2019-05-06 NOTE — Telephone Encounter (Signed)
I sent a mychart message for pt concerning letter requested.

## 2019-05-06 NOTE — Telephone Encounter (Signed)
Patient is calling to schedule new patient appt with [facility] Please advise CB- [phone]

## 2019-05-07 NOTE — Telephone Encounter (Signed)
Ok to schedule NP appointment -- Can be in office or VV based on patient preference.

## 2019-05-07 NOTE — Telephone Encounter (Signed)
Pt is schedule for an appt with [facility].

## 2019-05-09 ENCOUNTER — Ambulatory Visit: Payer: Self-pay | Admitting: Family Medicine

## 2019-05-10 ENCOUNTER — Encounter: Payer: Self-pay | Admitting: Physician Assistant

## 2019-05-10 ENCOUNTER — Other Ambulatory Visit: Payer: Self-pay

## 2019-05-10 ENCOUNTER — Ambulatory Visit: Payer: BC Managed Care – PPO | Admitting: Physician Assistant

## 2019-05-10 VITALS — BP 124/84 | HR 82 | Temp 98.2°F | Resp 16 | Ht 73.5 in | Wt 382.0 lb

## 2019-05-10 DIAGNOSIS — R7303 Prediabetes: Secondary | ICD-10-CM | POA: Diagnosis not present

## 2019-05-10 DIAGNOSIS — I10 Essential (primary) hypertension: Secondary | ICD-10-CM | POA: Diagnosis not present

## 2019-05-10 DIAGNOSIS — K219 Gastro-esophageal reflux disease without esophagitis: Secondary | ICD-10-CM

## 2019-05-10 DIAGNOSIS — E785 Hyperlipidemia, unspecified: Secondary | ICD-10-CM

## 2019-05-10 LAB — COMPREHENSIVE METABOLIC PANEL
ALT: 34 U/L (ref 0–53)
AST: 19 U/L (ref 0–37)
Albumin: 4.3 g/dL (ref 3.5–5.2)
Alkaline Phosphatase: 123 U/L — ABNORMAL HIGH (ref 39–117)
BUN: 11 mg/dL (ref 6–23)
CO2: 26 mEq/L (ref 19–32)
Calcium: 9.3 mg/dL (ref 8.4–10.5)
Chloride: 104 mEq/L (ref 96–112)
Creatinine, Ser: 0.74 mg/dL (ref 0.40–1.50)
GFR: 112.45 mL/min (ref >60.00)
Glucose, Bld: 124 mg/dL — ABNORMAL HIGH (ref 70–99)
Potassium: 4.3 mEq/L (ref 3.5–5.1)
Sodium: 138 mEq/L (ref 135–145)
Total Bilirubin: 0.5 mg/dL (ref 0.2–1.2)
Total Protein: 7 g/dL (ref 6.0–8.3)

## 2019-05-10 LAB — LIPID PANEL
Cholesterol: 168 mg/dL (ref 0–200)
HDL: 37.9 mg/dL — ABNORMAL LOW (ref >39.00)
LDL Cholesterol: 107 mg/dL — ABNORMAL HIGH (ref 0–99)
NonHDL: 129.9
Total CHOL/HDL Ratio: 4
Triglycerides: 115 mg/dL (ref 0.0–149.0)
VLDL: 23 mg/dL (ref 0.0–40.0)

## 2019-05-10 LAB — MICROALBUMIN / CREATININE URINE RATIO
Creatinine,U: 206.5 mg/dL
Microalb Creat Ratio: 0.5 mg/g (ref 0.0–30.0)
Microalb, Ur: 1.1 mg/dL (ref 0.0–1.9)

## 2019-05-10 LAB — HEMOGLOBIN A1C: Hgb A1c MFr Bld: 7 % — ABNORMAL HIGH (ref 4.6–6.5)

## 2019-05-10 NOTE — Progress Notes (Signed)
Patient presents to clinic today to establish care.  Diet -- Endorses well balanced diet overall. Notes good portion sizes. . Notes biggest issue is soda consumption -- about 4-5 16 oz bottles per day. Some sports drinks and water.  Exercise -- Walking constantly at job. Averaging about 8,000 steps daily.   Acute Concerns: Patient endorses issue with some mild windedness when working outside in the heat for prolonged periods of time with mask. Notes having to wear mask even when alone outside. Is wondering if there is possibility of getting a work note so that he can take mask off when working alone as he notes it is already harder to breathe in heat while working and mask makes this worse. Body mass index is 49.72 kg/m. Denies history of asthma. Is not a smoker.   Chronic Issues: Hypertension -- Patient currently on a regimen lisinopril-HCTZ 10-12.5 mg daily. Endorses taking as direted. Prior stress test several years ago due to atypical chest pain later found out to be related to GERD and hiatal hernia. Negative stress test per patient.   BP Readings from Last 3 Encounters:  05/10/19 124/84  08/29/18 136/85  04/04/18 128/81   Hyperlipidemia -- Currently on daily statin which he endorses taking as directed. Diet as noted above.   Lab Results  Component Value Date   LDLCALC 97 01/26/2018    OSA -- Wears CPAP nightly. Notes good results when he uses.   Pre-diabetes -- Noted prior PCP diagnosed this. Is taking 1000 mg Metformin XR once daily. Does not check glucose at home. Denies numbness or tingling of hands or feet. Denies polyuria, polydipsia or polyphagia. Denies vision changes. No concerns today.  GERD -- Taking PPI daily with complete control of symptoms. + hx of hiatal hernia.  Generalized Anxiety -- On a regimen of Sertraline 100 mg daily. Has been on this regimen for some time. Doing well. Denies breakthrough symptoms. Denies SI/HI.Marland Kitchen    Past Medical History:  Diagnosis  Date  . Anxiety   . GERD (gastroesophageal reflux disease)   . Hypertension     History reviewed. No pertinent surgical history.  Current Outpatient Medications on File Prior to Visit  Medication Sig Dispense Refill  . lisinopril-hydrochlorothiazide (PRINZIDE,ZESTORETIC) 10-12.5 MG tablet Take 1 tablet by mouth daily. 90 tablet 0  . metFORMIN (GLUCOPHAGE-XR) 500 MG 24 hr tablet TAKE 2 TABLETS BY MOUTH EVERY DAY WITH BREAKFAST 180 tablet 0  . omeprazole (PRILOSEC) 40 MG capsule Take 1 capsule (40 mg total) by mouth daily. 90 capsule 1  . rosuvastatin (CRESTOR) 10 MG tablet Take 1 tablet (10 mg total) by mouth daily. 90 tablet 1  . sertraline (ZOLOFT) 100 MG tablet Take 1 tablet (100 mg total) by mouth daily. 90 tablet 1   No current facility-administered medications on file prior to visit.     No Known Allergies  Family History  Problem Relation Age of Onset  . Heart disease Father   . Stroke Father   . Diabetes Father     Social History   Socioeconomic History  . Marital status: Married    Spouse name: Not on file  . Number of children: 1  . Years of education: Not on file  . Highest education level: Not on file  Occupational History  . Not on file  Social Needs  . Financial resource strain: Not on file  . Food insecurity    Worry: Not on file    Inability: Not on file  .  Transportation needs    Medical: Not on file    Non-medical: Not on file  Tobacco Use  . Smoking status: Never Smoker  . Smokeless tobacco: Never Used  Substance and Sexual Activity  . Alcohol use: No  . Drug use: No  . Sexual activity: Yes  Lifestyle  . Physical activity    Days per week: Not on file    Minutes per session: Not on file  . Stress: Not on file  Relationships  . Social Herbalist on phone: Not on file    Gets together: Not on file    Attends religious service: Not on file    Active member of club or organization: Not on file    Attends meetings of clubs or  organizations: Not on file    Relationship status: Not on file  . Intimate partner violence    Fear of current or ex partner: Not on file    Emotionally abused: Not on file    Physically abused: Not on file    Forced sexual activity: Not on file  Other Topics Concern  . Not on file  Social History Narrative  . Not on file   Review of Systems  Constitutional: Negative for fever and weight loss.  HENT: Negative for ear discharge, ear pain, hearing loss and tinnitus.   Eyes: Negative for blurred vision, double vision, photophobia and pain.  Respiratory: Negative for cough and shortness of breath.   Cardiovascular: Negative for chest pain and palpitations.  Gastrointestinal: Negative for abdominal pain, blood in stool, constipation, diarrhea, heartburn, melena, nausea and vomiting.  Genitourinary: Negative for dysuria, flank pain, frequency, hematuria and urgency.  Musculoskeletal: Negative for falls.  Neurological: Negative for dizziness, loss of consciousness and headaches.  Endo/Heme/Allergies: Negative for environmental allergies.  Psychiatric/Behavioral: Negative for depression, hallucinations, substance abuse and suicidal ideas. The patient is not nervous/anxious and does not have insomnia.    BP 124/84   Pulse 82   Temp 98.2 F (36.8 C) (Skin)   Resp 16   Ht 6' 1.5" (1.867 m)   Wt (!) 382 lb (173.3 kg)   SpO2 97%   BMI 49.72 kg/m   Physical Exam Vitals signs reviewed.  Constitutional:      General: He is not in acute distress.    Appearance: He is well-developed. He is obese. He is not ill-appearing.  HENT:     Head: Normocephalic and atraumatic.     Mouth/Throat:     Mouth: Mucous membranes are moist.  Eyes:     Pupils: Pupils are equal, round, and reactive to light.  Neck:     Musculoskeletal: Neck supple.     Thyroid: No thyromegaly.  Cardiovascular:     Rate and Rhythm: Normal rate and regular rhythm.  Pulmonary:     Effort: Pulmonary effort is normal.      Breath sounds: Normal breath sounds.  Neurological:     General: No focal deficit present.     Mental Status: He is alert and oriented to person, place, and time.  Psychiatric:        Mood and Affect: Mood normal.    Assessment/Plan: 1. Essential hypertension BP normotensive. Asymptomatic. Continue current regimen. Continue working on diet and exercise.  2. Prediabetes Repeat labs today to reassess. Continue metformin XR as directed. Discussed need to keep up with diet and exercise to promote weight loss.  - Hemoglobin A1c - Comp Met (CMET) - Lipid Profile - Urine Microalbumin  w/creat. ratio  3. Gastroesophageal reflux disease without esophagitis Stable. Continue current regimen.  4. Morbid obesity due to excess calories ([place]) With some SOB secondary to deconditioning, abdominal fat, heat and exertion. Does note seem out of ordinary giving the scenarios. Letter written for work accommodations.  5. Hyperlipidemia, unspecified hyperlipidemia type Repeat lipids today.    Leeanne Rio, PA-C

## 2019-05-10 NOTE — Patient Instructions (Signed)
Please go to the lab today for blood work.  I will call you with your results. We will alter treatment regimen(s) if indicated by your results.   Please work on cutting soda consumption in half. Keep hydrated with water.   It was very nice meeting you today. Welcome to [facility]!

## 2019-05-13 ENCOUNTER — Ambulatory Visit: Payer: BLUE CROSS/BLUE SHIELD

## 2019-05-14 ENCOUNTER — Other Ambulatory Visit: Payer: Self-pay | Admitting: Emergency Medicine

## 2019-05-14 ENCOUNTER — Other Ambulatory Visit: Payer: BC Managed Care – PPO

## 2019-05-14 DIAGNOSIS — R7303 Prediabetes: Secondary | ICD-10-CM

## 2019-05-14 DIAGNOSIS — R748 Abnormal levels of other serum enzymes: Secondary | ICD-10-CM

## 2019-05-14 MED ORDER — METFORMIN HCL ER 500 MG PO TB24: 500.0000 mg | ORAL_TABLET | Freq: Three times a day (TID) | ORAL | 1 refills | Status: DC

## 2019-05-15 ENCOUNTER — Other Ambulatory Visit: Payer: Self-pay

## 2019-05-15 DIAGNOSIS — R748 Abnormal levels of other serum enzymes: Secondary | ICD-10-CM

## 2019-05-15 DIAGNOSIS — E559 Vitamin D deficiency, unspecified: Secondary | ICD-10-CM

## 2019-05-15 LAB — GAMMA GT: GGT: 48 U/L (ref 3–95)

## 2019-05-15 LAB — VITAMIN D 25 HYDROXY (VIT D DEFICIENCY, FRACTURES): Vit D, 25-Hydroxy: 25 ng/mL — ABNORMAL LOW (ref 30–100)

## 2019-05-15 MED ORDER — VITAMIN D (ERGOCALCIFEROL) 1.25 MG (50000 UNIT) PO CAPS: 50000.0000 [IU] | ORAL_CAPSULE | ORAL | 0 refills | Status: DC

## 2019-05-16 ENCOUNTER — Ambulatory Visit: Payer: BLUE CROSS/BLUE SHIELD | Admitting: Family Medicine

## 2019-07-03 ENCOUNTER — Ambulatory Visit: Payer: BC Managed Care – PPO

## 2019-07-04 ENCOUNTER — Encounter: Payer: Self-pay | Admitting: Physician Assistant

## 2019-07-05 ENCOUNTER — Other Ambulatory Visit: Payer: Self-pay

## 2019-07-05 ENCOUNTER — Ambulatory Visit (INDEPENDENT_AMBULATORY_CARE_PROVIDER_SITE_OTHER): Payer: BC Managed Care – PPO

## 2019-07-05 DIAGNOSIS — E559 Vitamin D deficiency, unspecified: Secondary | ICD-10-CM

## 2019-07-05 DIAGNOSIS — Z23 Encounter for immunization: Secondary | ICD-10-CM

## 2019-07-05 DIAGNOSIS — R748 Abnormal levels of other serum enzymes: Secondary | ICD-10-CM | POA: Diagnosis not present

## 2019-07-05 LAB — HEPATIC FUNCTION PANEL
ALT: 39 U/L (ref 0–53)
AST: 25 U/L (ref 0–37)
Albumin: 4.3 g/dL (ref 3.5–5.2)
Alkaline Phosphatase: 106 U/L (ref 39–117)
Bilirubin, Direct: 0.1 mg/dL (ref 0.0–0.3)
Total Bilirubin: 0.6 mg/dL (ref 0.2–1.2)
Total Protein: 7.3 g/dL (ref 6.0–8.3)

## 2019-07-09 LAB — VITAMIN D 25 HYDROXY (VIT D DEFICIENCY, FRACTURES): VITD: 29 ng/mL — ABNORMAL LOW (ref 30.00–100.00)

## 2019-07-10 DIAGNOSIS — M67911 Unspecified disorder of synovium and tendon, right shoulder: Secondary | ICD-10-CM | POA: Diagnosis not present

## 2019-07-31 DIAGNOSIS — M67911 Unspecified disorder of synovium and tendon, right shoulder: Secondary | ICD-10-CM | POA: Diagnosis not present

## 2019-08-02 ENCOUNTER — Other Ambulatory Visit: Payer: Self-pay | Admitting: Orthopedic Surgery

## 2019-08-02 DIAGNOSIS — G8929 Other chronic pain: Secondary | ICD-10-CM

## 2019-08-25 ENCOUNTER — Ambulatory Visit
Admission: RE | Admit: 2019-08-25 | Discharge: 2019-08-25 | Disposition: A | Discharge status: Home / Self Care | Payer: BC Managed Care – PPO | Source: Ambulatory Visit | Attending: Orthopedic Surgery | Admitting: Orthopedic Surgery

## 2019-08-25 ENCOUNTER — Other Ambulatory Visit: Payer: Self-pay

## 2019-08-25 DIAGNOSIS — M25511 Pain in right shoulder: Secondary | ICD-10-CM | POA: Diagnosis not present

## 2019-08-25 DIAGNOSIS — G8929 Other chronic pain: Secondary | ICD-10-CM

## 2019-09-06 DIAGNOSIS — S46011A Strain of muscle(s) and tendon(s) of the rotator cuff of right shoulder, initial encounter: Secondary | ICD-10-CM | POA: Diagnosis not present

## 2019-09-06 DIAGNOSIS — M75121 Complete rotator cuff tear or rupture of right shoulder, not specified as traumatic: Secondary | ICD-10-CM | POA: Diagnosis not present

## 2019-09-10 ENCOUNTER — Other Ambulatory Visit: Payer: Self-pay | Admitting: Orthopedic Surgery

## 2019-09-16 ENCOUNTER — Other Ambulatory Visit: Payer: Self-pay | Admitting: Orthopedic Surgery

## 2019-09-30 ENCOUNTER — Other Ambulatory Visit (HOSPITAL_COMMUNITY)
Admission: RE | Admit: 2019-09-30 | Discharge: 2019-09-30 | Disposition: A | Discharge status: Home / Self Care | Payer: BC Managed Care – PPO | Source: Ambulatory Visit | Attending: Orthopedic Surgery | Admitting: Orthopedic Surgery

## 2019-09-30 DIAGNOSIS — Z01812 Encounter for preprocedural laboratory examination: Secondary | ICD-10-CM | POA: Insufficient documentation

## 2019-09-30 DIAGNOSIS — Z20828 Contact with and (suspected) exposure to other viral communicable diseases: Secondary | ICD-10-CM | POA: Insufficient documentation

## 2019-09-30 NOTE — Patient Instructions (Addendum)
DUE TO COVID-19 ONLY ONE VISITOR IS ALLOWED TO COME WITH YOU AND STAY IN THE WAITING ROOM ONLY DURING PRE OP AND PROCEDURE DAY OF SURGERY. THE 1 VISITOR MAY VISIT WITH YOU AFTER SURGERY IN YOUR PRIVATE ROOM DURING VISITING HOURS ONLY!  YOU NEED TO HAVE A COVID 19 TEST ON___11/30____ @_______ , THIS TEST MUST BE DONE BEFORE SURGERY, COME  [place] , [id number].  ([place]) ONCE YOUR COVID TEST IS COMPLETED, PLEASE BEGIN THE QUARANTINE INSTRUCTIONS AS OUTLINED IN YOUR HANDOUT.                John Mullins   Your procedure is scheduled on: 10/03/19   Report to [facility] Main  Entrance   Report to admitting at  8:00 AM     Call this number if you have problems the morning of surgery [place], NO CHEWING GUM [place].   Do not eat food After Midnight.   YOU MAY HAVE CLEAR LIQUIDS FROM MIDNIGHT UNTIL 7:00 AM.   At 7:00 AM Please finish the prescribed Pre-Surgery Gatorade drink.   Nothing by mouth after you finish the Gatorade drink !    Take these medicines the morning of surgery with A SIP OF WATER: Zoloft, Prilosec  Bring mask and tubing to the hospital  DO NOT [place] may not have any metal on your body including piercings              Do not wear jewelry,  lotions, powders or deodorant                      Men may shave face and neck.   Do not bring valuables to the hospital. [place].  Contacts, dentures or bridgework may not be worn into surgery.      Patients discharged the day of surgery will not be allowed to drive home.   IF YOU ARE HAVING SURGERY AND GOING HOME THE SAME DAY, YOU MUST HAVE AN ADULT TO DRIVE YOU HOME AND BE WITH YOU FOR 24 HOURS.   YOU MAY GO HOME BY TAXI OR UBER OR ORTHERWISE, BUT AN ADULT MUST ACCOMPANY  YOU HOME AND STAY WITH YOU FOR 24 HOURS.  Name and phone number of your driver:  Special Instructions: N/A              Please read over the following fact sheets you were given: _____________________________________________________________________             [facility] - Preparing for Surgery  Before surgery, you can play an important role.   Because skin is not sterile, your skin needs to be as free of germs as possible.   You can reduce the number of germs on your skin by washing with CHG (chlorahexidine gluconate) soap before surgery.   CHG is an antiseptic cleaner which kills germs and bonds with the skin to continue killing germs even after washing. Please DO NOT use if you have an allergy to CHG or antibacterial soaps.   If  your skin becomes reddened/irritated stop using the CHG and inform your nurse when you arrive at Short Stay.   You may shave your face/neck.  Please follow these instructions carefully:  1.  Shower with CHG Soap the night before surgery and the  morning of Surgery.  2.  If you choose to wash your hair, wash your hair first as usual with your  normal  shampoo.  3.  After you shampoo, rinse your hair and body thoroughly to remove the  shampoo.                                        4.  Use CHG as you would any other liquid soap.  You can apply chg directly  to the skin and wash                       Gently with a scrungie or clean washcloth.  5.  Apply the CHG Soap to your body ONLY FROM THE NECK DOWN.   Do not use on face/ open                           Wound or open sores. Avoid contact with eyes, ears mouth and genitals (private parts).                       Wash face,  Genitals (private parts) with your normal soap.             6.  Wash thoroughly, paying special attention to the area where your surgery  will be performed.  7.  Thoroughly rinse your body with warm water from the neck down.  8.  DO NOT shower/wash with your normal soap after using and  rinsing off  the CHG Soap.             9.  Pat yourself dry with a clean towel.            10.  Wear clean pajamas.            11.  Place clean sheets on your bed the night of your first shower and do not  sleep with pets. Day of Surgery : Do not apply any lotions/deodorants the morning of surgery.  Please wear clean clothes to the hospital/surgery center.  FAILURE TO FOLLOW THESE INSTRUCTIONS MAY RESULT IN THE CANCELLATION OF YOUR SURGERY PATIENT SIGNATURE_________________________________  NURSE SIGNATURE__________________________________  ________________________________________________________________________   John Mullins  An incentive spirometer is a tool that can help keep your lungs clear and active. This tool measures how well you are filling your lungs with each breath. Taking long deep breaths may help reverse or decrease the chance of developing breathing (pulmonary) problems (especially infection) following:  A long period of time when you are unable to move or be active. BEFORE THE PROCEDURE   If the spirometer includes an indicator to show your best effort, your nurse or respiratory therapist will set it to a desired goal.  If possible, sit up straight or lean slightly forward. Try not to slouch.  Hold the incentive spirometer in an upright position. INSTRUCTIONS FOR USE  1. Sit on the edge of your bed if possible, or sit up as far as you can in bed or on a chair. 2. Hold the incentive spirometer in  an upright position. 3. Breathe out normally. 4. Place the mouthpiece in your mouth and seal your lips tightly around it. 5. Breathe in slowly and as deeply as possible, raising the piston or the ball toward the top of the column. 6. Hold your breath for 3-5 seconds or for as long as possible. Allow the piston or ball to fall to the bottom of the column. 7. Remove the mouthpiece from your mouth and breathe out normally. 8. Rest for a few seconds and repeat Steps 1  through 7 at least 10 times every 1-2 hours when you are awake. Take your time and take a few normal breaths between deep breaths. 9. The spirometer may include an indicator to show your best effort. Use the indicator as a goal to work toward during each repetition. 10. After each set of 10 deep breaths, practice coughing to be sure your lungs are clear. If you have an incision (the cut made at the time of surgery), support your incision when coughing by placing a pillow or rolled up towels firmly against it. Once you are able to get out of bed, walk around indoors and cough well. You may stop using the incentive spirometer when instructed by your caregiver.  RISKS AND COMPLICATIONS  Take your time so you do not get dizzy or light-headed.  If you are in pain, you may need to take or ask for pain medication before doing incentive spirometry. It is harder to take a deep breath if you are having pain. AFTER USE  Rest and breathe slowly and easily.  It can be helpful to keep track of a log of your progress. Your caregiver can provide you with a simple table to help with this. If you are using the spirometer at home, follow these instructions: [place] IF:   You are having difficultly using the spirometer.  You have trouble using the spirometer as often as instructed.  Your pain medication is not giving enough relief while using the spirometer.  You develop fever of 100.5 F (38.1 C) or higher. SEEK IMMEDIATE MEDICAL CARE IF:   You cough up bloody sputum that had not been present before.  You develop fever of 102 F (38.9 C) or greater.  You develop worsening pain at or near the incision site. MAKE SURE YOU:   Understand these instructions.  Will watch your condition.  Will get help right away if you are not doing well or get worse. Document Released: 02/27/2007 Document Revised: 01/09/2012 Document Reviewed: 04/30/2007 [facility] Patient Information 2014 [place],  [place].   ________________________________________________________________________

## 2019-10-01 ENCOUNTER — Encounter (HOSPITAL_COMMUNITY): Payer: Self-pay

## 2019-10-01 ENCOUNTER — Other Ambulatory Visit: Payer: Self-pay

## 2019-10-01 ENCOUNTER — Encounter (HOSPITAL_COMMUNITY)
Admission: RE | Admit: 2019-10-01 | Discharge: 2019-10-01 | Disposition: A | Discharge status: Home / Self Care | Payer: BC Managed Care – PPO | Source: Ambulatory Visit | Attending: Orthopedic Surgery | Admitting: Orthopedic Surgery

## 2019-10-01 DIAGNOSIS — E119 Type 2 diabetes mellitus without complications: Secondary | ICD-10-CM | POA: Diagnosis not present

## 2019-10-01 DIAGNOSIS — Z01818 Encounter for other preprocedural examination: Secondary | ICD-10-CM | POA: Diagnosis not present

## 2019-10-01 DIAGNOSIS — M75101 Unspecified rotator cuff tear or rupture of right shoulder, not specified as traumatic: Secondary | ICD-10-CM | POA: Diagnosis not present

## 2019-10-01 DIAGNOSIS — R9431 Abnormal electrocardiogram [ECG] [EKG]: Secondary | ICD-10-CM | POA: Insufficient documentation

## 2019-10-01 DIAGNOSIS — I444 Left anterior fascicular block: Secondary | ICD-10-CM | POA: Diagnosis not present

## 2019-10-01 HISTORY — DX: Type 2 diabetes mellitus without complications: E11.9

## 2019-10-01 HISTORY — DX: Sleep apnea, unspecified: G47.30

## 2019-10-01 LAB — BASIC METABOLIC PANEL
Anion gap: 7 (ref 5–15)
BUN: 15 mg/dL (ref 6–20)
CO2: 25 mmol/L (ref 22–32)
Calcium: 9.7 mg/dL (ref 8.9–10.3)
Chloride: 106 mmol/L (ref 98–111)
Creatinine, Ser: 0.77 mg/dL (ref 0.61–1.24)
GFR calc Af Amer: >60 mL/min (ref >60)
GFR calc non Af Amer: >60 mL/min (ref >60)
Glucose, Bld: 122 mg/dL — ABNORMAL HIGH (ref 70–99)
Potassium: 4.2 mmol/L (ref 3.5–5.1)
Sodium: 138 mmol/L (ref 135–145)

## 2019-10-01 LAB — NOVEL CORONAVIRUS, NAA (HOSP ORDER, SEND-OUT TO REF LAB; TAT 18-24 HRS): SARS-CoV-2, NAA: NOT DETECTED

## 2019-10-01 LAB — HEMOGLOBIN A1C
Hgb A1c MFr Bld: 6.8 % — ABNORMAL HIGH (ref 4.8–5.6)
Mean Plasma Glucose: 148.46 mg/dL

## 2019-10-01 LAB — CBC
HCT: 44 % (ref 39.0–52.0)
Hemoglobin: 14 g/dL (ref 13.0–17.0)
MCH: 27.1 pg (ref 26.0–34.0)
MCHC: 31.8 g/dL (ref 30.0–36.0)
MCV: 85.1 fL (ref 80.0–100.0)
Platelets: 369 10*3/uL (ref 150–400)
RBC: 5.17 MIL/uL (ref 4.22–5.81)
RDW: 14.8 % (ref 11.5–15.5)
WBC: 11.6 10*3/uL — ABNORMAL HIGH (ref 4.0–10.5)
nRBC: 0 % (ref 0.0–0.2)

## 2019-10-01 LAB — GLUCOSE, CAPILLARY: Glucose-Capillary: 111 mg/dL — ABNORMAL HIGH (ref 70–99)

## 2019-10-01 NOTE — Progress Notes (Signed)
PCP - Dr. Teresita Madura Cardiologist - no  Chest x-ray - no EKG - 10/01/19 Stress Test - no ECHO -no  Cardiac Cath -no   Sleep Study - yes CPAP - yes  Fasting Blood Sugar - Doesn't test. Reports that he is pre diabetic Checks Blood Sugar ___0__ times a day  Blood Thinner Instructions:NA Aspirin Instructions: Last Dose:  Anesthesia review:   Patient denies shortness of breath, fever, cough and chest pain at PAT appointment yes  Patient verbalized understanding of instructions that were given to them at the PAT appointment. Patient was also instructed that they will need to review over the PAT instructions again at home before surgery. yes

## 2019-10-02 MED ORDER — DEXTROSE 5 % IV SOLN
3.0000 g | INTRAVENOUS | Status: AC
  Administered 2019-10-03: 11:00:00 3 g via INTRAVENOUS
  Filled 2019-10-02: qty 3

## 2019-10-03 ENCOUNTER — Ambulatory Visit (HOSPITAL_COMMUNITY)
Admission: RE | Admit: 2019-10-03 | Discharge: 2019-10-03 | Disposition: A | Discharge status: Home / Self Care | Payer: BC Managed Care – PPO | Attending: Orthopedic Surgery | Admitting: Orthopedic Surgery

## 2019-10-03 ENCOUNTER — Encounter (HOSPITAL_COMMUNITY)
Admission: RE | Disposition: A | Discharge status: Home / Self Care | Payer: Self-pay | Source: Home / Self Care | Attending: Orthopedic Surgery

## 2019-10-03 ENCOUNTER — Ambulatory Visit (HOSPITAL_COMMUNITY): Payer: BC Managed Care – PPO | Admitting: Certified Registered Nurse Anesthetist

## 2019-10-03 ENCOUNTER — Other Ambulatory Visit: Payer: Self-pay

## 2019-10-03 ENCOUNTER — Ambulatory Visit (HOSPITAL_COMMUNITY): Payer: BC Managed Care – PPO | Admitting: Physician Assistant

## 2019-10-03 ENCOUNTER — Encounter (HOSPITAL_COMMUNITY): Payer: Self-pay | Admitting: *Deleted

## 2019-10-03 DIAGNOSIS — F419 Anxiety disorder, unspecified: Secondary | ICD-10-CM | POA: Diagnosis not present

## 2019-10-03 DIAGNOSIS — M75121 Complete rotator cuff tear or rupture of right shoulder, not specified as traumatic: Secondary | ICD-10-CM | POA: Insufficient documentation

## 2019-10-03 DIAGNOSIS — M7541 Impingement syndrome of right shoulder: Secondary | ICD-10-CM | POA: Diagnosis not present

## 2019-10-03 DIAGNOSIS — E119 Type 2 diabetes mellitus without complications: Secondary | ICD-10-CM | POA: Insufficient documentation

## 2019-10-03 DIAGNOSIS — G4730 Sleep apnea, unspecified: Secondary | ICD-10-CM | POA: Diagnosis not present

## 2019-10-03 DIAGNOSIS — Z79899 Other long term (current) drug therapy: Secondary | ICD-10-CM | POA: Insufficient documentation

## 2019-10-03 DIAGNOSIS — G8918 Other acute postprocedural pain: Secondary | ICD-10-CM | POA: Diagnosis not present

## 2019-10-03 DIAGNOSIS — Z7984 Long term (current) use of oral hypoglycemic drugs: Secondary | ICD-10-CM | POA: Diagnosis not present

## 2019-10-03 DIAGNOSIS — I10 Essential (primary) hypertension: Secondary | ICD-10-CM | POA: Insufficient documentation

## 2019-10-03 DIAGNOSIS — Z6841 Body Mass Index (BMI) 40.0 and over, adult: Secondary | ICD-10-CM | POA: Diagnosis not present

## 2019-10-03 DIAGNOSIS — K219 Gastro-esophageal reflux disease without esophagitis: Secondary | ICD-10-CM | POA: Insufficient documentation

## 2019-10-03 DIAGNOSIS — M75101 Unspecified rotator cuff tear or rupture of right shoulder, not specified as traumatic: Secondary | ICD-10-CM | POA: Diagnosis not present

## 2019-10-03 HISTORY — PX: ARTHOSCOPIC ROTAOR CUFF REPAIR: SHX5002

## 2019-10-03 HISTORY — PX: SHOULDER ARTHROSCOPY WITH SUBACROMIAL DECOMPRESSION: SHX5684

## 2019-10-03 LAB — GLUCOSE, CAPILLARY
Glucose-Capillary: 150 mg/dL — ABNORMAL HIGH (ref 70–99)
Glucose-Capillary: 119 mg/dL — ABNORMAL HIGH (ref 70–99)

## 2019-10-03 SURGERY — REPAIR, ROTATOR CUFF, ARTHROSCOPIC
Anesthesia: General | Site: Shoulder | Laterality: Right

## 2019-10-03 MED ORDER — LIDOCAINE 2% (20 MG/ML) 5 ML SYRINGE
INTRAMUSCULAR | Status: DC | PRN
  Administered 2019-10-03: 80 mg via INTRAVENOUS
  Administered 2019-10-03: 20 mg via INTRAVENOUS

## 2019-10-03 MED ORDER — OXYCODONE-ACETAMINOPHEN 5-325 MG PO TABS: ORAL_TABLET | ORAL | 0 refills | Status: DC

## 2019-10-03 MED ORDER — BUPIVACAINE HCL (PF) 0.5 % IJ SOLN
INTRAMUSCULAR | Status: DC | PRN
  Administered 2019-10-03: 10 mL

## 2019-10-03 MED ORDER — PHENYLEPHRINE HCL-NACL 10-0.9 MG/250ML-% IV SOLN
INTRAVENOUS | Status: DC | PRN
  Administered 2019-10-03: 20 ug/min via INTRAVENOUS

## 2019-10-03 MED ORDER — PROPOFOL 10 MG/ML IV BOLUS
INTRAVENOUS | Status: DC | PRN
  Administered 2019-10-03: 50 mg via INTRAVENOUS
  Administered 2019-10-03: 250 mg via INTRAVENOUS

## 2019-10-03 MED ORDER — MEPERIDINE HCL 50 MG/ML IJ SOLN: 6.2500 mg | INTRAMUSCULAR | Status: DC | PRN

## 2019-10-03 MED ORDER — LACTATED RINGERS IV SOLN
INTRAVENOUS | Status: DC
  Administered 2019-10-03: 08:00:00 via INTRAVENOUS

## 2019-10-03 MED ORDER — ONDANSETRON HCL 4 MG/2ML IJ SOLN
INTRAMUSCULAR | Status: DC | PRN
  Administered 2019-10-03: 4 mg via INTRAVENOUS

## 2019-10-03 MED ORDER — ROCURONIUM BROMIDE 100 MG/10ML IV SOLN
INTRAVENOUS | Status: DC | PRN
  Administered 2019-10-03: 20 mg via INTRAVENOUS
  Administered 2019-10-03: 60 mg via INTRAVENOUS

## 2019-10-03 MED ORDER — PROMETHAZINE HCL 25 MG/ML IJ SOLN: 6.2500 mg | INTRAMUSCULAR | Status: DC | PRN

## 2019-10-03 MED ORDER — FENTANYL CITRATE (PF) 100 MCG/2ML IJ SOLN
50.0000 ug | INTRAMUSCULAR | Status: DC
  Administered 2019-10-03: 50 ug via INTRAVENOUS
  Filled 2019-10-03: qty 2

## 2019-10-03 MED ORDER — DEXAMETHASONE SODIUM PHOSPHATE 10 MG/ML IJ SOLN
INTRAMUSCULAR | Status: DC | PRN
  Administered 2019-10-03: 5 mg via INTRAVENOUS

## 2019-10-03 MED ORDER — PROPOFOL 500 MG/50ML IV EMUL
INTRAVENOUS | Status: AC
  Filled 2019-10-03: qty 200

## 2019-10-03 MED ORDER — TIZANIDINE HCL 4 MG PO TABS: 4.0000 mg | ORAL_TABLET | Freq: Three times a day (TID) | ORAL | 1 refills | Status: DC | PRN

## 2019-10-03 MED ORDER — BUPIVACAINE LIPOSOME 1.3 % IJ SUSP
INTRAMUSCULAR | Status: DC | PRN
  Administered 2019-10-03: 10 mL via PERINEURAL

## 2019-10-03 MED ORDER — PROPOFOL 10 MG/ML IV BOLUS
INTRAVENOUS | Status: AC
  Filled 2019-10-03: qty 20

## 2019-10-03 MED ORDER — PHENYLEPHRINE 40 MCG/ML (10ML) SYRINGE FOR IV PUSH (FOR BLOOD PRESSURE SUPPORT)
PREFILLED_SYRINGE | INTRAVENOUS | Status: DC | PRN
  Administered 2019-10-03 (×2): 80 ug via INTRAVENOUS

## 2019-10-03 MED ORDER — SODIUM CHLORIDE 0.9 % IR SOLN
Status: DC | PRN
  Administered 2019-10-03: 6000 mL

## 2019-10-03 MED ORDER — CHLORHEXIDINE GLUCONATE 4 % EX LIQD: 60.0000 mL | Freq: Once | CUTANEOUS | Status: DC

## 2019-10-03 MED ORDER — HYDROMORPHONE HCL 1 MG/ML IJ SOLN: 0.2500 mg | INTRAMUSCULAR | Status: DC | PRN

## 2019-10-03 MED ORDER — MIDAZOLAM HCL 2 MG/2ML IJ SOLN
1.0000 mg | INTRAMUSCULAR | Status: DC
  Administered 2019-10-03: 2 mg via INTRAVENOUS
  Administered 2019-10-03 (×2): 1 mg via INTRAVENOUS
  Filled 2019-10-03 (×2): qty 2

## 2019-10-03 MED ORDER — DEXAMETHASONE SODIUM PHOSPHATE 10 MG/ML IJ SOLN
INTRAMUSCULAR | Status: AC
  Filled 2019-10-03: qty 1

## 2019-10-03 MED ORDER — SUGAMMADEX SODIUM 500 MG/5ML IV SOLN
INTRAVENOUS | Status: DC | PRN
  Administered 2019-10-03: 500 mg via INTRAVENOUS

## 2019-10-03 MED ORDER — ONDANSETRON HCL 4 MG/2ML IJ SOLN
INTRAMUSCULAR | Status: AC
  Filled 2019-10-03: qty 2

## 2019-10-03 MED ORDER — ROCURONIUM BROMIDE 10 MG/ML (PF) SYRINGE
PREFILLED_SYRINGE | INTRAVENOUS | Status: AC
  Filled 2019-10-03: qty 10

## 2019-10-03 SURGICAL SUPPLY — 69 items
AID PSTN UNV HD RSTRNT DISP (MISCELLANEOUS)
ANCH SUT SWLK 19.1X4.75 VT (Anchor) ×2 IMPLANT
ANCHOR PEEK 4.75X19.1 SWLK C (Anchor) ×2 IMPLANT
BOOTIES KNEE HIGH SLOAN (MISCELLANEOUS) ×4 IMPLANT
BURR OVAL 8 FLU 4.0X13 (MISCELLANEOUS) ×2 IMPLANT
CANNULA 5.75X7 CRYSTAL CLEAR (CANNULA) ×2 IMPLANT
CANNULA 5.75X71 LONG (CANNULA) IMPLANT
CANNULA TWIST IN 8.25X7CM (CANNULA) IMPLANT
COOLER ICEMAN CLASSIC (MISCELLANEOUS) IMPLANT
COVER SURGICAL LIGHT HANDLE (MISCELLANEOUS) ×2 IMPLANT
COVER WAND RF STERILE (DRAPES) IMPLANT
CUTTER BONE 4.0MM X 13CM (MISCELLANEOUS) ×2 IMPLANT
DISSECTOR  3.8MM X 13CM (MISCELLANEOUS)
DISSECTOR 3.8MM X 13CM (MISCELLANEOUS) IMPLANT
DRAPE IMP U-DRAPE 54X76 (DRAPES) ×2 IMPLANT
DRAPE INCISE IOBAN 66X45 STRL (DRAPES) IMPLANT
DRAPE ORTHO SPLIT 77X108 STRL (DRAPES) ×4
DRAPE STERI 35X30 U-POUCH (DRAPES) ×2 IMPLANT
DRAPE SURG ORHT 6 SPLT 77X108 (DRAPES) ×2 IMPLANT
DRAPE U-SHAPE 47X51 STRL (DRAPES) ×4 IMPLANT
DRSG PAD ABDOMINAL 8X10 ST (GAUZE/BANDAGES/DRESSINGS) ×7 IMPLANT
DURAPREP 26ML APPLICATOR (WOUND CARE) ×2 IMPLANT
ELECT REM PT RETURN 15FT ADLT (MISCELLANEOUS) ×2 IMPLANT
GAUZE SPONGE 4X4 12PLY STRL (GAUZE/BANDAGES/DRESSINGS) ×2 IMPLANT
GAUZE XEROFORM 1X8 LF (GAUZE/BANDAGES/DRESSINGS) ×2 IMPLANT
GLOVE BIO SURGEON STRL SZ7.5 (GLOVE) ×2 IMPLANT
GLOVE BIOGEL PI IND STRL 7.0 (GLOVE) ×1 IMPLANT
GLOVE BIOGEL PI IND STRL 8 (GLOVE) ×1 IMPLANT
GLOVE BIOGEL PI INDICATOR 7.0 (GLOVE) ×1
GLOVE BIOGEL PI INDICATOR 8 (GLOVE) ×1
GLOVE SURG SS PI 7.0 STRL IVOR (GLOVE) ×2 IMPLANT
GOWN STRL REUS W/TWL LRG LVL3 (GOWN DISPOSABLE) ×2 IMPLANT
GOWN STRL REUS W/TWL XL LVL3 (GOWN DISPOSABLE) ×2 IMPLANT
KIT BASIN OR (CUSTOM PROCEDURE TRAY) ×2 IMPLANT
KIT PUSHLOCK 2.9 HIP (KITS) IMPLANT
KIT STR SPEAR 1.8 FBRTK DISP (KITS) IMPLANT
KIT TURNOVER KIT A (KITS) IMPLANT
LASSO 90 CVE QUICKPAS (DISPOSABLE) IMPLANT
LASSO CRESCENT QUICKPASS (SUTURE) IMPLANT
MANIFOLD NEPTUNE II (INSTRUMENTS) ×2 IMPLANT
NDL HYPO 25X1 1.5 SAFETY (NEEDLE) IMPLANT
NDL SCORPION MULTI FIRE (NEEDLE) IMPLANT
NEEDLE HYPO 25X1 1.5 SAFETY (NEEDLE) IMPLANT
NEEDLE SCORPION MULTI FIRE (NEEDLE) ×2 IMPLANT
PACK ARTHROSCOPY DSU (CUSTOM PROCEDURE TRAY) ×2 IMPLANT
PAD COLD SHLDR WRAP-ON (PAD) IMPLANT
PENCIL SMOKE EVACUATOR (MISCELLANEOUS) IMPLANT
PROBE BIPOLAR ATHRO 135MM 90D (MISCELLANEOUS) ×2 IMPLANT
PROTECTOR NERVE ULNAR (MISCELLANEOUS) ×2 IMPLANT
RESTRAINT HEAD UNIVERSAL NS (MISCELLANEOUS) IMPLANT
SLING ARM FOAM STRAP LRG (SOFTGOODS) IMPLANT
SLING ARM FOAM STRAP MED (SOFTGOODS) IMPLANT
SLING ARM FOAM STRAP XLG (SOFTGOODS) ×1 IMPLANT
SLING ARM IMMOBILIZER LRG (SOFTGOODS) IMPLANT
SLING ARM IMMOBILIZER MED (SOFTGOODS) IMPLANT
SUPPORT WRAP ARM LG (MISCELLANEOUS) ×2 IMPLANT
SUT ETHILON 3 0 PS 1 (SUTURE) ×2 IMPLANT
SUT PDS AB 1 CT1 27 (SUTURE) IMPLANT
SUT TIGER TAPE 7 IN WHITE (SUTURE) IMPLANT
SUTURE TAPE 1.3 40 TPR END (SUTURE) IMPLANT
SUTURETAPE 1.3 40 TPR END (SUTURE) ×4
TAPE CLOTH SURG 6X10 WHT LF (GAUZE/BANDAGES/DRESSINGS) ×1 IMPLANT
TAPE FIBER 2MM 7IN #2 BLUE (SUTURE) IMPLANT
TAPE LABRALWHITE 1.5X36 (TAPE) IMPLANT
TAPE SUT LABRALTAP WHT/BLK (SUTURE) IMPLANT
TOWEL OR 17X26 10 PK STRL BLUE (TOWEL DISPOSABLE) ×2 IMPLANT
TOWEL OR NON WOVEN STRL DISP B (DISPOSABLE) ×2 IMPLANT
TUBING ARTHROSCOPY IRRIG 16FT (MISCELLANEOUS) ×2 IMPLANT
TUBING CONNECTING 10 (TUBING) ×4 IMPLANT

## 2019-10-03 NOTE — H&P (Signed)
John LONNELL BEA is an 48 y.o. male.   Chief Complaint: R shoulder pain  HPI: R shoulder symptomatic full thickness rotator cuff tear.  Failed conservative treatment.  Past Medical History:  Diagnosis Date  . Anxiety   . Diabetes mellitus without complication ([place])   . GERD (gastroesophageal reflux disease)   . Hypertension   . Sleep apnea     Past Surgical History:  Procedure Laterality Date  . FRACTURE SURGERY Right 1990s    Family History  Problem Relation Age of Onset  . Heart disease Father   . Stroke Father   . Diabetes Father    Social History:  reports that he has never smoked. He has never used smokeless tobacco. He reports that he does not drink alcohol or use drugs.  Allergies: No Known Allergies  Medications Prior to Admission  Medication Sig Dispense Refill  . lisinopril-hydrochlorothiazide (PRINZIDE,ZESTORETIC) 10-12.5 MG tablet Take 1 tablet by mouth daily. 90 tablet 0  . metFORMIN (GLUCOPHAGE-XR) 500 MG 24 hr tablet Take 1 tablet (500 mg total) by mouth 3 (three) times daily. 270 tablet 1  . omeprazole (PRILOSEC) 40 MG capsule Take 1 capsule (40 mg total) by mouth daily. 90 capsule 1  . sertraline (ZOLOFT) 100 MG tablet Take 1 tablet (100 mg total) by mouth daily. 90 tablet 1    Results for orders placed or performed during the hospital encounter of 10/03/19 (from the past 48 hour(s))  Glucose, capillary     Status: Abnormal   Collection Time: 10/03/19  8:12 AM  Result Value Ref Range   Glucose-Capillary 150 (H) 70 - 99 mg/dL   Comment 1 Notify RN    Comment 2 Document in Chart    No results found.  Review of Systems  All other systems reviewed and are negative.   Blood pressure (!) 140/94, pulse 89, temperature 97.9 F (36.6 C), temperature source Oral, resp. rate (!) 21, height 6\' 2"  (1.88 m), weight (!) 174.2 kg, SpO2 94 %. Physical Exam  Constitutional: He is oriented to person, place, and time. He appears well-developed and well-nourished.   HENT:  Head: Atraumatic.  Eyes: EOM are normal.  Cardiovascular: Intact distal pulses.  Respiratory: Effort normal.  Musculoskeletal:     Comments: R shoulder pain with rotator cuff testing.  Neurological: He is alert and oriented to person, place, and time.  Skin: Skin is warm and dry.  Psychiatric: He has a normal mood and affect.     Assessment/Plan R shoulder symptomatic full thickness rotator cuff tear.  Failed conservative treatment. Plan R arthroscopic RCR, SAD Risks / benefits of surgery discussed Consent on chart  NPO for OR Preop antibiotics   Isabella Stalling, MD 10/03/2019, 9:58 AM

## 2019-10-03 NOTE — Anesthesia Procedure Notes (Signed)
Anesthesia Regional Block: Interscalene brachial plexus block   Pre-Anesthetic Checklist: ,, timeout performed, Correct Patient, Correct Site, Correct Laterality, Correct Procedure, Correct Position, site marked, Risks and benefits discussed,  Surgical consent,  Pre-op evaluation,  At surgeon's request and post-op pain management  Laterality: Right  Prep: chloraprep       Needles:  Injection technique: Single-shot  Needle Type: Stimiplex          Additional Needles:   Procedures:,,,, ultrasound used (permanent image in chart),,,,  Narrative:  Start time: 10/03/2019 8:58 AM End time: 10/03/2019 9:03 AM  Performed by: Personally   Additional Notes: Patient tolerated the procedure well without complications

## 2019-10-03 NOTE — Anesthesia Procedure Notes (Signed)
Procedure Name: Intubation Date/Time: 10/03/2019 10:57 AM Performed by: Gerald Leitz, CRNA Pre-anesthesia Checklist: Patient identified, Patient being monitored, Timeout performed, Emergency Drugs available and Suction available Patient Re-evaluated:Patient Re-evaluated prior to induction Oxygen Delivery Method: Circle system utilized Preoxygenation: Pre-oxygenation with 100% oxygen Induction Type: IV induction Ventilation: Mask ventilation without difficulty Laryngoscope Size: Mac and 3 Grade View: Grade I Tube type: Oral Tube size: 7.5 mm Number of attempts: 1 Placement Confirmation: ETT inserted through vocal cords under direct vision,  positive ETCO2 and breath sounds checked- equal and bilateral Secured at: 23 cm Tube secured with: Tape Dental Injury: Teeth and Oropharynx as per pre-operative assessment

## 2019-10-03 NOTE — Discharge Instructions (Signed)

## 2019-10-03 NOTE — Transfer of Care (Signed)
Immediate Anesthesia Transfer of Care Note  Patient: John Mullins  Procedure(s) Performed: Procedure(s): ARTHROSCOPIC ROTATOR CUFF REPAIR (Right) SHOULDER ARTHROSCOPY WITH SUBACROMIAL DECOMPRESSION (Right)  Patient Location: PACU  Anesthesia Type:General  Level of Consciousness: Alert, Awake, Oriented  Airway & Oxygen Therapy: Patient Spontanous Breathing  Post-op Assessment: Report given to RN  Post vital signs: Reviewed and stable  Last Vitals:  Vitals:   10/03/19 1005 10/03/19 1223  BP:  (!) (P) 149/76  Pulse: 84   Resp: (!) 24   Temp:  (P) 36.8 C  SpO2: 0000000     Complications: No apparent anesthesia complications

## 2019-10-03 NOTE — Progress Notes (Signed)
Assisted Dr. Germeroth with right, ultrasound guided, interscalene  block. Side rails up, monitors on throughout procedure. See vital signs in flow sheet. Tolerated Procedure well.  

## 2019-10-03 NOTE — Anesthesia Preprocedure Evaluation (Signed)
Anesthesia Evaluation  Patient identified by MRN, date of birth, ID band Patient awake    Reviewed: Allergy & Precautions, NPO status , Patient's Chart, lab work & pertinent test results  Airway Mallampati: II  TM Distance: >3 FB Neck ROM: Full    Dental no notable dental hx. (+) Dental Advisory Given   Pulmonary sleep apnea ,    Pulmonary exam normal breath sounds clear to auscultation       Cardiovascular hypertension, negative cardio ROS Normal cardiovascular exam Rhythm:Regular Rate:Normal     Neuro/Psych PSYCHIATRIC DISORDERS Anxiety negative neurological ROS     GI/Hepatic Neg liver ROS, GERD  ,  Endo/Other  diabetesMorbid obesity  Renal/GU negative Renal ROS     Musculoskeletal negative musculoskeletal ROS (+)   Abdominal   Peds  Hematology negative hematology ROS (+)   Anesthesia Other Findings   Reproductive/Obstetrics                             Anesthesia Physical Anesthesia Plan  ASA: III  Anesthesia Plan: General   Post-op Pain Management: GA combined w/ Regional for post-op pain   Induction: Intravenous  PONV Risk Score and Plan: 3  Airway Management Planned: Oral ETT  Additional Equipment: None  Intra-op Plan:   Post-operative Plan: Extubation in OR  Informed Consent: I have reviewed the patients History and Physical, chart, labs and discussed the procedure including the risks, benefits and alternatives for the proposed anesthesia with the patient or authorized representative who has indicated his/her understanding and acceptance.     Dental advisory given  Plan Discussed with: CRNA  Anesthesia Plan Comments:         Anesthesia Quick Evaluation

## 2019-10-03 NOTE — Anesthesia Postprocedure Evaluation (Signed)
Anesthesia Post Note  Patient: John Mullins  Procedure(s) Performed: ARTHROSCOPIC ROTATOR CUFF REPAIR (Right Shoulder) SHOULDER ARTHROSCOPY WITH SUBACROMIAL DECOMPRESSION (Right Shoulder)     Patient location during evaluation: PACU Anesthesia Type: General Level of consciousness: sedated and patient cooperative Pain management: pain level controlled Vital Signs Assessment: post-procedure vital signs reviewed and stable Respiratory status: spontaneous breathing Cardiovascular status: stable Anesthetic complications: no    Last Vitals:  Vitals:   10/03/19 1305 10/03/19 1315  BP:  (!) 142/85  Pulse: 88 86  Resp: 20 15  Temp:  37 C  SpO2: 92% 95%    Last Pain:  Vitals:   10/03/19 1300  TempSrc:   PainSc: 0-No pain                 Nolon Nations

## 2019-10-03 NOTE — Op Note (Signed)
Procedure(s): ARTHROSCOPIC ROTATOR CUFF REPAIR SHOULDER ARTHROSCOPY WITH SUBACROMIAL DECOMPRESSION Procedure Note  John Mullins male 48 y.o. 10/03/2019   Preoperative diagnosis: #1 right shoulder rotator cuff tear #2 right shoulder impingement with unfavorable acromial anatomy  Postoperative diagnosis: Same  Procedure(s) and Anesthesia Type:    * RIGHT ARTHROSCOPIC ROTATOR CUFF REPAIR - Choice    *RIGHT SHOULDER ARTHROSCOPY WITH SUBACROMIAL DECOMPRESSION - Choice  Surgeon(s) and Role:    Tania Ade, MD - Primary     Surgeon: Isabella Stalling   Assistants: Jeanmarie Hubert PA-C (Danielle was present and scrubbed throughout the procedure and was essential in positioning, assisting with the camera and instrumentation,, and closure)  Anesthesia: General endotracheal anesthesia with preoperative interscalene block given by the attending anesthesiologist    Procedure Detail  ARTHROSCOPIC ROTATOR CUFF REPAIR, SHOULDER ARTHROSCOPY WITH SUBACROMIAL DECOMPRESSION  Estimated Blood Loss: Min         Drains: none  Blood Given: none         Specimens: none        Complications:  * No complications entered in OR log *         Disposition: PACU - hemodynamically stable.         Condition: stable    Procedure:   INDICATIONS FOR SURGERY: The patient is 48 y.o. male who has a long history of right shoulder pain with MRI documented full-thickness small rotator cuff tear.  Indicated for surgical treatment to try and decrease pain and restore function.  OPERATIVE FINDINGS: Examination under anesthesia: No stiffness or instability.   DESCRIPTION OF PROCEDURE: The patient was identified in preoperative  holding area where I personally marked the operative site after  verifying site, side, and procedure with the patient. An interscalene block was given by the attending anesthesiologist the holding area.  The patient was taken back to the operating room where general  anesthesia was induced without complication and was placed in the beach-chair position with the back  elevated about 60 degrees and all extremities and head and neck carefully padded and  positioned.   The right upper extremity was then prepped and  draped in a standard sterile fashion. The appropriate time-out  procedure was carried out. The patient did receive IV antibiotics  within 30 minutes of incision.   A small posterior portal incision was made and the arthroscope was introduced into the joint. An anterior portal was then established above the subscapularis using needle localization. Small cannula was placed anteriorly. Diagnostic arthroscopy was then carried out.  The subscapularis was noted to be intact.  Glenohumeral joint surfaces were intact without any significant chondromalacia.  The superior labrum was intact.  The biceps tendon was pulled into the joint and had severe partial tearing involving about 50% of the thickness of the tendon.  This was felt to be likely a pain generator and not amenable to any type of repair.  Therefore biceps tenotomy was carried out for pain relief using arthroscopic scissors through the anterior portal.  The superior labrum was then debrided.  The undersurface of the supraspinatus was examined and noted to have a small full-thickness tear.  This was lightly debrided from the undersurface.  The arthroscope was then introduced into the subacromial space a standard lateral portal was established with needle localization. The shaver was used through the lateral portal to perform extensive bursectomy. Coracoacromial ligament was examined and found to be frayed indicating chronic impingement.  The bursal surface of the rotator cuff was  examined and noted to have a small full-thickness tear laterally.  A large cannula was placed laterally.  The camera was moved to a posterior lateral viewing portal.  The tuberosity was debrided down to a bleeding surface of the  bur.  The repair was then carried out by first placing a 4.75 peek swivel lock anchor just off the articular margin preloaded with 2 suture tapes.  These 4 suture strands were passed evenly throughout the tear.  An additional #2 FiberWire was placed centrally in the tear.  The suture strands were then placed in a second 4.75 peek swivel lock anchor laterally bringing the tendon nicely down over the prepared tuberosity.  The repair was felt to be complete and watertight with no tension.  The coracoacromial ligament was taken down off the anterior acromion with the ArthroCare exposing a moderate anterior acromial spur. A high-speed bur was then used through the lateral portal to take down the anterior acromial spur from lateral to medial in a standard acromioplasty.  The acromioplasty was also viewed from the lateral portal and the bur was used as necessary to ensure that the acromion was completely flat from posterior to anterior.   The arthroscopic equipment was removed from the joint and the portals were closed with 3-0 nylon in an interrupted fashion. Sterile dressings were then applied including Xeroform 4 x 4's ABDs and tape. The patient was then allowed to awaken from general anesthesia, placed in a sling, transferred to the stretcher and taken to the recovery room in stable condition.   POSTOPERATIVE PLAN: The patient will be discharged home today and will followup in one week for suture removal and wound check.

## 2019-10-04 ENCOUNTER — Other Ambulatory Visit: Payer: Self-pay | Admitting: Family Medicine

## 2019-10-04 ENCOUNTER — Encounter (HOSPITAL_COMMUNITY): Payer: Self-pay | Admitting: Orthopedic Surgery

## 2019-10-04 DIAGNOSIS — I10 Essential (primary) hypertension: Secondary | ICD-10-CM

## 2019-10-04 DIAGNOSIS — R7303 Prediabetes: Secondary | ICD-10-CM

## 2019-10-11 DIAGNOSIS — Z9889 Other specified postprocedural states: Secondary | ICD-10-CM | POA: Diagnosis not present

## 2019-10-17 DIAGNOSIS — M75101 Unspecified rotator cuff tear or rupture of right shoulder, not specified as traumatic: Secondary | ICD-10-CM | POA: Diagnosis not present

## 2019-10-17 DIAGNOSIS — M25511 Pain in right shoulder: Secondary | ICD-10-CM | POA: Diagnosis not present

## 2019-10-22 DIAGNOSIS — M75101 Unspecified rotator cuff tear or rupture of right shoulder, not specified as traumatic: Secondary | ICD-10-CM | POA: Diagnosis not present

## 2019-10-22 DIAGNOSIS — M25511 Pain in right shoulder: Secondary | ICD-10-CM | POA: Diagnosis not present

## 2019-10-30 DIAGNOSIS — M75101 Unspecified rotator cuff tear or rupture of right shoulder, not specified as traumatic: Secondary | ICD-10-CM | POA: Diagnosis not present

## 2019-10-30 DIAGNOSIS — M25511 Pain in right shoulder: Secondary | ICD-10-CM | POA: Diagnosis not present

## 2019-11-04 DIAGNOSIS — M75101 Unspecified rotator cuff tear or rupture of right shoulder, not specified as traumatic: Secondary | ICD-10-CM | POA: Diagnosis not present

## 2019-11-04 DIAGNOSIS — M25511 Pain in right shoulder: Secondary | ICD-10-CM | POA: Diagnosis not present

## 2019-11-15 DIAGNOSIS — M25511 Pain in right shoulder: Secondary | ICD-10-CM | POA: Diagnosis not present

## 2019-11-15 DIAGNOSIS — M75101 Unspecified rotator cuff tear or rupture of right shoulder, not specified as traumatic: Secondary | ICD-10-CM | POA: Diagnosis not present

## 2019-11-19 DIAGNOSIS — M25511 Pain in right shoulder: Secondary | ICD-10-CM | POA: Diagnosis not present

## 2019-11-19 DIAGNOSIS — M75101 Unspecified rotator cuff tear or rupture of right shoulder, not specified as traumatic: Secondary | ICD-10-CM | POA: Diagnosis not present

## 2019-11-23 ENCOUNTER — Encounter (HOSPITAL_BASED_OUTPATIENT_CLINIC_OR_DEPARTMENT_OTHER): Payer: Self-pay

## 2019-11-23 ENCOUNTER — Emergency Department (HOSPITAL_BASED_OUTPATIENT_CLINIC_OR_DEPARTMENT_OTHER): Payer: BC Managed Care – PPO

## 2019-11-23 ENCOUNTER — Emergency Department (HOSPITAL_BASED_OUTPATIENT_CLINIC_OR_DEPARTMENT_OTHER)
Admission: EM | Admit: 2019-11-23 | Discharge: 2019-11-23 | Disposition: A | Discharge status: Home / Self Care | Payer: BC Managed Care – PPO | Attending: Emergency Medicine | Admitting: Emergency Medicine

## 2019-11-23 ENCOUNTER — Other Ambulatory Visit: Payer: Self-pay

## 2019-11-23 DIAGNOSIS — K5792 Diverticulitis of intestine, part unspecified, without perforation or abscess without bleeding: Secondary | ICD-10-CM | POA: Diagnosis not present

## 2019-11-23 DIAGNOSIS — R109 Unspecified abdominal pain: Secondary | ICD-10-CM | POA: Diagnosis not present

## 2019-11-23 DIAGNOSIS — K5732 Diverticulitis of large intestine without perforation or abscess without bleeding: Secondary | ICD-10-CM | POA: Insufficient documentation

## 2019-11-23 DIAGNOSIS — Z79899 Other long term (current) drug therapy: Secondary | ICD-10-CM | POA: Diagnosis not present

## 2019-11-23 DIAGNOSIS — Z7984 Long term (current) use of oral hypoglycemic drugs: Secondary | ICD-10-CM | POA: Diagnosis not present

## 2019-11-23 DIAGNOSIS — E119 Type 2 diabetes mellitus without complications: Secondary | ICD-10-CM | POA: Diagnosis not present

## 2019-11-23 DIAGNOSIS — I10 Essential (primary) hypertension: Secondary | ICD-10-CM | POA: Insufficient documentation

## 2019-11-23 DIAGNOSIS — R1031 Right lower quadrant pain: Secondary | ICD-10-CM | POA: Diagnosis not present

## 2019-11-23 LAB — COMPREHENSIVE METABOLIC PANEL
ALT: 41 U/L (ref 0–44)
AST: 22 U/L (ref 15–41)
Albumin: 3.7 g/dL (ref 3.5–5.0)
Alkaline Phosphatase: 101 U/L (ref 38–126)
Anion gap: 8 (ref 5–15)
BUN: 13 mg/dL (ref 6–20)
CO2: 22 mmol/L (ref 22–32)
Calcium: 9 mg/dL (ref 8.9–10.3)
Chloride: 104 mmol/L (ref 98–111)
Creatinine, Ser: 0.82 mg/dL (ref 0.61–1.24)
GFR calc Af Amer: >60 mL/min (ref >60)
GFR calc non Af Amer: >60 mL/min (ref >60)
Glucose, Bld: 202 mg/dL — ABNORMAL HIGH (ref 70–99)
Potassium: 3.7 mmol/L (ref 3.5–5.1)
Sodium: 134 mmol/L — ABNORMAL LOW (ref 135–145)
Total Bilirubin: 0.6 mg/dL (ref 0.3–1.2)
Total Protein: 7.8 g/dL (ref 6.5–8.1)

## 2019-11-23 LAB — CBC WITH DIFFERENTIAL/PLATELET
Abs Immature Granulocytes: 0.1 10*3/uL — ABNORMAL HIGH (ref 0.00–0.07)
Basophils Absolute: 0.1 10*3/uL (ref 0.0–0.1)
Basophils Relative: 1 %
Eosinophils Absolute: 0.1 10*3/uL (ref 0.0–0.5)
Eosinophils Relative: 1 %
HCT: 40.3 % (ref 39.0–52.0)
Hemoglobin: 12.8 g/dL — ABNORMAL LOW (ref 13.0–17.0)
Immature Granulocytes: 1 %
Lymphocytes Relative: 15 %
Lymphs Abs: 2.6 10*3/uL (ref 0.7–4.0)
MCH: 26.6 pg (ref 26.0–34.0)
MCHC: 31.8 g/dL (ref 30.0–36.0)
MCV: 83.8 fL (ref 80.0–100.0)
Monocytes Absolute: 1.1 10*3/uL — ABNORMAL HIGH (ref 0.1–1.0)
Monocytes Relative: 6 %
Neutro Abs: 13.3 10*3/uL — ABNORMAL HIGH (ref 1.7–7.7)
Neutrophils Relative %: 76 %
Platelets: 422 10*3/uL — ABNORMAL HIGH (ref 150–400)
RBC: 4.81 MIL/uL (ref 4.22–5.81)
RDW: 14.5 % (ref 11.5–15.5)
WBC: 17.3 10*3/uL — ABNORMAL HIGH (ref 4.0–10.5)
nRBC: 0 % (ref 0.0–0.2)

## 2019-11-23 LAB — LIPASE, BLOOD: Lipase: 23 U/L (ref 11–51)

## 2019-11-23 MED ORDER — AMOXICILLIN-POT CLAVULANATE 875-125 MG PO TABS: 1.0000 | ORAL_TABLET | Freq: Once | ORAL | Status: DC

## 2019-11-23 MED ORDER — IOHEXOL 300 MG/ML  SOLN
100.0000 mL | Freq: Once | INTRAMUSCULAR | Status: AC | PRN
  Administered 2019-11-23: 100 mL via INTRAVENOUS

## 2019-11-23 MED ORDER — AMOXICILLIN-POT CLAVULANATE 875-125 MG PO TABS: 1.0000 | ORAL_TABLET | Freq: Two times a day (BID) | ORAL | 0 refills | Status: DC

## 2019-11-23 MED ORDER — SODIUM CHLORIDE 0.9 % IV BOLUS
500.0000 mL | Freq: Once | INTRAVENOUS | Status: AC
  Administered 2019-11-23: 500 mL via INTRAVENOUS

## 2019-11-23 NOTE — Discharge Instructions (Addendum)
You were seen in the emergency department for some abdominal crampy pain and mucousy diarrhea.  Your blood work showed an elevated infection cell count and your CAT scan showed acute diverticulitis.  This is treated by antibiotics.  You should modify your diet to liquids until your pain is resolved and then can slowly advance.  Follow-up with your primary care doctor.  Your CAT scan also showed an incidental finding of pass on the left adrenal that will need to be followed up by your doctor with further testing.  Included below is the impression from the radiologist.    IMPRESSION:  1. Acute sigmoid colon diverticulitis. No focal fluid collection to  suggest an abscess.  2. Hepatic steatosis.  3. Indeterminate 3.5 cm left adrenal mass. Recommend further  characterization with a non emergent MRI of the abdomen.

## 2019-11-23 NOTE — ED Triage Notes (Signed)
Pt arrives ambulatory to ED with reports of abdominal pain X 1 week reports NVD with history of diverticulitis.

## 2019-11-23 NOTE — ED Provider Notes (Signed)
[facility] EMERGENCY DEPARTMENT Provider Note   CSN: [id number] Arrival date & time: 11/23/19  1025     History Chief Complaint  Patient presents with  . Abdominal Pain    Nil John Mullins is a 49 y.o. male.  He is complaining of 1 week of intermittent crampy low abdominal pain sometimes severe in nature.  It is associated with multiple loose mucoid stools.  No blood from above or below.  Nausea but no known fever.  No upper respiratory symptoms.  He said he had surgery in December for his shoulder and his doctor told him that the antibiotics they gave him might have affected his good bacteria in his intestines.  The history is provided by the patient.  Abdominal Pain Pain location:  LUQ, RLQ and suprapubic Pain quality: cramping   Pain radiates to:  Does not radiate Pain severity:  Severe Onset quality:  Gradual Duration:  1 week Timing:  Intermittent Progression:  Unchanged Chronicity:  New Context: not recent travel, not sick contacts, not suspicious food intake and not trauma   Relieved by:  Nothing Worsened by:  Nothing Ineffective treatments:  None tried Associated symptoms: diarrhea and nausea   Associated symptoms: no chest pain, no chills, no constipation, no cough, no dysuria, no fever, no hematemesis, no hematochezia, no hematuria, no shortness of breath, no sore throat and no vomiting        Past Medical History:  Diagnosis Date  . Anxiety   . Diabetes mellitus without complication ([place])   . GERD (gastroesophageal reflux disease)   . Hypertension   . Sleep apnea     Patient Active Problem List   Diagnosis Date Noted  . Prediabetes 09/21/2016  . Morbid obesity due to excess calories ([place]) 09/01/2015  . Hypersomnia with sleep apnea 09/01/2015  . Snoring 09/01/2015  . HTN (hypertension) 06/04/2013  . Anxiety state, unspecified 06/04/2013  . GERD (gastroesophageal reflux disease) 06/04/2013  . Obesity, Class III, BMI 40-49.9 (morbid obesity)  ([place]) 06/04/2013  . HIATAL HERNIA 11/28/2006    Past Surgical History:  Procedure Laterality Date  . ARTHOSCOPIC ROTAOR CUFF REPAIR Right 10/03/2019   Procedure: ARTHROSCOPIC ROTATOR CUFF REPAIR;  Surgeon: Tania Ade, MD;  Location: [facility] ORS;  Service: Orthopedics;  Laterality: Right;  . FRACTURE SURGERY Right 1990s  . SHOULDER ARTHROSCOPY WITH SUBACROMIAL DECOMPRESSION Right 10/03/2019   Procedure: SHOULDER ARTHROSCOPY WITH SUBACROMIAL DECOMPRESSION;  Surgeon: Tania Ade, MD;  Location: [facility];  Service: Orthopedics;  Laterality: Right;       Family History  Problem Relation Age of Onset  . Heart disease Father   . Stroke Father   . Diabetes Father     Social History   Tobacco Use  . Smoking status: Never Smoker  . Smokeless tobacco: Never Used  Substance Use Topics  . Alcohol use: No  . Drug use: No    Home Medications Prior to Admission medications   Medication Sig Start Date End Date Taking? Authorizing Provider  lisinopril-hydrochlorothiazide (ZESTORETIC) 10-12.5 MG tablet Take 1 tablet by mouth once daily 10/04/19   Brunetta Jeans, PA-C  metFORMIN (GLUCOPHAGE-XR) 500 MG 24 hr tablet TAKE 2 TABLETS BY MOUTH ONCE DAILY WITH BREAKFAST 10/04/19   Brunetta Jeans, PA-C  omeprazole (PRILOSEC) 40 MG capsule Take 1 capsule (40 mg total) by mouth daily. 02/13/19   Rutherford Guys, MD  oxyCODONE-acetaminophen (PERCOCET) 5-325 MG tablet Take 1-2 tablets every 4 hours as needed for post operative pain. MAX 6/day 10/03/19  Grier Mitts, PA-C  sertraline (ZOLOFT) 100 MG tablet Take 1 tablet (100 mg total) by mouth daily. 02/13/19   Rutherford Guys, MD  tiZANidine (ZANAFLEX) 4 MG tablet Take 1 tablet (4 mg total) by mouth every 8 (eight) hours as needed for muscle spasms. 10/03/19   Grier Mitts, PA-C    Allergies    Patient has no known allergies.  Review of Systems   Review of Systems  Constitutional: Negative for chills and fever.  HENT: Negative for  sore throat.   Eyes: Negative for visual disturbance.  Respiratory: Negative for cough and shortness of breath.   Cardiovascular: Negative for chest pain.  Gastrointestinal: Positive for abdominal pain, diarrhea and nausea. Negative for constipation, hematemesis, hematochezia and vomiting.  Genitourinary: Negative for dysuria and hematuria.  Musculoskeletal: Negative for back pain.  Skin: Negative for rash.  Neurological: Negative for headaches.    Physical Exam Updated Vital Signs BP (!) 149/105 (BP Location: Left Arm)   Pulse (!) 118   Temp 99.1 F (37.3 C) (Axillary)   Resp 20   Ht 6\' 2"  (1.88 m)   Wt (!) 172.4 kg   SpO2 95%   BMI 48.79 kg/m   Physical Exam Vitals and nursing note reviewed.  Constitutional:      Appearance: He is well-developed. He is obese.  HENT:     Head: Normocephalic and atraumatic.  Eyes:     Conjunctiva/sclera: Conjunctivae normal.  Cardiovascular:     Rate and Rhythm: Normal rate and regular rhythm.     Heart sounds: No murmur.  Pulmonary:     Effort: Pulmonary effort is normal. No respiratory distress.     Breath sounds: Normal breath sounds.  Abdominal:     Palpations: Abdomen is soft.     Tenderness: There is no abdominal tenderness. There is no guarding or rebound.  Musculoskeletal:        General: No deformity or signs of injury.     Cervical back: Neck supple.  Skin:    General: Skin is warm and dry.     Capillary Refill: Capillary refill takes less than 2 seconds.  Neurological:     General: No focal deficit present.     Mental Status: He is alert.     ED Results / Procedures / Treatments   Labs (all labs ordered are listed, but only abnormal results are displayed) Labs Reviewed  COMPREHENSIVE METABOLIC PANEL - Abnormal; Notable for the following components:      Result Value   Sodium 134 (*)    Glucose, Bld 202 (*)    All other components within normal limits  CBC WITH DIFFERENTIAL/PLATELET - Abnormal; Notable for the  following components:   WBC 17.3 (*)    Hemoglobin 12.8 (*)    Platelets 422 (*)    Neutro Abs 13.3 (*)    Monocytes Absolute 1.1 (*)    Abs Immature Granulocytes 0.10 (*)    All other components within normal limits  C DIFFICILE QUICK SCREEN W PCR REFLEX  GI PATHOGEN PANEL BY PCR, STOOL  LIPASE, BLOOD  URINALYSIS, ROUTINE W REFLEX MICROSCOPIC    EKG None  Radiology CT Abdomen Pelvis W Contrast  Result Date: 11/23/2019 CLINICAL DATA:  Diverticulitis, lower abdominal pain for 1 week EXAM: CT ABDOMEN AND PELVIS WITH CONTRAST TECHNIQUE: Multidetector CT imaging of the abdomen and pelvis was performed using the standard protocol following bolus administration of intravenous contrast. CONTRAST:  142mL OMNIPAQUE IOHEXOL 300 MG/ML  SOLN COMPARISON:  None. FINDINGS: Lower chest: No acute abnormality. Hepatobiliary: Diffuse low attenuation of liver as can be seen with hepatic steatosis. No focal hepatic mass. Normal gallbladder. No intrahepatic or extrahepatic biliary ductal dilatation. Pancreas: Unremarkable. No pancreatic ductal dilatation or surrounding inflammatory changes. Spleen: Normal in size without focal abnormality. Adrenals/Urinary Tract: 3.5 cm indeterminate left adrenal mass. Normal right adrenal gland. Normal kidneys. No urolithiasis. No obstructive uropathy. Normal bladder. Stomach/Bowel: No bowel dilatation to suggest bowel obstruction. Normal stomach. Normal appendix. Diverticulosis of the descending and sigmoid colon. Severe bowel wall thickening of the proximal sigmoid colon with surrounding inflammatory changes most consistent with acute diverticulitis. No focal fluid collection to suggest an abscess. Small reactive mesenteric lymph nodes adjacent to the inflamed segment of the sigmoid colon. Vascular/Lymphatic: No significant vascular findings are present. No enlarged abdominal or pelvic lymph nodes. Reproductive: Prostate is unremarkable. Other: No abdominal wall hernia or  abnormality. No abdominopelvic ascites. Musculoskeletal: No acute osseous abnormality. No aggressive osseous lesion. IMPRESSION: 1. Acute sigmoid colon diverticulitis. No focal fluid collection to suggest an abscess. 2. Hepatic steatosis. 3. Indeterminate 3.5 cm left adrenal mass. Recommend further characterization with a non emergent MRI of the abdomen. Electronically Signed   By: Kathreen Devoid   On: 11/23/2019 12:20    Procedures Procedures (including critical care time)  Medications Ordered in ED Medications  amoxicillin-clavulanate (AUGMENTIN) 875-125 MG per tablet 1 tablet (1 tablet Oral Not Given 11/23/19 1319)  sodium chloride 0.9 % bolus 500 mL ( Intravenous Stopped 11/23/19 1231)  iohexol (OMNIPAQUE) 300 MG/ML solution 100 mL (100 mLs Intravenous Contrast Given 11/23/19 1156)    ED Course  I have reviewed the triage vital signs and the nursing notes.  Pertinent labs & imaging results that were available during my care of the patient were reviewed by me and considered in my medical decision making (see chart for details).  Clinical Course as of Nov 22 1724  Sat Nov 23, 2019  1045 Differential diagnosis includes diverticulitis, appendicitis, C. difficile, infectious diarrhea, constipation, gastroenteritis, colitis   [MB]  1045 Rest of the labs are pretty unimpressive other than elevated glucose of 202.  Still has not given a urine sample or stool sample.  CT read as acute uncomplicated diverticulitis.   [MB]  [id number] White blood cell count elevated at 17.3.   [MB]  [id number] Reviewed this with patient along with mentioning the adrenal mass that will need to be followed up with his primary care doctor.  We will put him on Augmentin.   [MB]    Clinical Course User Index [MB] Hayden Rasmussen, MD   MDM Rules/Calculators/A&P                      Final Clinical Impression(s) / ED Diagnoses Final diagnoses:  Acute diverticulitis    Rx / DC Orders ED Discharge Orders         Ordered     amoxicillin-clavulanate (AUGMENTIN) 875-125 MG tablet  Every 12 hours     11/23/19 1251           Hayden Rasmussen, MD 11/23/19 1727

## 2019-11-25 ENCOUNTER — Telehealth: Payer: Self-pay

## 2019-11-25 NOTE — Telephone Encounter (Signed)
LMOVM for patient to call back to schedule VV

## 2019-11-26 ENCOUNTER — Other Ambulatory Visit: Payer: Self-pay

## 2019-11-26 ENCOUNTER — Encounter: Payer: Self-pay | Admitting: Physician Assistant

## 2019-11-26 ENCOUNTER — Ambulatory Visit (INDEPENDENT_AMBULATORY_CARE_PROVIDER_SITE_OTHER): Payer: BC Managed Care – PPO | Admitting: Physician Assistant

## 2019-11-26 VITALS — Temp 97.6°F

## 2019-11-26 DIAGNOSIS — K5732 Diverticulitis of large intestine without perforation or abscess without bleeding: Secondary | ICD-10-CM

## 2019-11-26 DIAGNOSIS — E278 Other specified disorders of adrenal gland: Secondary | ICD-10-CM | POA: Diagnosis not present

## 2019-11-26 NOTE — Progress Notes (Signed)
I have discussed the procedure for the virtual visit with the patient who has given consent to proceed with assessment and treatment.   John Mullins S Madilyn Cephas, CMA     

## 2019-11-26 NOTE — Progress Notes (Signed)
Virtual Visit via Video   I connected with patient on 11/26/19 at  8:00 AM EST by a video enabled telemedicine application and verified that I am speaking with the correct person using two identifiers.  Location patient: Home Location provider: [facility], Office Persons participating in the virtual visit: Patient, Provider, [place] (Patina Moore)  I discussed the limitations of evaluation and management by telemedicine and the availability of in person appointments. The patient expressed understanding and agreed to proceed.  Subjective:   HPI:   Patient presents via [facility] today for ER follow-up. Patient presented to [facility] on 11/23/2019 with c/o 1 week of intermittent lower abdominal pain, sometimes severe. ER records reviewed in detail. Workup in the ER included labs (WBC at 17.3 with neutrophilia, Na 134, normal lipase). CT obtained revealing acute uncomplicated sigmoid diverticulitis. A 3.5 cm L adrenal mass also noted incidentally. Patient was given IV fluids and started on course of Augmentin to complete at home. Was discharged same day to follow-up with PCP.   Since ER discharge, patient endorses taking antibiotic as directed.  Notes tolerating well.  States he started to notice a more significant improvement in symptoms.  States pain has been improving since discharge.  No pain today.Denies any nausea in the past 24 to 36 hours.  Still having some loose stools but improving.  Denies melena, hematochezia or tenesmus.  Was able to eat a meal last night.  Is staying well-hydrated.  Denies fever.  On discussion of incidental CT findings, patient states he was not aware of the mass was noted on his left adrenal gland.  Patient denies any new symptoms or concerns.  Denies any worsening symptoms.  ROS:   See pertinent positives and negatives per HPI.  Patient Active Problem List   Diagnosis Date Noted  . Prediabetes 09/21/2016  . Morbid obesity due to excess calories  ([place]) 09/01/2015  . Hypersomnia with sleep apnea 09/01/2015  . Snoring 09/01/2015  . HTN (hypertension) 06/04/2013  . Anxiety state, unspecified 06/04/2013  . GERD (gastroesophageal reflux disease) 06/04/2013  . Obesity, Class III, BMI 40-49.9 (morbid obesity) ([place]) 06/04/2013  . HIATAL HERNIA 11/28/2006    Social History   Tobacco Use  . Smoking status: Never Smoker  . Smokeless tobacco: Never Used  Substance Use Topics  . Alcohol use: No    Current Outpatient Medications:  .  amoxicillin-clavulanate (AUGMENTIN) 875-125 MG tablet, Take 1 tablet by mouth every 12 (twelve) hours., Disp: 14 tablet, Rfl: 0 .  lisinopril-hydrochlorothiazide (ZESTORETIC) 10-12.5 MG tablet, Take 1 tablet by mouth once daily, Disp: 90 tablet, Rfl: 0 .  metFORMIN (GLUCOPHAGE-XR) 500 MG 24 hr tablet, TAKE 2 TABLETS BY MOUTH ONCE DAILY WITH BREAKFAST, Disp: 180 tablet, Rfl: 0 .  omeprazole (PRILOSEC) 40 MG capsule, Take 1 capsule (40 mg total) by mouth daily., Disp: 90 capsule, Rfl: 1 .  oxyCODONE-acetaminophen (PERCOCET) 5-325 MG tablet, Take 1-2 tablets every 4 hours as needed for post operative pain. MAX 6/day, Disp: 30 tablet, Rfl: 0 .  sertraline (ZOLOFT) 100 MG tablet, Take 1 tablet (100 mg total) by mouth daily., Disp: 90 tablet, Rfl: 1 .  tiZANidine (ZANAFLEX) 4 MG tablet, Take 1 tablet (4 mg total) by mouth every 8 (eight) hours as needed for muscle spasms., Disp: 30 tablet, Rfl: 1  No Known Allergies  Objective:   There were no vitals taken for this visit.  Patient is well-developed, well-nourished in no acute distress.  Resting comfortably at home.  Head  is normocephalic, atraumatic.  No labored breathing.  Speech is clear and coherent with logical content.  Patient is alert and oriented at baseline.   Assessment and Plan:   1. Sigmoid diverticulitis Clinically significantly improved.  Patient only on day 3 of antibiotics with significant improvement.  No residual pain, nausea or vomiting.   Afebrile.  No alarm signs or symptoms present.  Encourage patient to complete entire course of antibiotic.  Start daily probiotic.  Bland diet recommended.  Continue good hydration.  Tylenol needing anything for pain.  Patient to notify us ASAP if any recurring symptoms, new symptoms or if anything worsens or is not resolving.  Patient voiced understanding and agreement plan.  2. Left adrenal mass [facility]) Discussed this incidental finding with patient.  Questions answered.  We will proceed with MRI abdomen to further assess.    Leeanne Rio, PA-C 11/26/2019

## 2019-12-06 DIAGNOSIS — M25511 Pain in right shoulder: Secondary | ICD-10-CM | POA: Diagnosis not present

## 2019-12-06 DIAGNOSIS — M75101 Unspecified rotator cuff tear or rupture of right shoulder, not specified as traumatic: Secondary | ICD-10-CM | POA: Diagnosis not present

## 2019-12-17 DIAGNOSIS — M75101 Unspecified rotator cuff tear or rupture of right shoulder, not specified as traumatic: Secondary | ICD-10-CM | POA: Diagnosis not present

## 2019-12-17 DIAGNOSIS — M25511 Pain in right shoulder: Secondary | ICD-10-CM | POA: Diagnosis not present

## 2019-12-24 DIAGNOSIS — M25511 Pain in right shoulder: Secondary | ICD-10-CM | POA: Diagnosis not present

## 2019-12-24 DIAGNOSIS — M75101 Unspecified rotator cuff tear or rupture of right shoulder, not specified as traumatic: Secondary | ICD-10-CM | POA: Diagnosis not present

## 2019-12-30 ENCOUNTER — Ambulatory Visit
Admission: RE | Admit: 2019-12-30 | Discharge: 2019-12-30 | Disposition: A | Discharge status: Home / Self Care | Payer: BC Managed Care – PPO | Source: Ambulatory Visit | Attending: Physician Assistant | Admitting: Physician Assistant

## 2019-12-30 ENCOUNTER — Other Ambulatory Visit: Payer: Self-pay

## 2019-12-30 DIAGNOSIS — E278 Other specified disorders of adrenal gland: Secondary | ICD-10-CM

## 2019-12-30 DIAGNOSIS — D3502 Benign neoplasm of left adrenal gland: Secondary | ICD-10-CM | POA: Diagnosis not present

## 2019-12-30 MED ORDER — GADOBENATE DIMEGLUMINE 529 MG/ML IV SOLN
20.0000 mL | Freq: Once | INTRAVENOUS | Status: AC | PRN
  Administered 2019-12-30: 20 mL via INTRAVENOUS

## 2019-12-31 DIAGNOSIS — M25511 Pain in right shoulder: Secondary | ICD-10-CM | POA: Diagnosis not present

## 2019-12-31 DIAGNOSIS — M75101 Unspecified rotator cuff tear or rupture of right shoulder, not specified as traumatic: Secondary | ICD-10-CM | POA: Diagnosis not present

## 2019-12-31 NOTE — Progress Notes (Signed)
LMOVM for callback 12/31/19 3:21 PM

## 2020-01-02 DIAGNOSIS — M25511 Pain in right shoulder: Secondary | ICD-10-CM | POA: Diagnosis not present

## 2020-01-02 DIAGNOSIS — M75101 Unspecified rotator cuff tear or rupture of right shoulder, not specified as traumatic: Secondary | ICD-10-CM | POA: Diagnosis not present

## 2020-01-06 DIAGNOSIS — M75101 Unspecified rotator cuff tear or rupture of right shoulder, not specified as traumatic: Secondary | ICD-10-CM | POA: Diagnosis not present

## 2020-01-06 DIAGNOSIS — M25511 Pain in right shoulder: Secondary | ICD-10-CM | POA: Diagnosis not present

## 2020-01-08 DIAGNOSIS — M25511 Pain in right shoulder: Secondary | ICD-10-CM | POA: Diagnosis not present

## 2020-01-08 DIAGNOSIS — M75101 Unspecified rotator cuff tear or rupture of right shoulder, not specified as traumatic: Secondary | ICD-10-CM | POA: Diagnosis not present

## 2020-01-15 DIAGNOSIS — M75101 Unspecified rotator cuff tear or rupture of right shoulder, not specified as traumatic: Secondary | ICD-10-CM | POA: Diagnosis not present

## 2020-01-15 DIAGNOSIS — M25511 Pain in right shoulder: Secondary | ICD-10-CM | POA: Diagnosis not present

## 2020-01-17 DIAGNOSIS — M75101 Unspecified rotator cuff tear or rupture of right shoulder, not specified as traumatic: Secondary | ICD-10-CM | POA: Diagnosis not present

## 2020-01-17 DIAGNOSIS — M25511 Pain in right shoulder: Secondary | ICD-10-CM | POA: Diagnosis not present

## 2020-01-22 DIAGNOSIS — M75101 Unspecified rotator cuff tear or rupture of right shoulder, not specified as traumatic: Secondary | ICD-10-CM | POA: Diagnosis not present

## 2020-01-22 DIAGNOSIS — M25511 Pain in right shoulder: Secondary | ICD-10-CM | POA: Diagnosis not present

## 2020-01-24 DIAGNOSIS — M75101 Unspecified rotator cuff tear or rupture of right shoulder, not specified as traumatic: Secondary | ICD-10-CM | POA: Diagnosis not present

## 2020-01-24 DIAGNOSIS — M25511 Pain in right shoulder: Secondary | ICD-10-CM | POA: Diagnosis not present

## 2020-01-30 DIAGNOSIS — M75101 Unspecified rotator cuff tear or rupture of right shoulder, not specified as traumatic: Secondary | ICD-10-CM | POA: Diagnosis not present

## 2020-01-30 DIAGNOSIS — M25511 Pain in right shoulder: Secondary | ICD-10-CM | POA: Diagnosis not present

## 2020-02-03 DIAGNOSIS — Z09 Encounter for follow-up examination after completed treatment for conditions other than malignant neoplasm: Secondary | ICD-10-CM | POA: Diagnosis not present

## 2020-02-06 DIAGNOSIS — M25511 Pain in right shoulder: Secondary | ICD-10-CM | POA: Diagnosis not present

## 2020-02-06 DIAGNOSIS — M75101 Unspecified rotator cuff tear or rupture of right shoulder, not specified as traumatic: Secondary | ICD-10-CM | POA: Diagnosis not present

## 2020-02-10 DIAGNOSIS — M75101 Unspecified rotator cuff tear or rupture of right shoulder, not specified as traumatic: Secondary | ICD-10-CM | POA: Diagnosis not present

## 2020-02-10 DIAGNOSIS — M25511 Pain in right shoulder: Secondary | ICD-10-CM | POA: Diagnosis not present

## 2020-02-17 DIAGNOSIS — M75101 Unspecified rotator cuff tear or rupture of right shoulder, not specified as traumatic: Secondary | ICD-10-CM | POA: Diagnosis not present

## 2020-02-17 DIAGNOSIS — M25511 Pain in right shoulder: Secondary | ICD-10-CM | POA: Diagnosis not present

## 2020-02-24 DIAGNOSIS — M25511 Pain in right shoulder: Secondary | ICD-10-CM | POA: Diagnosis not present

## 2020-02-24 DIAGNOSIS — M75101 Unspecified rotator cuff tear or rupture of right shoulder, not specified as traumatic: Secondary | ICD-10-CM | POA: Diagnosis not present

## 2020-03-18 DIAGNOSIS — Z09 Encounter for follow-up examination after completed treatment for conditions other than malignant neoplasm: Secondary | ICD-10-CM | POA: Diagnosis not present

## 2020-04-16 IMAGING — MR MR SHOULDER*R* W/O CM
5 series · 40 of 40 positions shown · non-contrast
Comparison: None.

CLINICAL DATA: Right shoulder pain for 6 months. No known injury.

EXAM:
MRI OF THE RIGHT SHOULDER WITHOUT CONTRAST
TECHNIQUE: Multiplanar, multisequence MR imaging of the shoulder was performed.
No intravenous contrast was administered.

[Series 7: PD fat-sat · oblique · right · 4.0mm · 0.62mm/px · 8 of 18 slices shown]
[im 1/18]
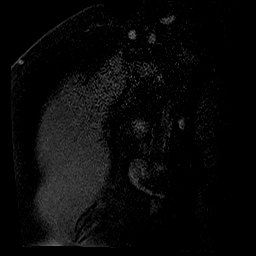
[im 3/18]
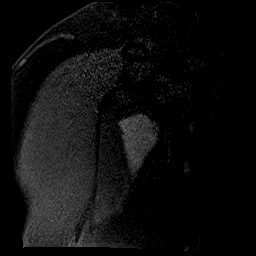
[im 5/18]
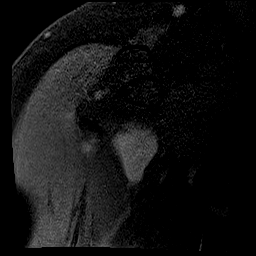
[im 8/18]
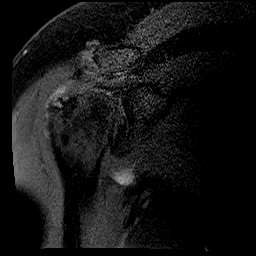
[im 10/18]
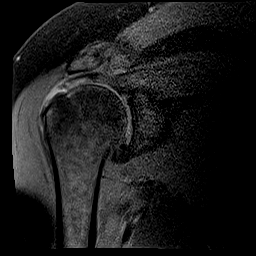
[im 13/18]
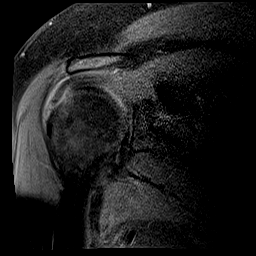
[im 15/18]
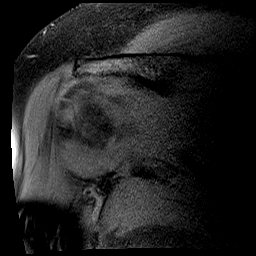
[im 18/18]
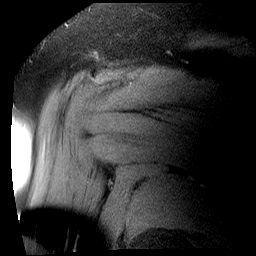

[Series 9: T2 fat-sat · oblique · right · 4.0mm · 0.62mm/px · 8 of 18 slices shown (1 of 3)]
[im 1/18]
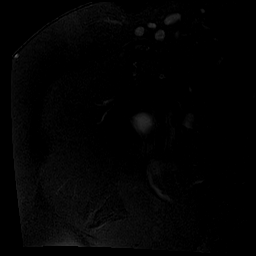
[im 3/18]
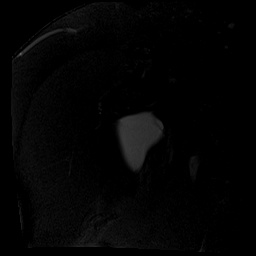
[im 5/18]
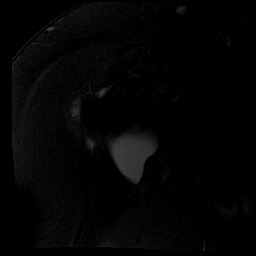
[im 8/18]
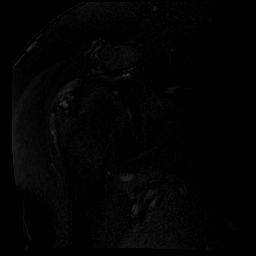
[im 10/18]
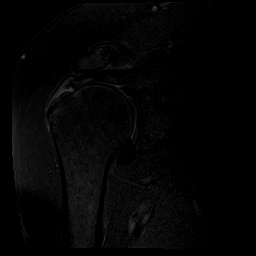
[im 13/18]
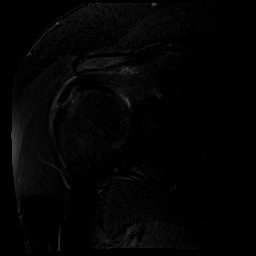
[im 15/18]
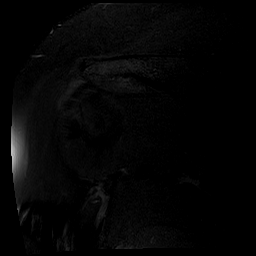
[im 18/18]
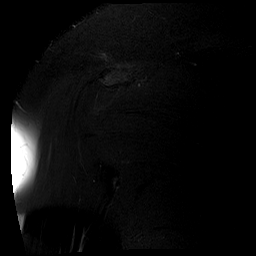

[Series 10: T1 · coronal · right · 4.0mm · 0.62mm/px · 8 of 18 slices shown]
[im 1/18]
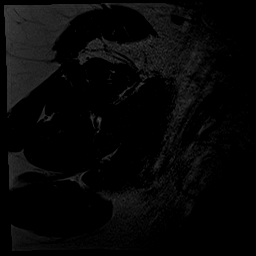
[im 3/18]
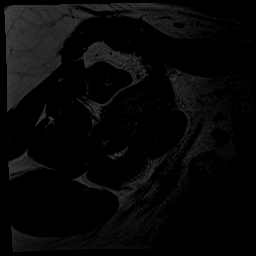
[im 5/18]
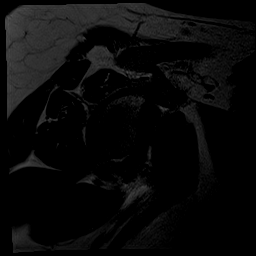
[im 8/18]
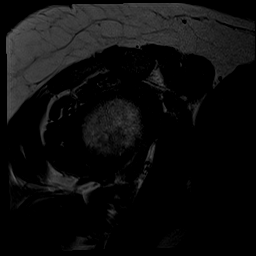
[im 10/18]
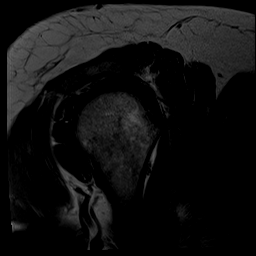
[im 13/18]
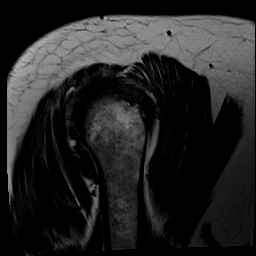
[im 15/18]
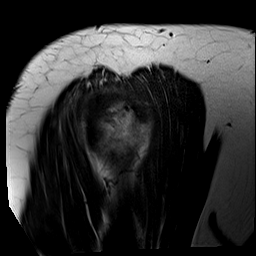
[im 18/18]
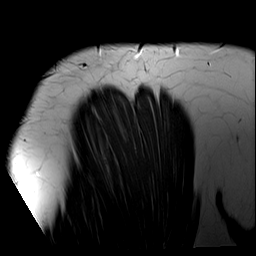

[Series 11: T2 fat-sat · coronal · right · 4.0mm · 0.62mm/px · 8 of 18 slices shown (2 of 3)]
[im 1/18]
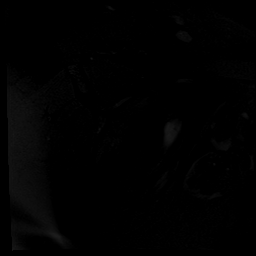
[im 3/18]
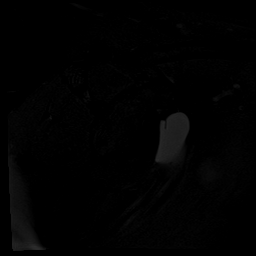
[im 5/18]
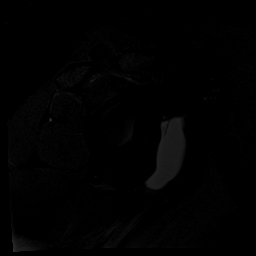
[im 8/18]
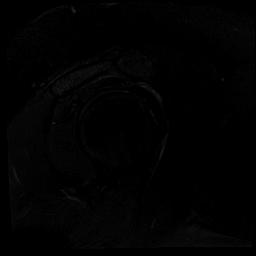
[im 10/18]
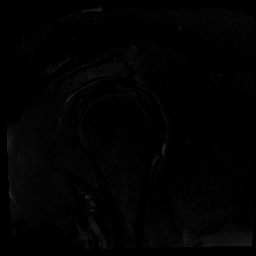
[im 13/18]
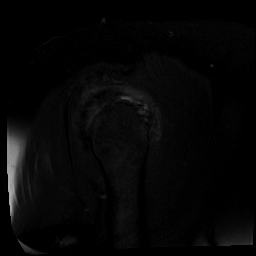
[im 15/18]
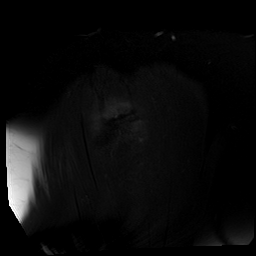
[im 18/18]
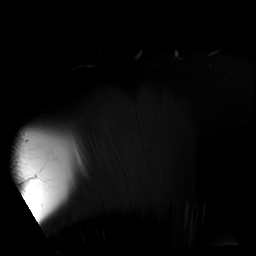

[Series 12: T2 fat-sat · axial · right · 4.0mm · 0.62mm/px · z∈[-16,+63]mm · 8 of 18 slices shown (3 of 3)]
[im 1/18]
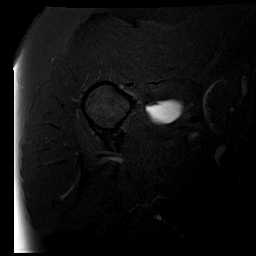
[im 3/18]
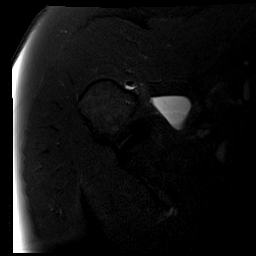
[im 5/18]
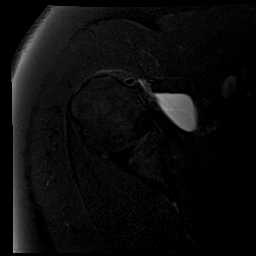
[im 8/18]
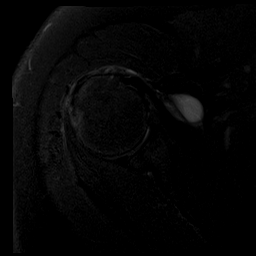
[im 10/18]
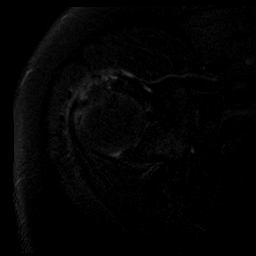
[im 13/18]
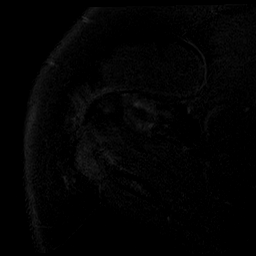
[im 15/18]
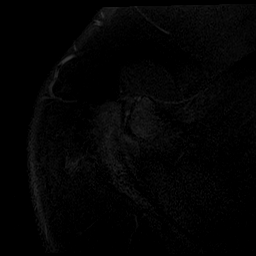
[im 18/18]
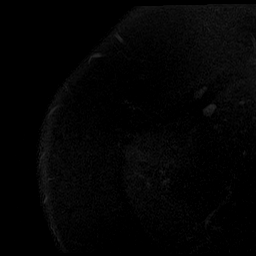

[40 of 40 positions shown; findings below may reference images not displayed]

FINDINGS: Technical note: Examination is mildly degraded by patient motion
artifact. Additionally, a body matrix coil had to be utilized
secondary to habitus.

Rotator cuff: Supraspinatus tendinosis with irregular high-grade
tearing of the majority of the distal tendon including a
full-thickness component (series 9, image 10) with 5-6 mm of
tendinous retraction. Partial-thickness articular surface tears
involving the anterior and mid portions of the infraspinatus tendon
without definite full-thickness component. Partial-thickness tearing
of the superior aspect of the distal subscapularis tendon. Intact
teres minor.

Muscles: No atrophy or abnormal signal of the muscles of the rotator
cuff.

Biceps long head:  Intra-articular biceps tendinosis.

Acromioclavicular Joint: Mild arthropathy of the acromioclavicular
joint. Trace subacromial/subdeltoid bursal fluid.

Glenohumeral Joint: No joint effusion. No discrete chondral defect.

Labrum: Grossly intact, but evaluation is limited by lack of
intraarticular fluid.

Bones:  No marrow abnormality, fracture or dislocation.

Other: Moderate volume fluid within the subcoracoid bursa.
IMPRESSION: 1. Supraspinatus tendinosis with irregular high-grade tearing of the
majority of the distal tendon including a full-thickness component
with minimal tendinous retraction.
2. Partial-thickness tearing of the infraspinatus and subscapularis
tendons.
3. Intra-articular biceps tendinosis.
4. Subcoracoid bursitis.

## 2020-05-27 ENCOUNTER — Other Ambulatory Visit: Payer: Self-pay | Admitting: Emergency Medicine

## 2020-05-27 ENCOUNTER — Other Ambulatory Visit: Payer: Self-pay | Admitting: Physician Assistant

## 2020-05-27 ENCOUNTER — Other Ambulatory Visit: Payer: Self-pay | Admitting: Family Medicine

## 2020-05-27 DIAGNOSIS — K219 Gastro-esophageal reflux disease without esophagitis: Secondary | ICD-10-CM

## 2020-05-27 DIAGNOSIS — I10 Essential (primary) hypertension: Secondary | ICD-10-CM

## 2020-05-27 NOTE — Telephone Encounter (Signed)
Requested medication (s) are due for refill today: no  Requested medication (s) are on the active medication list: yes  Last refill:  11/30/2019  Future visit scheduled: no  Notes to clinic:  Patient due for follow up   Requested Prescriptions  Pending Prescriptions Disp Refills   sertraline (ZOLOFT) 100 MG tablet [Pharmacy Med Name: Sertraline HCl 100 MG Oral Tablet] 90 tablet 0    Sig: Take 1 tablet by mouth once daily      Psychiatry:  Antidepressants - SSRI Failed - 05/27/2020 12:44 PM      Failed - Valid encounter within last 6 months    Recent Outpatient Visits           1 year ago Essential hypertension   [facility], Lilia Argue, MD   2 years ago Onychomycosis   [facility], Damaris Hippo, PA-C   2 years ago Essential hypertension   [facility], Damaris Hippo, PA-C   2 years ago URI with cough and congestion   [facility], Damaris Hippo, PA-C   2 years ago Essential hypertension   [facility], [place], MD                omeprazole (PRILOSEC) 40 MG capsule [Pharmacy Med Name: Omeprazole 40 MG Oral Capsule Delayed Release] 90 capsule 0    Sig: Take 1 capsule by mouth once daily      Gastroenterology: Proton Pump Inhibitors Failed - 05/27/2020 12:44 PM      Failed - Valid encounter within last 12 months    Recent Outpatient Visits           1 year ago Essential hypertension   [facility], Lilia Argue, MD   2 years ago Onychomycosis   [facility], Damaris Hippo, PA-C   2 years ago Essential hypertension   [facility], Damaris Hippo, PA-C   2 years ago URI with cough and congestion   [facility], Damaris Hippo, PA-C   2 years ago Essential hypertension   [facility] at [facility], [place], [place]

## 2020-05-28 ENCOUNTER — Telehealth: Payer: Self-pay | Admitting: Physician Assistant

## 2020-05-28 NOTE — Telephone Encounter (Signed)
Called pt LVMTCB on 05/28/20 @ 1:41 pm

## 2020-05-28 NOTE — Telephone Encounter (Signed)
Patient called needing his Certraline, lisenopril and Ameprozole - [facility], [place] in [place] -   Patient tried to get these filled and was told that he needed to make an appointment.  Made appointment for next Friday - 06/05/2020 -  Please advise

## 2020-05-28 NOTE — Telephone Encounter (Signed)
No further refills without office visit 

## 2020-05-28 NOTE — Telephone Encounter (Signed)
Left a vm message informing the patient that his medications had already been sent to his pharmacy.

## 2020-05-28 NOTE — Telephone Encounter (Signed)
Please schedule appt for f/u and med refills

## 2020-06-05 ENCOUNTER — Ambulatory Visit: Payer: BC Managed Care – PPO | Admitting: Physician Assistant

## 2020-07-03 ENCOUNTER — Encounter: Payer: Self-pay | Admitting: Physician Assistant

## 2020-07-03 ENCOUNTER — Ambulatory Visit (INDEPENDENT_AMBULATORY_CARE_PROVIDER_SITE_OTHER): Payer: Managed Care, Other (non HMO) | Admitting: Physician Assistant

## 2020-07-03 ENCOUNTER — Other Ambulatory Visit: Payer: Self-pay

## 2020-07-03 VITALS — BP 130/86 | HR 82 | Temp 98.6°F | Resp 16 | Ht 74.0 in | Wt 372.0 lb

## 2020-07-03 DIAGNOSIS — Z23 Encounter for immunization: Secondary | ICD-10-CM | POA: Diagnosis not present

## 2020-07-03 DIAGNOSIS — F411 Generalized anxiety disorder: Secondary | ICD-10-CM

## 2020-07-03 DIAGNOSIS — I10 Essential (primary) hypertension: Secondary | ICD-10-CM

## 2020-07-03 DIAGNOSIS — Z1211 Encounter for screening for malignant neoplasm of colon: Secondary | ICD-10-CM

## 2020-07-03 DIAGNOSIS — E119 Type 2 diabetes mellitus without complications: Secondary | ICD-10-CM | POA: Diagnosis not present

## 2020-07-03 NOTE — Progress Notes (Signed)
History of Present Illness: Patient is a 49 y.o. male who presents to clinic today for follow-up of Diabetes Mellitus II, controlled, as well as hypertension and anxiety. In regards to his diabetes, patient is currently on medication regimen of Metformin XR 1000 mg daily. Is also on ACEI.  Endorses taking medications as directed. Endorses cutting sodas out of diet in the past 6 months.  Denies change in vision, neuropathy symptoms. Notes good bowel and bladder habits.. Is keeping very active at work, averaging 2 miles per day.   Latest Maintenance: A1C --  Lab Results  Component Value Date   HGBA1C 6.8 (H) 10/01/2019   Diabetic Eye Exam -- Overdue. Will call to schedule  Urine Microalbumin -- On ACEI Foot Exam -- up-to-date.  In regards to hypertension, patient is taking his Lisinopril-HCTZ 10-12.5 mg tablet daily as directed. Patient denies chest pain, palpitations, lightheadedness, dizziness, vision changes or frequent headaches.  BP Readings from Last 3 Encounters:  11/23/19 139/84  10/03/19 (!) 142/85  10/01/19 (!) 158/104   Patient also on a regimen of Sertraline 100 mg daily. Endorses taking as directed. Notes good relief of anxiety symptoms with this regimen. Is sleeping well.   Past Medical History:  Diagnosis Date  . Anxiety   . Diabetes mellitus without complication ([place])   . GERD (gastroesophageal reflux disease)   . Hypertension   . Sleep apnea     Current Outpatient Medications on File Prior to Visit  Medication Sig Dispense Refill  . lisinopril-hydrochlorothiazide (ZESTORETIC) 10-12.5 MG tablet Take 1 tablet by mouth daily. Please call the office to schedule a physical 90 tablet 0  . metFORMIN (GLUCOPHAGE-XR) 500 MG 24 hr tablet TAKE 2 TABLETS BY MOUTH ONCE DAILY WITH BREAKFAST 180 tablet 0  . omeprazole (PRILOSEC) 40 MG capsule Take 1 capsule by mouth once daily 30 capsule 0  . sertraline (ZOLOFT) 100 MG tablet Take 1 tablet by mouth once daily 30 tablet 0    No current facility-administered medications on file prior to visit.    No Known Allergies  Family History  Problem Relation Age of Onset  . Heart disease Father   . Stroke Father   . Diabetes Father     Social History   Socioeconomic History  . Marital status: Married    Spouse name: Not on file  . Number of children: 1  . Years of education: Not on file  . Highest education level: Not on file  Occupational History  . Not on file  Tobacco Use  . Smoking status: Never Smoker  . Smokeless tobacco: Never Used  Vaping Use  . Vaping Use: Never used  Substance and Sexual Activity  . Alcohol use: No  . Drug use: No  . Sexual activity: Yes  Other Topics Concern  . Not on file  Social History Narrative  . Not on file   Social Determinants of Health   Financial Resource Strain:   . Difficulty of Paying Living Expenses: Not on file  Food Insecurity:   . Worried About Charity fundraiser in the Last Year: Not on file  . Ran Out of Food in the Last Year: Not on file  Transportation Needs:   . Lack of Transportation (Medical): Not on file  . Lack of Transportation (Non-Medical): Not on file  Physical Activity:   . Days of Exercise per Week: Not on file  . Minutes of Exercise per Session: Not on file  Stress:   . Feeling of  Stress : Not on file  Social Connections:   . Frequency of Communication with Friends and Family: Not on file  . Frequency of Social Gatherings with Friends and Family: Not on file  . Attends Religious Services: Not on file  . Active Member of Clubs or Organizations: Not on file  . Attends Archivist Meetings: Not on file  . Marital Status: Not on file   Review of Systems: Pertinent ROS are listed in HPI  Physical Examination: BP 130/86   Pulse 82   Temp 98.6 F (37 C) (Temporal)   Resp 16   Ht _0  (1.88 m)   Wt (!) 372 lb (168.7 kg)   SpO2 97%   BMI 47.76 kg/m  General appearance: alert, appears stated age and no  distress Lungs: clear to auscultation bilaterally Heart: regular rate and rhythm, S1, S2 normal, no murmur, click, rub or gallop Extremities: extremities normal, atraumatic, no cyanosis or edema Pulses: 2+ and symmetric Lymph nodes: Cervical, supraclavicular, and axillary nodes normal.   Assessment/Plan: 1. New onset type 2 diabetes mellitus ([place]) Patient taking Metformin as directed. Tolerating well. Is down 8 pounds. Foot exam updated. Is on ACE inhibitor. Discussed that medicine. He will give some thought to this. We'll recheck CMP and A1c levels today. Will alter regimen according to results. Dietary and exercise recommendations again reviewed with patient. - Comp Met (CMET) - Hemoglobin A1c  2. Essential hypertension BP normotensive. Asymptomatic. Continue current regimen.  3. Obesity, Class III, BMI 40-49.9 (morbid obesity) (HCC) Dietary and exercise recommendations again reviewed with patient. Repeat labs today.  4. Anxiety state Doing very well. Continue current regimen.  5. Colon cancer screening Patient is average risk and asymptomatic. After discussion of screening options he would like to proceed with Cologuard. Order placed. - Cologuard  This visit occurred during the SARS-CoV-2 public health emergency.  Safety protocols were in place, including screening questions prior to the visit, additional usage of staff PPE, and extensive cleaning of exam room while observing appropriate contact time as indicated for disinfecting solutions.

## 2020-07-03 NOTE — Patient Instructions (Signed)
Please go to the lab today for blood work.  I will call you with your results. We will alter treatment regimen(s) if indicated by your results.   Please continue current medication regimen. Keep working on diet.  Keep active (goal of at least 150 minutes per week of aerobic exercise). Congratulations on the weight loss thus far.  Your flu shot was updated today. You will be contacted regarding your cologuard test.    Diabetes Mellitus and Exercise Exercising regularly is important for your overall health, especially when you have diabetes (diabetes mellitus). Exercising is not only about losing weight. It has many other health benefits, such as increasing muscle strength and bone density and reducing body fat and stress. This leads to improved fitness, flexibility, and endurance, all of which result in better overall health. Exercise has additional benefits for people with diabetes, including:  Reducing appetite.  Helping to lower and control blood glucose.  Lowering blood pressure.  Helping to control amounts of fatty substances (lipids) in the blood, such as cholesterol and triglycerides.  Helping the body to respond better to insulin (improving insulin sensitivity).  Reducing how much insulin the body needs.  Decreasing the risk for heart disease by: ? Lowering cholesterol and triglyceride levels. ? Increasing the levels of good cholesterol. ? Lowering blood glucose levels. What is my activity plan? Your health care provider or certified diabetes educator can help you make a plan for the type and frequency of exercise (activity plan) that works for you. Make sure that you:  Do at least 150 minutes of moderate-intensity or vigorous-intensity exercise each week. This could be brisk walking, biking, or water aerobics. ? Do stretching and strength exercises, such as yoga or weightlifting, at least 2 times a week. ? Spread out your activity over at least 3 days of the week.  Get  some form of physical activity every day. ? Do not go more than 2 days in a row without some kind of physical activity. ? Avoid being inactive for more than 30 minutes at a time. Take frequent breaks to walk or stretch.  Choose a type of exercise or activity that you enjoy, and set realistic goals.  Start slowly, and gradually increase the intensity of your exercise over time. What do I need to know about managing my diabetes?   Check your blood glucose before and after exercising. ? If your blood glucose is 240 mg/dL (13.3 mmol/L) or higher before you exercise, check your urine for ketones. If you have ketones in your urine, do not exercise until your blood glucose returns to normal. ? If your blood glucose is 100 mg/dL (5.6 mmol/L) or lower, eat a snack containing 15-20 grams of carbohydrate. Check your blood glucose 15 minutes after the snack to make sure that your level is above 100 mg/dL (5.6 mmol/L) before you start your exercise.  Know the symptoms of low blood glucose (hypoglycemia) and how to treat it. Your risk for hypoglycemia increases during and after exercise. Common symptoms of hypoglycemia can include: ? Hunger. ? Anxiety. ? Sweating and feeling clammy. ? Confusion. ? Dizziness or feeling light-headed. ? Increased heart rate or palpitations. ? Blurry vision. ? Tingling or numbness around the mouth, lips, or tongue. ? Tremors or shakes. ? Irritability.  Keep a rapid-acting carbohydrate snack available before, during, and after exercise to help prevent or treat hypoglycemia.  Avoid injecting insulin into areas of the body that are going to be exercised. For example, avoid injecting insulin into: ?  The arms, when playing tennis. ? The legs, when jogging.  Keep records of your exercise habits. Doing this can help you and your health care provider adjust your diabetes management plan as needed. Write down: ? Food that you eat before and after you exercise. ? Blood glucose  levels before and after you exercise. ? The type and amount of exercise you have done. ? When your insulin is expected to peak, if you use insulin. Avoid exercising at times when your insulin is peaking.  When you start a new exercise or activity, work with your health care provider to make sure the activity is safe for you, and to adjust your insulin, medicines, or food intake as needed.  Drink plenty of water while you exercise to prevent dehydration or heat stroke. Drink enough fluid to keep your urine clear or pale yellow. Summary  Exercising regularly is important for your overall health, especially when you have diabetes (diabetes mellitus).  Exercising has many health benefits, such as increasing muscle strength and bone density and reducing body fat and stress.  Your health care provider or certified diabetes educator can help you make a plan for the type and frequency of exercise (activity plan) that works for you.  When you start a new exercise or activity, work with your health care provider to make sure the activity is safe for you, and to adjust your insulin, medicines, or food intake as needed. This information is not intended to replace advice given to you by your health care provider. Make sure you discuss any questions you have with your health care provider. Document Revised: 05/11/2017 Document Reviewed: 03/28/2016 Elsevier Patient Education  [place].

## 2020-07-04 LAB — COMPREHENSIVE METABOLIC PANEL
AG Ratio: 1.5 (calc) (ref 1.0–2.5)
ALT: 43 U/L (ref 9–46)
AST: 28 U/L (ref 10–35)
Albumin: 4.1 g/dL (ref 3.6–5.1)
Alkaline phosphatase (APISO): 102 U/L (ref 35–144)
BUN: 14 mg/dL (ref 7–25)
CO2: 21 mmol/L (ref 20–32)
Calcium: 9.8 mg/dL (ref 8.6–10.3)
Chloride: 108 mmol/L (ref 98–110)
Creat: 0.92 mg/dL (ref 0.70–1.33)
Globulin: 2.8 g/dL (calc) (ref 1.9–3.7)
Glucose, Bld: 129 mg/dL — ABNORMAL HIGH (ref 65–99)
Potassium: 4.2 mmol/L (ref 3.5–5.3)
Sodium: 140 mmol/L (ref 135–146)
Total Bilirubin: 0.3 mg/dL (ref 0.2–1.2)
Total Protein: 6.9 g/dL (ref 6.1–8.1)

## 2020-07-04 LAB — HEMOGLOBIN A1C
Hgb A1c MFr Bld: 7.1 % of total Hgb — ABNORMAL HIGH (ref <5.7)
Mean Plasma Glucose: 157 (calc)
eAG (mmol/L): 8.7 (calc)

## 2020-07-07 ENCOUNTER — Other Ambulatory Visit: Payer: Self-pay

## 2020-07-07 DIAGNOSIS — E785 Hyperlipidemia, unspecified: Secondary | ICD-10-CM

## 2020-07-07 DIAGNOSIS — E119 Type 2 diabetes mellitus without complications: Secondary | ICD-10-CM

## 2020-07-07 MED ORDER — ROSUVASTATIN CALCIUM 10 MG PO TABS: 10.0000 mg | ORAL_TABLET | Freq: Every day | ORAL | 2 refills | Status: DC

## 2020-07-15 IMAGING — CT CT ABD-PELV W/ CM
2 of 3 series · 13 of 36 positions shown, 18 images · IV contrast (Omnipaque)
Comparison: None.

CLINICAL DATA: Diverticulitis, lower abdominal pain for 1 week

EXAM:
CT ABDOMEN AND PELVIS WITH CONTRAST
TECHNIQUE: Multidetector CT imaging of the abdomen and pelvis was performed
using the standard protocol following bolus administration of
intravenous contrast.
CONTRAST:  100mL OMNIPAQUE IOHEXOL 300 MG/ML  SOLN

[Series 2: axial st · axial · 0.98mm/px · z∈[-470,-5]mm · 12 of 105 slices shown, 16 images]
[im 8/105  soft-tissue]
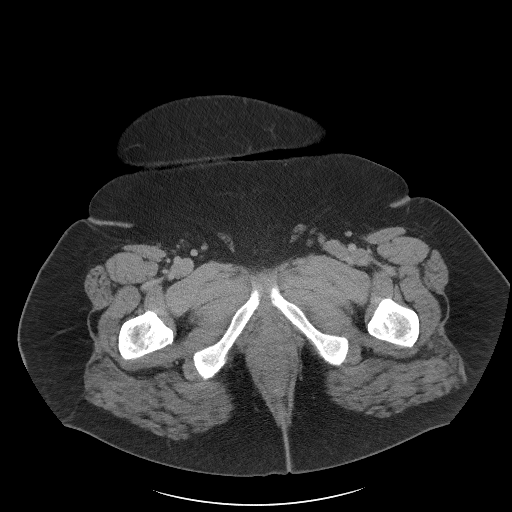
[im 8/105  bone]
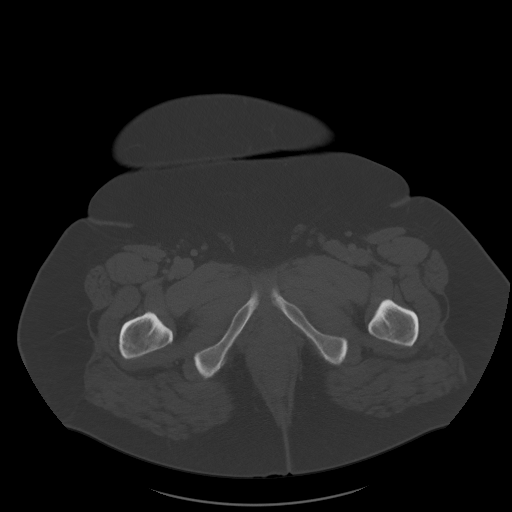
[im 16/105  soft-tissue]
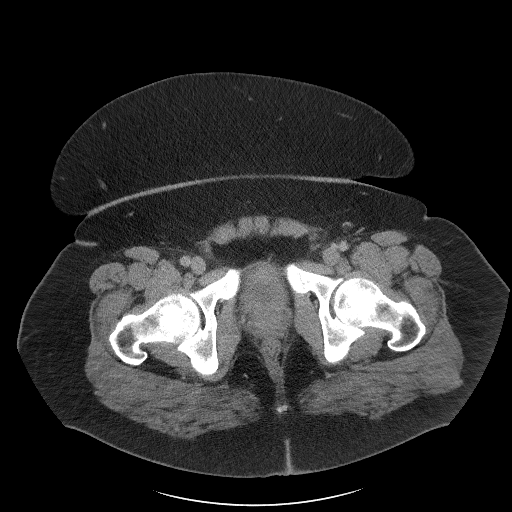
[im 27/105  soft-tissue]
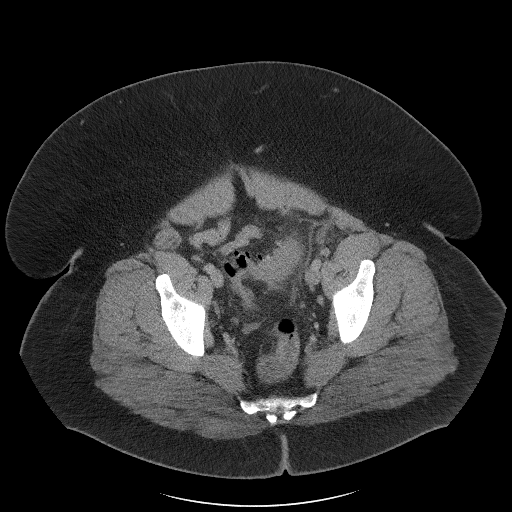
[im 39/105  soft-tissue]
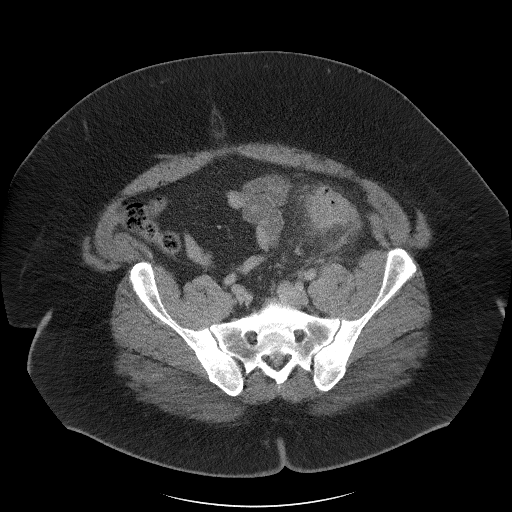
[im 47/105  soft-tissue]
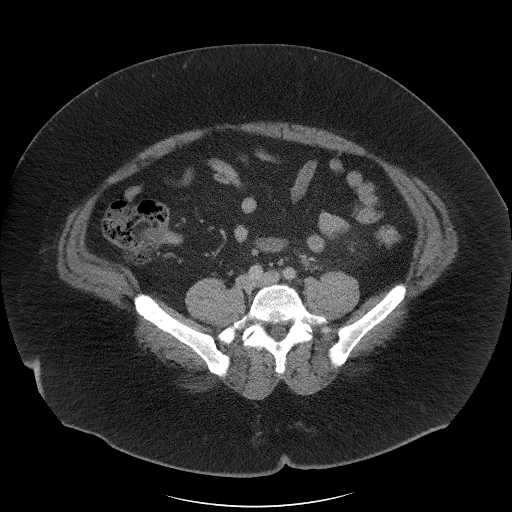
[im 58/105  soft-tissue]
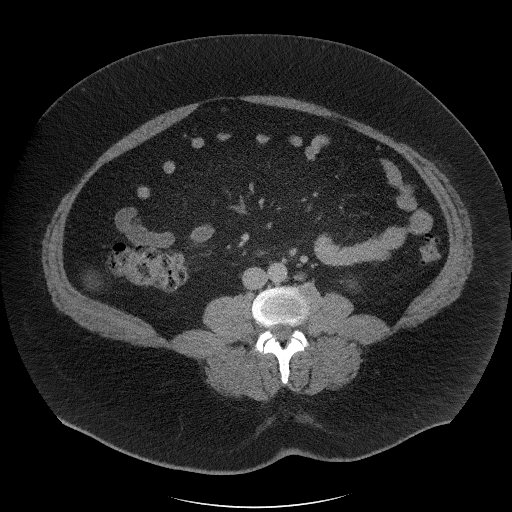
[im 66/105  soft-tissue]
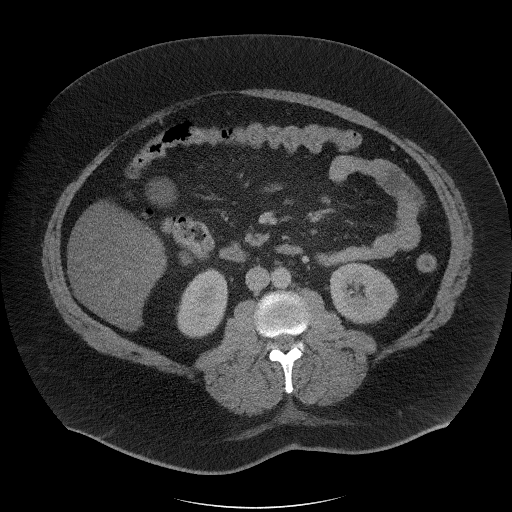
[im 78/105  soft-tissue]
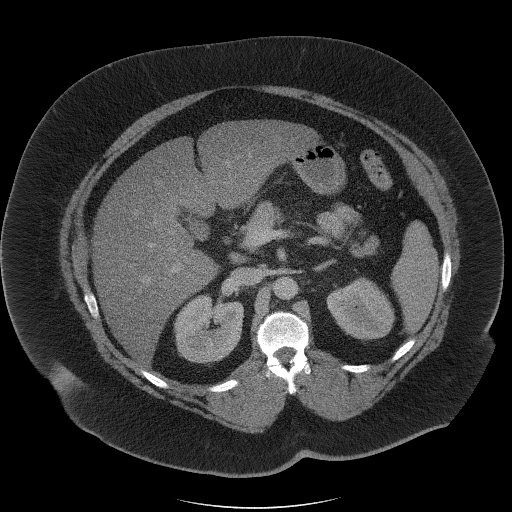
[im 89/105  soft-tissue]
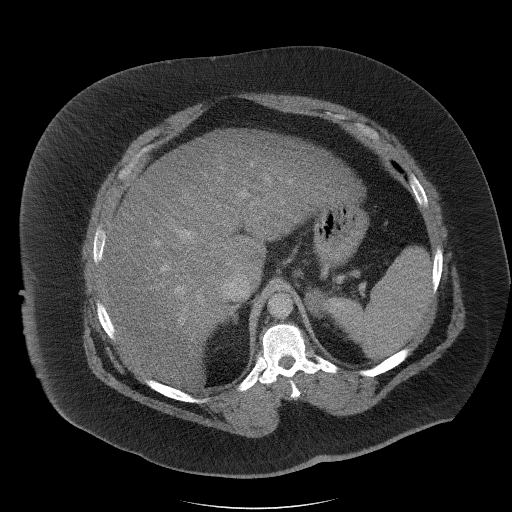
[im 89/105  lung]
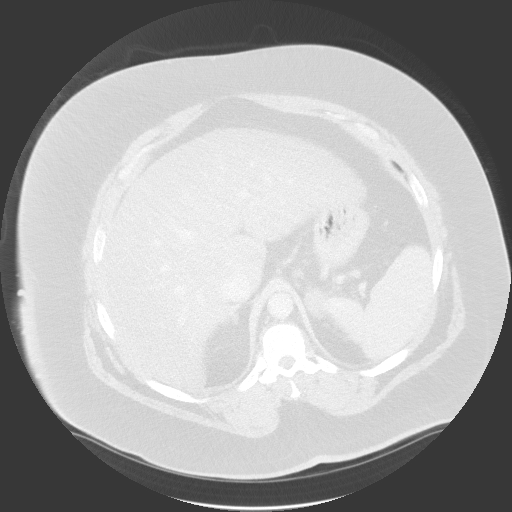
[im 89/105  bone]
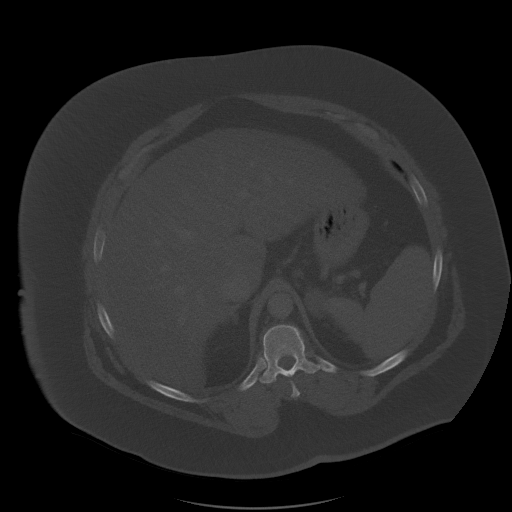
[im 93/105  lung]
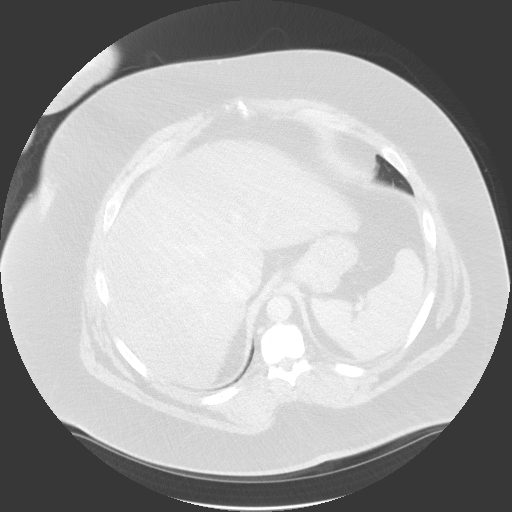
[im 97/105  soft-tissue]
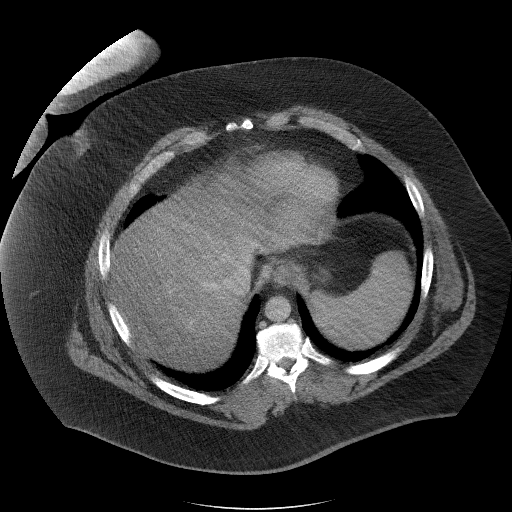
[im 97/105  lung]
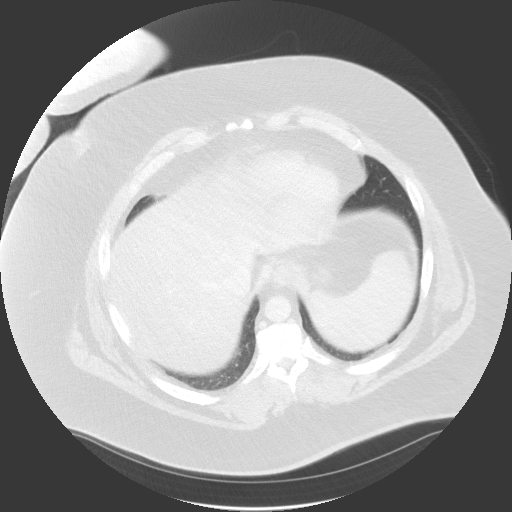
[im 101/105  lung]
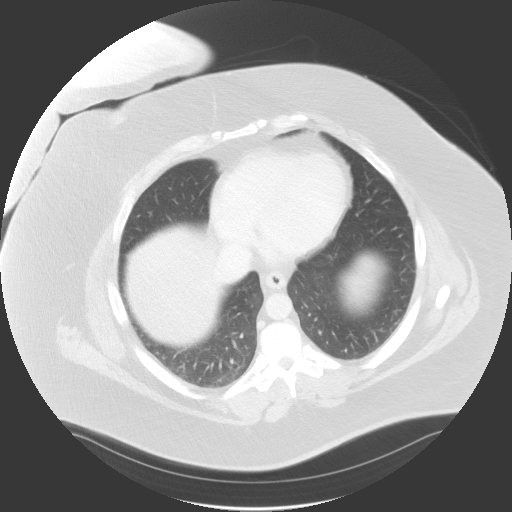

[Series 6: sagittal st · sagittal · 0.96mm/px · 1 of 163 slices shown, 2 images]
[im 55/163  soft-tissue]
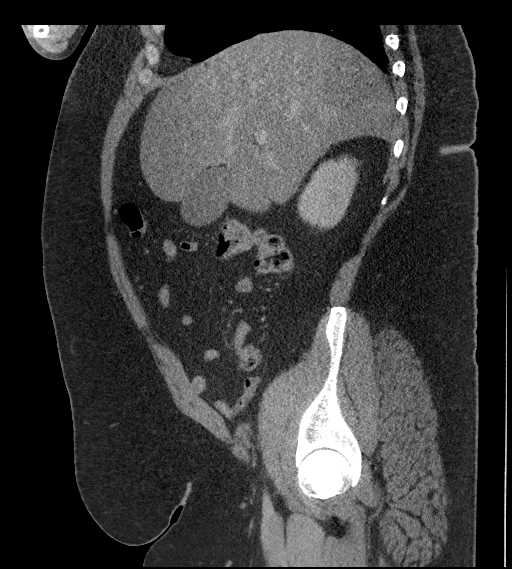
[im 55/163  bone]
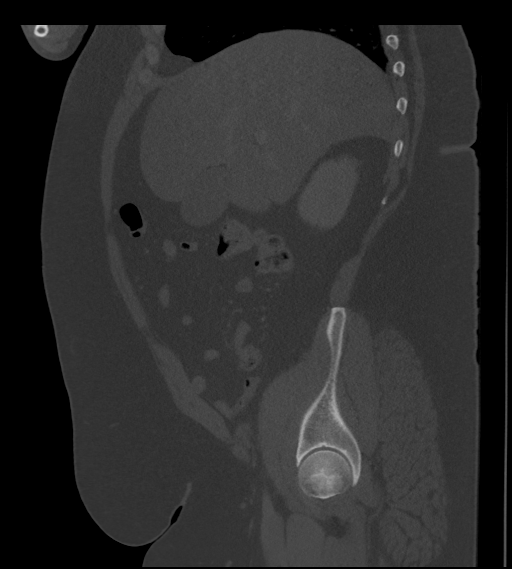

[13 of 36 positions shown; findings below may reference images not displayed]

FINDINGS: Lower chest: No acute abnormality.

Hepatobiliary: Diffuse low attenuation of liver as can be seen with
hepatic steatosis. No focal hepatic mass. Normal gallbladder. No
intrahepatic or extrahepatic biliary ductal dilatation.

Pancreas: Unremarkable. No pancreatic ductal dilatation or
surrounding inflammatory changes.

Spleen: Normal in size without focal abnormality.

Adrenals/Urinary Tract: 3.5 cm indeterminate left adrenal mass.
Normal right adrenal gland. Normal kidneys. No urolithiasis. No
obstructive uropathy. Normal bladder.

Stomach/Bowel: No bowel dilatation to suggest bowel obstruction.
Normal stomach. Normal appendix. Diverticulosis of the descending
and sigmoid colon. Severe bowel wall thickening of the proximal
sigmoid colon with surrounding inflammatory changes most consistent
with acute diverticulitis. No focal fluid collection to suggest an
abscess. Small reactive mesenteric lymph nodes adjacent to the
inflamed segment of the sigmoid colon.

Vascular/Lymphatic: No significant vascular findings are present. No
enlarged abdominal or pelvic lymph nodes.

Reproductive: Prostate is unremarkable.

Other: No abdominal wall hernia or abnormality. No abdominopelvic
ascites.

Musculoskeletal: No acute osseous abnormality. No aggressive osseous
lesion.
IMPRESSION: 1. Acute sigmoid colon diverticulitis. No focal fluid collection to
suggest an abscess.
2. Hepatic steatosis.
3. Indeterminate 3.5 cm left adrenal mass. Recommend further
characterization with a non emergent MRI of the abdomen.

## 2020-10-06 ENCOUNTER — Other Ambulatory Visit: Payer: Self-pay | Admitting: Physician Assistant

## 2020-10-06 DIAGNOSIS — I10 Essential (primary) hypertension: Secondary | ICD-10-CM

## 2020-10-09 ENCOUNTER — Ambulatory Visit: Payer: Managed Care, Other (non HMO) | Admitting: Physician Assistant

## 2020-10-09 ENCOUNTER — Other Ambulatory Visit: Payer: Self-pay

## 2020-10-09 ENCOUNTER — Encounter: Payer: Self-pay | Admitting: Physician Assistant

## 2020-10-09 DIAGNOSIS — Z1159 Encounter for screening for other viral diseases: Secondary | ICD-10-CM

## 2020-10-09 DIAGNOSIS — I152 Hypertension secondary to endocrine disorders: Secondary | ICD-10-CM | POA: Diagnosis not present

## 2020-10-09 DIAGNOSIS — E1159 Type 2 diabetes mellitus with other circulatory complications: Secondary | ICD-10-CM | POA: Diagnosis not present

## 2020-10-09 DIAGNOSIS — E785 Hyperlipidemia, unspecified: Secondary | ICD-10-CM | POA: Diagnosis not present

## 2020-10-09 DIAGNOSIS — E1169 Type 2 diabetes mellitus with other specified complication: Secondary | ICD-10-CM

## 2020-10-09 DIAGNOSIS — E119 Type 2 diabetes mellitus without complications: Secondary | ICD-10-CM

## 2020-10-09 DIAGNOSIS — Z23 Encounter for immunization: Secondary | ICD-10-CM | POA: Diagnosis not present

## 2020-10-09 DIAGNOSIS — R7303 Prediabetes: Secondary | ICD-10-CM

## 2020-10-09 DIAGNOSIS — I10 Essential (primary) hypertension: Secondary | ICD-10-CM

## 2020-10-09 DIAGNOSIS — F411 Generalized anxiety disorder: Secondary | ICD-10-CM

## 2020-10-09 LAB — LIPID PANEL
Cholesterol: 178 mg/dL (ref 0–200)
HDL: 34.2 mg/dL — ABNORMAL LOW (ref >39.00)
NonHDL: 143.33
Total CHOL/HDL Ratio: 5
Triglycerides: 219 mg/dL — ABNORMAL HIGH (ref 0.0–149.0)
VLDL: 43.8 mg/dL — ABNORMAL HIGH (ref 0.0–40.0)

## 2020-10-09 LAB — COMPREHENSIVE METABOLIC PANEL
ALT: 42 U/L (ref 0–53)
AST: 22 U/L (ref 0–37)
Albumin: 4.2 g/dL (ref 3.5–5.2)
Alkaline Phosphatase: 108 U/L (ref 39–117)
BUN: 13 mg/dL (ref 6–23)
CO2: 28 mEq/L (ref 19–32)
Calcium: 9.7 mg/dL (ref 8.4–10.5)
Chloride: 99 mEq/L (ref 96–112)
Creatinine, Ser: 0.82 mg/dL (ref 0.40–1.50)
GFR: 102.55 mL/min (ref >60.00)
Glucose, Bld: 128 mg/dL — ABNORMAL HIGH (ref 70–99)
Potassium: 4.3 mEq/L (ref 3.5–5.1)
Sodium: 135 mEq/L (ref 135–145)
Total Bilirubin: 0.6 mg/dL (ref 0.2–1.2)
Total Protein: 7.3 g/dL (ref 6.0–8.3)

## 2020-10-09 LAB — HEMOGLOBIN A1C: Hgb A1c MFr Bld: 7.6 % — ABNORMAL HIGH (ref 4.6–6.5)

## 2020-10-09 LAB — LDL CHOLESTEROL, DIRECT: Direct LDL: 110 mg/dL

## 2020-10-09 MED ORDER — METFORMIN HCL ER 500 MG PO TB24: ORAL_TABLET | ORAL | 1 refills | Status: DC

## 2020-10-09 MED ORDER — SERTRALINE HCL 100 MG PO TABS: 100.0000 mg | ORAL_TABLET | Freq: Every day | ORAL | 1 refills | Status: DC

## 2020-10-09 MED ORDER — LISINOPRIL-HYDROCHLOROTHIAZIDE 10-12.5 MG PO TABS
1.0000 | ORAL_TABLET | Freq: Every day | ORAL | 1 refills | Status: DC
Start: 2020-10-09 — End: 2021-06-16

## 2020-10-09 NOTE — Patient Instructions (Addendum)
Please go to the lab today for blood work.  I will call you with your results. We will alter treatment regimen(s) if indicated by your results.   Your TDaP was updated today.  Please schedule an eye examination. This is very important.   Please work on a goal of 150 minutes of aerobic exercise outside of work. Try to avoid eating out as much.  Give some thought to letting me set you up with our [facility].    Diabetes Mellitus and Exercise Exercising regularly is important for your overall health, especially when you have diabetes (diabetes mellitus). Exercising is not only about losing weight. It has many other health benefits, such as increasing muscle strength and bone density and reducing body fat and stress. This leads to improved fitness, flexibility, and endurance, all of which result in better overall health. Exercise has additional benefits for people with diabetes, including:  Reducing appetite.  Helping to lower and control blood glucose.  Lowering blood pressure.  Helping to control amounts of fatty substances (lipids) in the blood, such as cholesterol and triglycerides.  Helping the body to respond better to insulin (improving insulin sensitivity).  Reducing how much insulin the body needs.  Decreasing the risk for heart disease by: ? Lowering cholesterol and triglyceride levels. ? Increasing the levels of good cholesterol. ? Lowering blood glucose levels. What is my activity plan? Your health care provider or certified diabetes educator can help you make a plan for the type and frequency of exercise (activity plan) that works for you. Make sure that you:  Do at least 150 minutes of moderate-intensity or vigorous-intensity exercise each week. This could be brisk walking, biking, or water aerobics. ? Do stretching and strength exercises, such as yoga or weightlifting, at least 2 times a week. ? Spread out your activity over at least 3 days of  the week.  Get some form of physical activity every day. ? Do not go more than 2 days in a row without some kind of physical activity. ? Avoid being inactive for more than 30 minutes at a time. Take frequent breaks to walk or stretch.  Choose a type of exercise or activity that you enjoy, and set realistic goals.  Start slowly, and gradually increase the intensity of your exercise over time. What do I need to know about managing my diabetes?   Check your blood glucose before and after exercising. ? If your blood glucose is 240 mg/dL (13.3 mmol/L) or higher before you exercise, check your urine for ketones. If you have ketones in your urine, do not exercise until your blood glucose returns to normal. ? If your blood glucose is 100 mg/dL (5.6 mmol/L) or lower, eat a snack containing 15-20 grams of carbohydrate. Check your blood glucose 15 minutes after the snack to make sure that your level is above 100 mg/dL (5.6 mmol/L) before you start your exercise.  Know the symptoms of low blood glucose (hypoglycemia) and how to treat it. Your risk for hypoglycemia increases during and after exercise. Common symptoms of hypoglycemia can include: ? Hunger. ? Anxiety. ? Sweating and feeling clammy. ? Confusion. ? Dizziness or feeling light-headed. ? Increased heart rate or palpitations. ? Blurry vision. ? Tingling or numbness around the mouth, lips, or tongue. ? Tremors or shakes. ? Irritability.  Keep a rapid-acting carbohydrate snack available before, during, and after exercise to help prevent or treat hypoglycemia.  Avoid injecting insulin into areas of the body that are  going to be exercised. For example, avoid injecting insulin into: ? The arms, when playing tennis. ? The legs, when jogging.  Keep records of your exercise habits. Doing this can help you and your health care provider adjust your diabetes management plan as needed. Write down: ? Food that you eat before and after you  exercise. ? Blood glucose levels before and after you exercise. ? The type and amount of exercise you have done. ? When your insulin is expected to peak, if you use insulin. Avoid exercising at times when your insulin is peaking.  When you start a new exercise or activity, work with your health care provider to make sure the activity is safe for you, and to adjust your insulin, medicines, or food intake as needed.  Drink plenty of water while you exercise to prevent dehydration or heat stroke. Drink enough fluid to keep your urine clear or pale yellow. Summary  Exercising regularly is important for your overall health, especially when you have diabetes (diabetes mellitus).  Exercising has many health benefits, such as increasing muscle strength and bone density and reducing body fat and stress.  Your health care provider or certified diabetes educator can help you make a plan for the type and frequency of exercise (activity plan) that works for you.  When you start a new exercise or activity, work with your health care provider to make sure the activity is safe for you, and to adjust your insulin, medicines, or food intake as needed. This information is not intended to replace advice given to you by your health care provider. Make sure you discuss any questions you have with your health care provider. Document Revised: 05/11/2017 Document Reviewed: 03/28/2016 Elsevier Patient Education  [place].

## 2020-10-09 NOTE — Progress Notes (Signed)
Patient presents to clinic today for follow-up of hypertension and DM II without complication. Patient is currently on a regimen of lisinopril-HCTZ 10-12.5 mg daily, Metformin XR 1000 mg daily and rosuvastatin 10 mg daily. Endorses taking medications daily as directed. Tolerating well without any noted side effect. Is trying to watch what he eats but lunch is usually eating out due to his job. Is trying to stay well-hydrated. Notes staying active at work but no outside exercise regimen.  Patient denies chest pain, palpitations, lightheadedness, dizziness, vision changes or frequent headaches.  BP Readings from Last 3 Encounters:  10/09/20 130/80  07/03/20 130/86  11/23/19 139/84   Patient is also needing refill of Sertraline that he has been on for several years for mood. Notes mood is stable on medication. Denies breakthrough symptoms. Notes good rest at night.   Past Medical History:  Diagnosis Date  . Anxiety   . Diabetes mellitus without complication ([place])   . GERD (gastroesophageal reflux disease)   . Hypertension   . Sleep apnea     Current Outpatient Medications on File Prior to Visit  Medication Sig Dispense Refill  . lisinopril-hydrochlorothiazide (ZESTORETIC) 10-12.5 MG tablet Take 1 tablet by mouth daily. Please call the office to schedule a physical 90 tablet 0  . metFORMIN (GLUCOPHAGE-XR) 500 MG 24 hr tablet TAKE 2 TABLETS BY MOUTH ONCE DAILY WITH BREAKFAST 180 tablet 0  . omeprazole (PRILOSEC) 40 MG capsule Take 1 capsule by mouth once daily 30 capsule 0  . rosuvastatin (CRESTOR) 10 MG tablet Take 1 tablet (10 mg total) by mouth daily. 30 tablet 2  . sertraline (ZOLOFT) 100 MG tablet Take 1 tablet by mouth once daily 30 tablet 0   No current facility-administered medications on file prior to visit.    No Known Allergies  Family History  Problem Relation Age of Onset  . Heart disease Father   . Stroke Father   . Diabetes Father     Social History    Socioeconomic History  . Marital status: Married    Spouse name: Not on file  . Number of children: 1  . Years of education: Not on file  . Highest education level: Not on file  Occupational History  . Not on file  Tobacco Use  . Smoking status: Never Smoker  . Smokeless tobacco: Never Used  Vaping Use  . Vaping Use: Never used  Substance and Sexual Activity  . Alcohol use: No  . Drug use: No  . Sexual activity: Yes  Other Topics Concern  . Not on file  Social History Narrative  . Not on file   Social Determinants of Health   Financial Resource Strain: Not on file  Food Insecurity: Not on file  Transportation Needs: Not on file  Physical Activity: Not on file  Stress: Not on file  Social Connections: Not on file   Review of Systems - See HPI.  All other ROS are negative.  BP 130/80   Pulse 95   Temp 98.3 F (36.8 C) (Temporal)   Resp 20   Ht 6\' 2"  (1.88 m)   Wt (!) 368 lb (166.9 kg)   SpO2 95%   BMI 47.25 kg/m   Physical Exam Vitals reviewed.  Constitutional:      Appearance: Normal appearance. He is obese.  HENT:     Head: Normocephalic and atraumatic.  Cardiovascular:     Rate and Rhythm: Normal rate and regular rhythm.     Heart sounds: Normal  heart sounds.  Pulmonary:     Effort: Pulmonary effort is normal.     Breath sounds: Normal breath sounds.  Musculoskeletal:     Cervical back: Neck supple.  Neurological:     General: No focal deficit present.     Mental Status: He is alert. Mental status is at baseline.  Psychiatric:        Mood and Affect: Mood normal.        Behavior: Behavior normal.    Assessment/Plan: 1. New onset type 2 diabetes mellitus ([place]) Taking medications as directed. Foot exam up-to-date. No known complications. Patient to schedule eye examination. Repeat fasting labs today. Will adjust regimen accordingly.  - Comprehensive metabolic panel - Hemoglobin A1c - Lipid panel  2. Hyperlipidemia associated with type 2  diabetes mellitus ([place]) Taking rosuvastatin as directed. Dietary and exercise recommendations reviewed with patient . Repeat labs today.  - Comprehensive metabolic panel - Hemoglobin A1c - Lipid panel  3. Hypertension associated with diabetes ([place]) BP normotensive. Asymptomatic. Continue current regimen. Repeat labs today.  - Comprehensive metabolic panel - Lipid panel  4. Morbid obesity due to excess calories ([place]) Labs today to further risk stratify. BP at goal. Working on glucose and cholesterol. Recommended [facility] weight and wellness as well. He will give thought to this.  - Comprehensive metabolic panel - Hemoglobin A1c - Lipid panel  5. Anxiety state Stable. Medications refilled.   6. Need for hepatitis C screening test Due per CDC guidelines. Obtained today with routine labs.  - Hepatitis C Antibody  This visit occurred during the SARS-CoV-2 public health emergency.  Safety protocols were in place, including screening questions prior to the visit, additional usage of staff PPE, and extensive cleaning of exam room while observing appropriate contact time as indicated for disinfecting solutions.     Leeanne Rio, PA-C

## 2020-10-12 LAB — HEPATITIS C ANTIBODY
Hepatitis C Ab: NONREACTIVE
SIGNAL TO CUT-OFF: 0.02 (ref <1.00)

## 2020-10-14 ENCOUNTER — Telehealth: Payer: Self-pay | Admitting: Physician Assistant

## 2020-10-14 DIAGNOSIS — K219 Gastro-esophageal reflux disease without esophagitis: Secondary | ICD-10-CM

## 2020-10-14 MED ORDER — OMEPRAZOLE 40 MG PO CPDR: 40.0000 mg | DELAYED_RELEASE_CAPSULE | Freq: Every day | ORAL | 1 refills | Status: DC

## 2020-10-14 NOTE — Telephone Encounter (Signed)
Rx sent to the preferred patient pharmacy  

## 2020-10-14 NOTE — Telephone Encounter (Signed)
Pt called in asking for a refill of the omeprazole be sent to the [facility] in [place]

## 2020-10-15 ENCOUNTER — Other Ambulatory Visit: Payer: Self-pay

## 2020-10-15 DIAGNOSIS — R7303 Prediabetes: Secondary | ICD-10-CM

## 2020-10-15 MED ORDER — ROSUVASTATIN CALCIUM 20 MG PO TABS: 20.0000 mg | ORAL_TABLET | Freq: Every day | ORAL | 0 refills | Status: DC

## 2020-10-15 MED ORDER — METFORMIN HCL ER 500 MG PO TB24: ORAL_TABLET | ORAL | 0 refills | Status: DC

## 2021-01-13 ENCOUNTER — Ambulatory Visit: Payer: Managed Care, Other (non HMO) | Admitting: Physician Assistant

## 2021-06-16 ENCOUNTER — Other Ambulatory Visit: Payer: Self-pay

## 2021-06-16 ENCOUNTER — Telehealth: Payer: Self-pay

## 2021-06-16 DIAGNOSIS — K219 Gastro-esophageal reflux disease without esophagitis: Secondary | ICD-10-CM

## 2021-06-16 DIAGNOSIS — I10 Essential (primary) hypertension: Secondary | ICD-10-CM

## 2021-06-16 MED ORDER — LISINOPRIL-HYDROCHLOROTHIAZIDE 10-12.5 MG PO TABS: 1.0000 | ORAL_TABLET | Freq: Every day | ORAL | 0 refills | Status: DC

## 2021-06-16 MED ORDER — SERTRALINE HCL 100 MG PO TABS: 100.0000 mg | ORAL_TABLET | Freq: Every day | ORAL | 0 refills | Status: DC

## 2021-06-16 MED ORDER — OMEPRAZOLE 40 MG PO CPDR: 40.0000 mg | DELAYED_RELEASE_CAPSULE | Freq: Every day | ORAL | 0 refills | Status: DC

## 2021-06-16 NOTE — Telephone Encounter (Signed)
Medications sent for a 30 day supply

## 2021-06-16 NOTE — Telephone Encounter (Signed)
..   Encourage patient to contact the pharmacy for refills or they can request refills through [facility]:  10/09/2020 - [place] Done  NEXT APPOINTMENT DATE: 08/13/21 - TOC to [facility]: Lisinopril, sertraline, omeprazole  Is the patient out of medication?  yes  PHARMACY:  [facility]  Let patient know to contact pharmacy at the end of the day to make sure medication is ready.  Please notify patient to allow 48-72 hours to process  CLINICAL FILLS OUT ALL BELOW:   LAST REFILL:  QTY:  REFILL DATE:    OTHER COMMENTS:    Okay for refill?  Please advise

## 2021-08-13 ENCOUNTER — Ambulatory Visit (INDEPENDENT_AMBULATORY_CARE_PROVIDER_SITE_OTHER): Payer: Managed Care, Other (non HMO) | Admitting: Registered Nurse

## 2021-08-13 ENCOUNTER — Encounter: Payer: Self-pay | Admitting: Registered Nurse

## 2021-08-13 ENCOUNTER — Other Ambulatory Visit: Payer: Self-pay

## 2021-08-13 VITALS — BP 127/85 | HR 100 | Temp 98.1°F | Resp 18 | Ht 74.0 in | Wt 360.0 lb

## 2021-08-13 DIAGNOSIS — Z23 Encounter for immunization: Secondary | ICD-10-CM

## 2021-08-13 DIAGNOSIS — I152 Hypertension secondary to endocrine disorders: Secondary | ICD-10-CM

## 2021-08-13 DIAGNOSIS — E1159 Type 2 diabetes mellitus with other circulatory complications: Secondary | ICD-10-CM

## 2021-08-13 DIAGNOSIS — Z2821 Immunization not carried out because of patient refusal: Secondary | ICD-10-CM

## 2021-08-13 DIAGNOSIS — E278 Other specified disorders of adrenal gland: Secondary | ICD-10-CM

## 2021-08-13 DIAGNOSIS — I10 Essential (primary) hypertension: Secondary | ICD-10-CM | POA: Diagnosis not present

## 2021-08-13 DIAGNOSIS — K219 Gastro-esophageal reflux disease without esophagitis: Secondary | ICD-10-CM

## 2021-08-13 DIAGNOSIS — R351 Nocturia: Secondary | ICD-10-CM

## 2021-08-13 DIAGNOSIS — R7303 Prediabetes: Secondary | ICD-10-CM | POA: Diagnosis not present

## 2021-08-13 DIAGNOSIS — R197 Diarrhea, unspecified: Secondary | ICD-10-CM

## 2021-08-13 HISTORY — DX: Immunization not carried out because of patient refusal: Z28.21

## 2021-08-13 HISTORY — DX: Other specified disorders of adrenal gland: E27.8

## 2021-08-13 LAB — URINALYSIS, ROUTINE W REFLEX MICROSCOPIC
Bilirubin Urine: NEGATIVE
Hgb urine dipstick: NEGATIVE
Ketones, ur: NEGATIVE
Leukocytes,Ua: NEGATIVE
Nitrite: NEGATIVE
RBC / HPF: NONE SEEN (ref 0–?)
Specific Gravity, Urine: >=1.03 — AB (ref 1.000–1.030)
Total Protein, Urine: 30 — AB
Urine Glucose: >=1000 — AB
Urobilinogen, UA: 0.2 (ref 0.0–1.0)
pH: 5.5 (ref 5.0–8.0)

## 2021-08-13 LAB — PSA: PSA: 1.12 ng/mL (ref 0.10–4.00)

## 2021-08-13 MED ORDER — SERTRALINE HCL 100 MG PO TABS: 100.0000 mg | ORAL_TABLET | Freq: Every day | ORAL | 0 refills | Status: DC

## 2021-08-13 MED ORDER — TAMSULOSIN HCL 0.4 MG PO CAPS: 0.4000 mg | ORAL_CAPSULE | Freq: Every day | ORAL | 3 refills | Status: DC

## 2021-08-13 MED ORDER — OMEPRAZOLE 40 MG PO CPDR: 40.0000 mg | DELAYED_RELEASE_CAPSULE | Freq: Every day | ORAL | 0 refills | Status: DC

## 2021-08-13 MED ORDER — DICYCLOMINE HCL 10 MG PO CAPS: 10.0000 mg | ORAL_CAPSULE | Freq: Three times a day (TID) | ORAL | 0 refills | Status: DC

## 2021-08-13 MED ORDER — ROSUVASTATIN CALCIUM 20 MG PO TABS: 20.0000 mg | ORAL_TABLET | Freq: Every day | ORAL | 0 refills | Status: DC

## 2021-08-13 MED ORDER — LISINOPRIL-HYDROCHLOROTHIAZIDE 10-12.5 MG PO TABS: 1.0000 | ORAL_TABLET | Freq: Every day | ORAL | 0 refills | Status: DC

## 2021-08-13 MED ORDER — METFORMIN HCL ER 500 MG PO TB24: ORAL_TABLET | ORAL | 0 refills | Status: DC

## 2021-08-13 NOTE — Progress Notes (Signed)
Established Patient Office Visit  Subjective:  Patient ID: John Mullins, male    DOB: 07-Sep-1971  Age: 50 y.o. MRN: [id number]  CC:  Chief Complaint  Patient presents with   Transitions Of Care    Patient states he is here for a TOC and discuss some stomach issues.    HPI John Mullins presents for [facility]  Histories reviewed and updated with patient.   Last A1c:  Lab Results  Component Value Date   HGBA1C 7.6 (H) 10/09/2020    Currently taking: metformin 500mg  po qd No new complications Reports good compliance with medications Diet has been steady - lots of veggies. Exercise habits have been steady - very active job as Clinical biochemist  Hypertension: Patient Currently taking: lisinopril-hctz 10-12.5mg  po qd Good effect. No AEs. Denies CV symptoms including: chest pain, shob, doe, headache, visual changes, fatigue, claudication, and dependent edema.   Previous readings and labs: BP Readings from Last 3 Encounters:  08/13/21 127/85  10/09/20 130/80  07/03/20 130/86   Lab Results  Component Value Date   CREATININE 0.82 10/09/2020    Hld Taking rosuvastatin 20mg  po qd Good effect, no AE Lab Results  Component Value Date   CHOL 178 10/09/2020   HDL 34.20 (L) 10/09/2020   LDLCALC 107 (H) 05/10/2019   LDLDIRECT 110.0 10/09/2020   TRIG 219.0 (H) 10/09/2020   CHOLHDL 5 10/09/2020    GERD Taking omeprazole 40mg  po qd, good effect, no AE  Depression Taking sertraline 100mg  po qd Good effect, no AE Hopes to continue.  Hx of diverticulitis In last mo, notes that when he consumes a lot of greens, he will get upset stomach, very loose bowels.  Sometimes loose, sometimes watery. No pain with BM , no abdominal pain, nausea, or vomiting. Can happen with other vegetables. No extra belching or flatulence.  Bladder -  2 weeks - has to urinate once every 1-2 hours. Sometimes 5-6 times at night No dysuria or hematuria No STI  Past Medical History:  Diagnosis Date    Anxiety    Diabetes mellitus without complication ([facility])    GERD (gastroesophageal reflux disease)    Hypertension    Sleep apnea     Past Surgical History:  Procedure Laterality Date   ARTHOSCOPIC ROTAOR CUFF REPAIR Right 10/03/2019   Procedure: ARTHROSCOPIC ROTATOR CUFF REPAIR;  Surgeon: Tania Ade, MD;  Location: [facility];  Service: Orthopedics;  Laterality: Right;   FRACTURE SURGERY Right 1990s   SHOULDER ARTHROSCOPY WITH SUBACROMIAL DECOMPRESSION Right 10/03/2019   Procedure: SHOULDER ARTHROSCOPY WITH SUBACROMIAL DECOMPRESSION;  Surgeon: Tania Ade, MD;  Location: [facility];  Service: Orthopedics;  Laterality: Right;    Family History  Problem Relation Age of Onset   Heart disease Father    Stroke Father    Diabetes Father     Social History   Socioeconomic History   Marital status: Married    Spouse name: Not on file   Number of children: 1   Years of education: Not on file   Highest education level: Not on file  Occupational History   Not on file  Tobacco Use   Smoking status: Never   Smokeless tobacco: Never  Vaping Use   Vaping Use: Never used  Substance and Sexual Activity   Alcohol use: No   Drug use: No   Sexual activity: Yes  Other Topics Concern   Not on file  Social History Narrative   Not on file   Social Determinants of  Health   Financial Resource Strain: Not on file  Food Insecurity: Not on file  Transportation Needs: Not on file  Physical Activity: Not on file  Stress: Not on file  Social Connections: Not on file  Intimate Partner Violence: Not on file    Outpatient Medications Prior to Visit  Medication Sig Dispense Refill   lisinopril-hydrochlorothiazide (ZESTORETIC) 10-12.5 MG tablet Take 1 tablet by mouth daily. 30 tablet 0   metFORMIN (GLUCOPHAGE-XR) 500 MG 24 hr tablet TAKE 2 TABLETS BY MOUTH ONCE DAILY WITH BREAKFAST AND 1 TABLET WITH DINNER 270 tablet 0   omeprazole (PRILOSEC) 40 MG capsule Take 1 capsule (40 mg total) by  mouth daily. 30 capsule 0   rosuvastatin (CRESTOR) 20 MG tablet Take 1 tablet (20 mg total) by mouth daily. 90 tablet 0   sertraline (ZOLOFT) 100 MG tablet Take 1 tablet (100 mg total) by mouth daily. 30 tablet 0   No facility-administered medications prior to visit.    No Known Allergies  ROS Review of Systems    Objective:    Physical Exam  BP 127/85   Pulse 100   Temp 98.1 F (36.7 C) (Temporal)   Resp 18   Ht 6\' 2"  (1.88 m)   Wt (!) 360 lb (163.3 kg)   SpO2 99%   BMI 46.22 kg/m  Wt Readings from Last 3 Encounters:  08/13/21 (!) 360 lb (163.3 kg)  10/09/20 (!) 368 lb (166.9 kg)  07/03/20 (!) 372 lb (168.7 kg)     Health Maintenance Due  Topic Date Due   COVID-19 Vaccine (1) Never done   Zoster Vaccines- Shingrix (1 of 2) Never done   INFLUENZA VACCINE  05/31/2021    There are no preventive care reminders to display for this patient.  Lab Results  Component Value Date   TSH 2.418 06/10/2015   Lab Results  Component Value Date   WBC 17.3 (H) 11/23/2019   HGB 12.8 (L) 11/23/2019   HCT 40.3 11/23/2019   MCV 83.8 11/23/2019   PLT 422 (H) 11/23/2019   Lab Results  Component Value Date   NA 135 10/09/2020   K 4.3 10/09/2020   CO2 28 10/09/2020   GLUCOSE 128 (H) 10/09/2020   BUN 13 10/09/2020   CREATININE 0.82 10/09/2020   BILITOT 0.6 10/09/2020   ALKPHOS 108 10/09/2020   AST 22 10/09/2020   ALT 42 10/09/2020   PROT 7.3 10/09/2020   ALBUMIN 4.2 10/09/2020   CALCIUM 9.7 10/09/2020   ANIONGAP 8 11/23/2019   GFR 102.55 10/09/2020   Lab Results  Component Value Date   CHOL 178 10/09/2020   Lab Results  Component Value Date   HDL 34.20 (L) 10/09/2020   Lab Results  Component Value Date   LDLCALC 107 (H) 05/10/2019   Lab Results  Component Value Date   TRIG 219.0 (H) 10/09/2020   Lab Results  Component Value Date   CHOLHDL 5 10/09/2020   Lab Results  Component Value Date   HGBA1C 7.6 (H) 10/09/2020      Assessment & Plan:    Problem List Items Addressed This Visit       Digestive   GERD (gastroesophageal reflux disease)   Relevant Medications   omeprazole (PRILOSEC) 40 MG capsule   dicyclomine (BENTYL) 10 MG capsule   Other Visit Diagnoses     Nocturia    -  Primary   Relevant Medications   tamsulosin (FLOMAX) 0.4 MG CAPS capsule   Other Relevant Orders  PSA   Urinalysis   Prediabetes       Relevant Medications   metFORMIN (GLUCOPHAGE-XR) 500 MG 24 hr tablet   Essential hypertension       Relevant Medications   lisinopril-hydrochlorothiazide (ZESTORETIC) 10-12.5 MG tablet   rosuvastatin (CRESTOR) 20 MG tablet   Frequent diarrhea       Relevant Medications   dicyclomine (BENTYL) 10 MG capsule   Flu vaccine need       Relevant Orders   Flu Vaccine QUAD 6+ mos PF IM (Fluarix Quad PF)       Meds ordered this encounter  Medications   metFORMIN (GLUCOPHAGE-XR) 500 MG 24 hr tablet    Sig: TAKE 2 TABLETS BY MOUTH ONCE DAILY WITH BREAKFAST AND 1 TABLET WITH DINNER    Dispense:  270 tablet    Refill:  0    Order Specific Question:   Supervising Provider    Answer:   Carlota Raspberry, JEFFREY R [[id number]]   lisinopril-hydrochlorothiazide (ZESTORETIC) 10-12.5 MG tablet    Sig: Take 1 tablet by mouth daily.    Dispense:  30 tablet    Refill:  0    Order Specific Question:   Supervising Provider    Answer:   Carlota Raspberry, JEFFREY R [[id number]]   sertraline (ZOLOFT) 100 MG tablet    Sig: Take 1 tablet (100 mg total) by mouth daily.    Dispense:  30 tablet    Refill:  0    Order Specific Question:   Supervising Provider    Answer:   Carlota Raspberry, JEFFREY R [[id number]]   rosuvastatin (CRESTOR) 20 MG tablet    Sig: Take 1 tablet (20 mg total) by mouth daily.    Dispense:  90 tablet    Refill:  0    Order Specific Question:   Supervising Provider    Answer:   Carlota Raspberry, JEFFREY R [[id number]]   omeprazole (PRILOSEC) 40 MG capsule    Sig: Take 1 capsule (40 mg total) by mouth daily.    Dispense:  30 capsule    Refill:  0    Order  Specific Question:   Supervising Provider    Answer:   Carlota Raspberry, JEFFREY R [[id number]]   dicyclomine (BENTYL) 10 MG capsule    Sig: Take 1 capsule (10 mg total) by mouth 3 (three) times daily before meals.    Dispense:  90 capsule    Refill:  0    Order Specific Question:   Supervising Provider    Answer:   Carlota Raspberry, JEFFREY R [[id number]]   tamsulosin (FLOMAX) 0.4 MG CAPS capsule    Sig: Take 1 capsule (0.4 mg total) by mouth daily.    Dispense:  30 capsule    Refill:  3    Order Specific Question:   Supervising Provider    Answer:   Carlota Raspberry, JEFFREY R [[id number]]    Follow-up: Return in about 6 months (around 02/11/2022) for htn, t2dm, hld.    Maximiano Coss, NP

## 2021-08-13 NOTE — Patient Instructions (Addendum)
[name] -  Doristine Devoid to meet you  BP looking great today. Keep up the awesome work on the weight loss and we'll probably  able to consider lowering dose of BP meds.  Continue meds as prescribed. Labs will be back this afternoon, I'll call with any urgent concerns.  Start flomax 0.4mg  once daily for frequent urination. I'll let you know how labs look as far as any concerns here.  Can take dicyclomine 10mg  three times daily with meals as needed for diarrhea/loose stools. Let me know if things get worse or don't get better. Check out fodmap diet sheet to see if there's any correlation.  See you in 6 mo, sooner if labs indicate. Certainly call if you need anything   Thank you  [name]     If you have lab work done today you will be contacted with your lab results within the next 2 weeks.  If you have not heard from [place] then please contact us. The fastest way to get your results is to register for My Chart.   IF you received an x-ray today, you will receive an invoice from [facility]. Please contact [facility] at [phone] with questions or concerns regarding your invoice.   IF you received labwork today, you will receive an invoice from [facility]. Please contact [facility] at [phone] with questions or concerns regarding your invoice.   Our billing staff will not be able to assist you with questions regarding bills from these companies.  You will be contacted with the lab results as soon as they are available. The fastest way to get your results is to activate your My Chart account. Instructions are located on the last page of this paperwork. If you have not heard from [place] regarding the results in 2 weeks, please contact this office.     Diabetes Mellitus and [place] with and managing diabetes (diabetes mellitus) can be complicated. Your diabetes treatment may be managed by a team of health care providers, including: A physician who  specializes in diabetes (endocrinologist). You might also have visits with a nurse practitioner or physician assistant. Nurses. A registered dietitian. A certified diabetes care and education specialist. An exercise specialist. A pharmacist. An eye doctor. A foot specialist (podiatrist). A dental care provider. A primary care provider. A mental health care provider. How to manage your diabetes You can do many things to successfully manage your diabetes. Your health care providers will follow guidelines to help you get the best quality of care. Here are general guidelines for your diabetes management plan. Your health care providers may give you more specific instructions. Physical exams When you are diagnosed with diabetes, and each year after that, your health care provider will ask about your medical and family history. You will have a physical exam, which may include: Measuring your height, weight, and body mass index (BMI). Checking your blood pressure. This will be done at every routine medical visit. Your target blood pressure may vary depending on your medical conditions, your age, and other factors. A thyroid exam. A skin exam. Screening for nerve damage (peripheral neuropathy). This may include checking the pulse in your legs and feet and the level of sensation in your hands and feet. A foot exam to inspect the structure and skin of your feet, including checking for cuts, bruises, redness, blisters, sores, or other problems. Screening for blood vessel (vascular) problems. This may include checking the pulse in your legs and feet and checking your  temperature. Blood tests Depending on your treatment plan and your personal needs, you may have the following tests: Hemoglobin A1C (HbA1C). This test provides information about blood sugar (glucose) control over the previous 2-3 months. It is used to adjust your treatment plan, if needed. This test will be done: At least 2 times a year, if  you are meeting your treatment goals. 4 times a year, if you are not meeting your treatment goals or if your goals have changed. Lipid testing, including total cholesterol, LDL and HDL cholesterol, and triglyceride levels. The goal for LDL is less than 100 mg/dL (5.5 mmol/L). If you are at high risk for complications, the goal is less than 70 mg/dL (3.9 mmol/L). The goal for HDL is 40 mg/dL (2.2 mmol/L) or higher for men, and 50 mg/dL (2.8 mmol/L) or higher for women. An HDL cholesterol of 60 mg/dL (3.3 mmol/L) or higher gives some protection against heart disease. The goal for triglycerides is less than 150 mg/dL (8.3 mmol/L). Liver function tests. Kidney function tests. Thyroid function tests.  Dental and eye exams  Visit your dentist two times a year. If you have type 1 diabetes, your health care provider may recommend an eye exam within 5 years after you are diagnosed, and then once a year after your first exam. For children with type 1 diabetes, the health care provider may recommend an eye exam when your child is age 36 or older and has had diabetes for 3-5 years. After the first exam, your child should get an eye exam once a year. If you have type 2 diabetes, your health care provider may recommend an eye exam as soon as you are diagnosed, and then every 1-2 years after your first exam. Immunizations A yearly flu (influenza) vaccine is recommended annually for everyone 6 months or older. This is especially important if you have diabetes. The pneumonia (pneumococcal) vaccine is recommended for everyone 2 years or older who has diabetes. If you are age 41 or older, you may get the pneumonia vaccine as a series of two separate shots. The hepatitis B vaccine is recommended for adults shortly after being diagnosed with diabetes. Adults and children with diabetes should receive all other vaccines according to age-specific recommendations from the Centers for Disease Control and Prevention  (CDC). Mental and emotional health Screening for symptoms of eating disorders, anxiety, and depression is recommended at the time of diagnosis and after as needed. If your screening shows that you have symptoms, you may need more evaluation. You may work with a mental health care provider. Follow these instructions at home: Treatment plan You will monitor your blood glucose levels and may give yourself insulin. Your treatment plan will be reviewed at every medical visit. You and your health care provider will discuss: How you are taking your medicines, including insulin. Any side effects you have. Your blood glucose level target goals. How often you monitor your blood glucose level. Lifestyle habits, such as activity level and tobacco, alcohol, and substance use. Education Your health care provider will assess how well you are monitoring your blood glucose levels and whether you are taking your insulin and medicines correctly. He or she may refer you to: A certified diabetes care and education specialist to manage your diabetes throughout your life, starting at diagnosis. A registered dietitian who can create and review your personal nutrition plan. An exercise specialist who can discuss your activity level and exercise plan. General instructions Take over-the-counter and prescription medicines only as told  by your health care provider. Keep all follow-up visits. This is important. Where to find support There are many diabetes support networks, including: American Diabetes Association (ADA): diabetes.org Defeat Diabetes Foundation: defeatdiabetes.org Where to find more information American Diabetes Association (ADA): www.diabetes.org Association of Diabetes Care & Education Specialists (ADCES): diabeteseducator.org International Diabetes Federation (IDF): https://www.munoz-bell.org/ Summary Managing diabetes (diabetes mellitus) can be complicated. Your diabetes treatment may be managed by a team of health  care providers. Your health care providers follow guidelines to help you get the best quality care. You should have physical exams, blood tests, blood pressure monitoring, immunizations, and screening tests regularly. Stay updated on how to manage your diabetes. Your health care providers may also give you more specific instructions based on your individual health. This information is not intended to replace advice given to you by your health care provider. Make sure you discuss any questions you have with your health care provider. Document Revised: 04/23/2020 Document Reviewed: 04/23/2020 Elsevier Patient Education  [place].  Diabetes Mellitus and [place] care is an important part of your health, especially when you have diabetes. Diabetes may cause you to have problems because of poor blood flow (circulation) to your feet and legs, which can cause your skin to: Become thinner and drier. Break more easily. Heal more slowly. Peel and crack. You may also have nerve damage (neuropathy) in your legs and feet, causing decreased feeling in them. This means that you may not notice minor injuries to your feet that could lead to more serious problems. Noticing and addressing any potential problems early is the best way to prevent future foot problems. How to care for your feet Foot hygiene  Wash your feet daily with warm water and mild soap. Do not use hot water. Then, pat your feet and the areas between your toes until they are completely dry. Do not soak your feet as this can dry your skin. Trim your toenails straight across. Do not dig under them or around the cuticle. File the edges of your nails with an emery board or nail file. Apply a moisturizing lotion or petroleum jelly to the skin on your feet and to dry, brittle toenails. Use lotion that does not contain alcohol and is unscented. Do not apply lotion between your toes. Shoes and socks Wear clean socks or stockings every  day. Make sure they are not too tight. Do not wear knee-high stockings since they may decrease blood flow to your legs. Wear shoes that fit properly and have enough cushioning. Always look in your shoes before you put them on to be sure there are no objects inside. To break in new shoes, wear them for just a few hours a day. This prevents injuries on your feet. Wounds, scrapes, corns, and calluses  Check your feet daily for blisters, cuts, bruises, sores, and redness. If you cannot see the bottom of your feet, use a mirror or ask someone for help. Do not cut corns or calluses or try to remove them with medicine. If you find a minor scrape, cut, or break in the skin on your feet, keep it and the skin around it clean and dry. You may clean these areas with mild soap and water. Do not clean the area with peroxide, alcohol, or iodine. If you have a wound, scrape, corn, or callus on your foot, look at it several times a day to make sure it is healing and not infected. Check for: Redness, swelling, or pain. Fluid  or blood. Warmth. Pus or a bad smell. General tips Do not cross your legs. This may decrease blood flow to your feet. Do not use heating pads or hot water bottles on your feet. They may burn your skin. If you have lost feeling in your feet or legs, you may not know this is happening until it is too late. Protect your feet from hot and cold by wearing shoes, such as at the beach or on hot pavement. Schedule a complete foot exam at least once a year (annually) or more often if you have foot problems. Report any cuts, sores, or bruises to your health care provider immediately. Where to find more information American Diabetes Association: www.diabetes.org Association of Diabetes Care & Education Specialists: www.diabeteseducator.org Contact a health care provider if: You have a medical condition that increases your risk of infection and you have any cuts, sores, or bruises on your feet. You  have an injury that is not healing. You have redness on your legs or feet. You feel burning or tingling in your legs or feet. You have pain or cramps in your legs and feet. Your legs or feet are numb. Your feet always feel cold. You have pain around any toenails. Get help right away if: You have a wound, scrape, corn, or callus on your foot and: You have pain, swelling, or redness that gets worse. You have fluid or blood coming from the wound, scrape, corn, or callus. Your wound, scrape, corn, or callus feels warm to the touch. You have pus or a bad smell coming from the wound, scrape, corn, or callus. You have a fever. You have a red line going up your leg. Summary Check your feet every day for blisters, cuts, bruises, sores, and redness. Apply a moisturizing lotion or petroleum jelly to the skin on your feet and to dry, brittle toenails. Wear shoes that fit properly and have enough cushioning. If you have foot problems, report any cuts, sores, or bruises to your health care provider immediately. Schedule a complete foot exam at least once a year (annually) or more often if you have foot problems. This information is not intended to replace advice given to you by your health care provider. Make sure you discuss any questions you have with your health care provider. Document Revised: 05/07/2020 Document Reviewed: 05/07/2020 Elsevier Patient Education  [place].

## 2021-08-31 ENCOUNTER — Encounter: Payer: Self-pay | Admitting: Registered Nurse

## 2021-08-31 ENCOUNTER — Telehealth: Payer: Self-pay | Admitting: Registered Nurse

## 2021-08-31 ENCOUNTER — Other Ambulatory Visit: Payer: Self-pay | Admitting: Registered Nurse

## 2021-08-31 DIAGNOSIS — B370 Candidal stomatitis: Secondary | ICD-10-CM

## 2021-08-31 MED ORDER — NYSTATIN 100000 UNIT/ML MT SUSP: 5.0000 mL | Freq: Four times a day (QID) | OROMUCOSAL | 0 refills | Status: DC

## 2021-08-31 NOTE — Telephone Encounter (Signed)
Please advise 

## 2021-08-31 NOTE — Telephone Encounter (Signed)
Patient is having problems with Flomax, but also has developed a bad case of thrush.  He would like to know if you can send in mouthwash for the thrush

## 2021-09-06 ENCOUNTER — Encounter: Payer: Self-pay | Admitting: Registered Nurse

## 2021-09-06 ENCOUNTER — Ambulatory Visit (INDEPENDENT_AMBULATORY_CARE_PROVIDER_SITE_OTHER): Payer: Managed Care, Other (non HMO) | Admitting: Registered Nurse

## 2021-09-06 VITALS — BP 110/65 | HR 108 | Temp 98.0°F | Resp 16 | Ht 74.0 in | Wt 338.6 lb

## 2021-09-06 DIAGNOSIS — R7303 Prediabetes: Secondary | ICD-10-CM | POA: Diagnosis not present

## 2021-09-06 DIAGNOSIS — R110 Nausea: Secondary | ICD-10-CM

## 2021-09-06 DIAGNOSIS — E1165 Type 2 diabetes mellitus with hyperglycemia: Secondary | ICD-10-CM

## 2021-09-06 LAB — BASIC METABOLIC PANEL
BUN: 17 mg/dL (ref 6–23)
CO2: 21 mEq/L (ref 19–32)
Calcium: 10.2 mg/dL (ref 8.4–10.5)
Chloride: 95 mEq/L — ABNORMAL LOW (ref 96–112)
Creatinine, Ser: 0.91 mg/dL (ref 0.40–1.50)
GFR: 97.76 mL/min (ref >60.00)
Glucose, Bld: 395 mg/dL — ABNORMAL HIGH (ref 70–99)
Potassium: 4 mEq/L (ref 3.5–5.1)
Sodium: 133 mEq/L — ABNORMAL LOW (ref 135–145)

## 2021-09-06 LAB — LIPID PANEL
Cholesterol: 243 mg/dL — ABNORMAL HIGH (ref 0–200)
HDL: 32.7 mg/dL — ABNORMAL LOW (ref >39.00)
NonHDL: 209.93
Total CHOL/HDL Ratio: 7
Triglycerides: 333 mg/dL — ABNORMAL HIGH (ref 0.0–149.0)
VLDL: 66.6 mg/dL — ABNORMAL HIGH (ref 0.0–40.0)

## 2021-09-06 LAB — POCT GLYCOSYLATED HEMOGLOBIN (HGB A1C): Hemoglobin A1C: 14.1 % — AB (ref 4.0–5.6)

## 2021-09-06 LAB — LDL CHOLESTEROL, DIRECT: Direct LDL: 180 mg/dL

## 2021-09-06 MED ORDER — ONDANSETRON 4 MG PO TBDP: 4.0000 mg | ORAL_TABLET | Freq: Three times a day (TID) | ORAL | 0 refills | Status: DC | PRN

## 2021-09-06 MED ORDER — SITAGLIPTIN PHOSPHATE 100 MG PO TABS: 100.0000 mg | ORAL_TABLET | Freq: Every day | ORAL | 0 refills | Status: DC

## 2021-09-06 NOTE — Progress Notes (Signed)
Established Patient Office Visit  Subjective:  Patient ID: John Mullins, male    DOB: 1971/06/01  Age: 50 y.o. MRN: [id number]  CC:  Chief Complaint  Patient presents with   Follow-up    Patient states he has been here and changed some medications and does not know if he is having a reaction to any. Patient states he is nauseous when he wake up and has not been eating because he has not been feeling the best.      HPI John Mullins presents for nausea.  Ongoing for a few weeks.  Worsening. At times has come with frequent urination, but this has resolved. Nauseous on waking.  New med a few weeks ago - flomax - for nocturia.  Effective for nocturia.  He did get thrush and a cold sore not long after starting this - unsure if related Took valtrex for cold sore with good effect.   Appetite has been poor - low portions, eating less often  He has lost 22 lbs since last visit   No abdominal pain, vomiting, diarrhea, constipation. Past Medical History:  Diagnosis Date   Anxiety    Diabetes mellitus without complication ([place])    GERD (gastroesophageal reflux disease)    Hypertension    Sleep apnea     Past Surgical History:  Procedure Laterality Date   ARTHOSCOPIC ROTAOR CUFF REPAIR Right 10/03/2019   Procedure: ARTHROSCOPIC ROTATOR CUFF REPAIR;  Surgeon: Tania Ade, MD;  Location: [facility];  Service: Orthopedics;  Laterality: Right;   FRACTURE SURGERY Right 1990s   SHOULDER ARTHROSCOPY WITH SUBACROMIAL DECOMPRESSION Right 10/03/2019   Procedure: SHOULDER ARTHROSCOPY WITH SUBACROMIAL DECOMPRESSION;  Surgeon: Tania Ade, MD;  Location: [facility];  Service: Orthopedics;  Laterality: Right;    Family History  Problem Relation Age of Onset   Heart disease Father    Stroke Father    Diabetes Father     Social History   Socioeconomic History   Marital status: Married    Spouse name: Not on file   Number of children: 1   Years of education: Not on file   Highest  education level: Not on file  Occupational History   Not on file  Tobacco Use   Smoking status: Never   Smokeless tobacco: Never  Vaping Use   Vaping Use: Never used  Substance and Sexual Activity   Alcohol use: No   Drug use: No   Sexual activity: Yes  Other Topics Concern   Not on file  Social History Narrative   Not on file   Social Determinants of Health   Financial Resource Strain: Not on file  Food Insecurity: Not on file  Transportation Needs: Not on file  Physical Activity: Not on file  Stress: Not on file  Social Connections: Not on file  Intimate Partner Violence: Not on file    Outpatient Medications Prior to Visit  Medication Sig Dispense Refill   dicyclomine (BENTYL) 10 MG capsule Take 1 capsule (10 mg total) by mouth 3 (three) times daily before meals. 90 capsule 0   lisinopril-hydrochlorothiazide (ZESTORETIC) 10-12.5 MG tablet Take 1 tablet by mouth daily. 30 tablet 0   metFORMIN (GLUCOPHAGE-XR) 500 MG 24 hr tablet TAKE 2 TABLETS BY MOUTH ONCE DAILY WITH BREAKFAST AND 1 TABLET WITH DINNER 270 tablet 0   nystatin (MYCOSTATIN) 100000 UNIT/ML suspension Take 5 mLs (500,000 Units total) by mouth 4 (four) times daily. 60 mL 0   omeprazole (PRILOSEC) 40 MG capsule Take  1 capsule (40 mg total) by mouth daily. 30 capsule 0   rosuvastatin (CRESTOR) 20 MG tablet Take 1 tablet (20 mg total) by mouth daily. 90 tablet 0   sertraline (ZOLOFT) 100 MG tablet Take 1 tablet (100 mg total) by mouth daily. 30 tablet 0   tamsulosin (FLOMAX) 0.4 MG CAPS capsule Take 1 capsule (0.4 mg total) by mouth daily. 30 capsule 3   No facility-administered medications prior to visit.    No Known Allergies  ROS Review of Systems  Constitutional:  Positive for unexpected weight change. Negative for activity change, appetite change, chills, diaphoresis, fatigue and fever.  HENT: Negative.    Eyes: Negative.   Respiratory: Negative.    Cardiovascular: Negative.  Negative for chest pain.   Gastrointestinal:  Positive for nausea. Negative for abdominal distention, abdominal pain, anal bleeding, blood in stool, constipation, diarrhea, rectal pain and vomiting.  Endocrine: Positive for polydipsia and polyuria. Negative for cold intolerance, heat intolerance and polyphagia.  Genitourinary: Negative.   Musculoskeletal: Negative.   Skin: Negative.   Allergic/Immunologic: Negative.   Neurological: Negative.   Hematological: Negative.   Psychiatric/Behavioral: Negative.    All other systems reviewed and are negative.    Objective:    Physical Exam  BP 110/65   Pulse (!) 108   Temp 98 F (36.7 C) (Temporal)   Resp 16   Ht 6\' 2"  (1.88 m)   Wt (!) 338 lb 9.6 oz (153.6 kg)   BMI 43.47 kg/m  Wt Readings from Last 3 Encounters:  09/06/21 (!) 338 lb 9.6 oz (153.6 kg)  08/13/21 (!) 360 lb (163.3 kg)  10/09/20 (!) 368 lb (166.9 kg)     Health Maintenance Due  Topic Date Due   COVID-19 Vaccine (1) Never done   Pneumococcal Vaccine 33-63 Years old (1 - PCV) Never done   FOOT EXAM  Never done   OPHTHALMOLOGY EXAM  Never done   Zoster Vaccines- Shingrix (1 of 2) Never done    There are no preventive care reminders to display for this patient.  Lab Results  Component Value Date   TSH 2.418 06/10/2015   Lab Results  Component Value Date   WBC 17.3 (H) 11/23/2019   HGB 12.8 (L) 11/23/2019   HCT 40.3 11/23/2019   MCV 83.8 11/23/2019   PLT 422 (H) 11/23/2019   Lab Results  Component Value Date   NA 135 10/09/2020   K 4.3 10/09/2020   CO2 28 10/09/2020   GLUCOSE 128 (H) 10/09/2020   BUN 13 10/09/2020   CREATININE 0.82 10/09/2020   BILITOT 0.6 10/09/2020   ALKPHOS 108 10/09/2020   AST 22 10/09/2020   ALT 42 10/09/2020   PROT 7.3 10/09/2020   ALBUMIN 4.2 10/09/2020   CALCIUM 9.7 10/09/2020   ANIONGAP 8 11/23/2019   GFR 102.55 10/09/2020   Lab Results  Component Value Date   CHOL 178 10/09/2020   Lab Results  Component Value Date   HDL 34.20 (L)  10/09/2020   Lab Results  Component Value Date   LDLCALC 107 (H) 05/10/2019   Lab Results  Component Value Date   TRIG 219.0 (H) 10/09/2020   Lab Results  Component Value Date   CHOLHDL 5 10/09/2020   Lab Results  Component Value Date   HGBA1C 14.1 (A) 09/06/2021      Assessment & Plan:   Problem List Items Addressed This Visit   None Visit Diagnoses     Uncontrolled type 2 diabetes mellitus with  hyperglycemia ([place])    -  Primary   Relevant Medications   sitaGLIPtin (JANUVIA) 100 MG tablet   Other Relevant Orders   Basic Metabolic Panel (BMET)   Lipid panel   Prediabetes       Relevant Orders   POCT glycosylated hemoglobin (Hb A1C) (Completed)   Nausea without vomiting       Relevant Medications   ondansetron (ZOFRAN-ODT) 4 MG disintegrating tablet       Meds ordered this encounter  Medications   sitaGLIPtin (JANUVIA) 100 MG tablet    Sig: Take 1 tablet (100 mg total) by mouth daily.    Dispense:  90 tablet    Refill:  0    Order Specific Question:   Supervising Provider    Answer:   Carlota Raspberry, JEFFREY R [[id number]]   ondansetron (ZOFRAN-ODT) 4 MG disintegrating tablet    Sig: Take 1 tablet (4 mg total) by mouth every 8 (eight) hours as needed for nausea or vomiting.    Dispense:  20 tablet    Refill:  0    Order Specific Question:   Supervising Provider    Answer:   Carlota Raspberry, JEFFREY R [[id number]]     Follow-up: No follow-ups on file.   PLAN A1c today up to 14.1. will increase metformin to 1000mg  XR po qd and add januvia 100mg  po qd. Discussed in depth diet and exercise control. Recehck 6-8 weeks. Labs collected. Will follow up with the patient as warranted. Zofran for nausea Patient encouraged to call clinic with any questions, comments, or concerns.  Maximiano Coss, NP

## 2021-09-06 NOTE — Patient Instructions (Addendum)
John Mullins -   Doristine Devoid to see you,  Sugars are too high. Double metformin, start [place]  Zofran ok for nausea.  Weight loss likely related to paradoxical loss secondary to high sugars.  See you in 6-8 weeks for recheck  Thanks,  Rich     If you have lab work done today you will be contacted with your lab results within the next 2 weeks.  If you have not heard from [place] then please contact us. The fastest way to get your results is to register for My Chart.   IF you received an x-ray today, you will receive an invoice from [facility]. Please contact [facility] at [phone] with questions or concerns regarding your invoice.   IF you received labwork today, you will receive an invoice from [facility]. Please contact [facility] at [phone] with questions or concerns regarding your invoice.   Our billing staff will not be able to assist you with questions regarding bills from these companies.  You will be contacted with the lab results as soon as they are available. The fastest way to get your results is to activate your My Chart account. Instructions are located on the last page of this paperwork. If you have not heard from [place] regarding the results in 2 weeks, please contact this office.

## 2021-09-08 ENCOUNTER — Telehealth: Payer: Self-pay | Admitting: Registered Nurse

## 2021-09-08 NOTE — Telephone Encounter (Signed)
Noted  

## 2021-09-08 NOTE — Telephone Encounter (Signed)
Patient's wife called back and said that patient is still not feeling well and that he has been waiting all day for someone to get back in touch with him.  Please advise.

## 2021-09-08 NOTE — Telephone Encounter (Signed)
We'll keep working on [employer].  Thank you  Rich

## 2021-09-08 NOTE — Telephone Encounter (Signed)
Called and spoke with patient about his concerns. Patient stated that he feels better now. He took something for nausea and it helped. He does not need a glucose meter. He stated that, that was just something that his wife had suggested he get. Patient would just like a PA processed for his Januvia Rx.

## 2021-09-08 NOTE — Telephone Encounter (Signed)
..  Caller name:John Mullins On DPR? :yes/no: Yes  Call back [phone]  Provider they see: Orland Mustard Reason for call: Questions about prior authorization for Januvia - and patient still not able to work - has questions about getting a glucose meter to check his diabetes

## 2021-09-09 ENCOUNTER — Encounter: Payer: Self-pay | Admitting: Registered Nurse

## 2021-09-10 ENCOUNTER — Encounter: Payer: Self-pay | Admitting: Registered Nurse

## 2021-09-10 ENCOUNTER — Telehealth: Payer: Self-pay

## 2021-09-10 NOTE — Telephone Encounter (Signed)
I sent patient a message earlier informing him that I needed his insurance information to process the PA.

## 2021-09-10 NOTE — Telephone Encounter (Signed)
John Mullins did you ever see a PA for this patient. I know that you were waiting for more information from the patient as well as far as insurance did you receive that? If not I will try to start a PA now.

## 2021-09-10 NOTE — Telephone Encounter (Signed)
A PA was processed today for patient through his insurance for Januvia 100mg . Patient has been approved starting 09/10/21-10/30/2098. Patient has been called and notified.

## 2021-09-10 NOTE — Telephone Encounter (Signed)
Caller name:Cordai Skeet Simmer   On DPR? :yes  Call back number:[phone]  Provider they see: Delfino Lovett  Reason for call:Calling Sabrina back regarding results on authorization on  sitaGLIPtin (JANUVIA) 100 MG tablet

## 2021-10-18 ENCOUNTER — Ambulatory Visit: Payer: Managed Care, Other (non HMO) | Admitting: Registered Nurse

## 2021-10-18 ENCOUNTER — Encounter: Payer: Self-pay | Admitting: Registered Nurse

## 2021-10-18 VITALS — BP 119/84 | HR 103 | Temp 97.9°F | Resp 18 | Ht 72.0 in | Wt 320.0 lb

## 2021-10-18 DIAGNOSIS — J019 Acute sinusitis, unspecified: Secondary | ICD-10-CM

## 2021-10-18 DIAGNOSIS — E1165 Type 2 diabetes mellitus with hyperglycemia: Secondary | ICD-10-CM | POA: Diagnosis not present

## 2021-10-18 DIAGNOSIS — I10 Essential (primary) hypertension: Secondary | ICD-10-CM

## 2021-10-18 DIAGNOSIS — R7303 Prediabetes: Secondary | ICD-10-CM | POA: Diagnosis not present

## 2021-10-18 DIAGNOSIS — B9689 Other specified bacterial agents as the cause of diseases classified elsewhere: Secondary | ICD-10-CM

## 2021-10-18 DIAGNOSIS — F411 Generalized anxiety disorder: Secondary | ICD-10-CM | POA: Diagnosis not present

## 2021-10-18 LAB — POCT GLYCOSYLATED HEMOGLOBIN (HGB A1C): Hemoglobin A1C: 11.3 % — AB (ref 4.0–5.6)

## 2021-10-18 MED ORDER — METFORMIN HCL ER 500 MG PO TB24: ORAL_TABLET | ORAL | 0 refills | Status: DC

## 2021-10-18 MED ORDER — SITAGLIPTIN PHOSPHATE 100 MG PO TABS
100.0000 mg | ORAL_TABLET | Freq: Every day | ORAL | 0 refills | Status: DC
Start: 2021-10-18 — End: 2021-10-18

## 2021-10-18 MED ORDER — DULAGLUTIDE 1.5 MG/0.5ML ~~LOC~~ SOAJ: 1.5000 mg | SUBCUTANEOUS | 0 refills | Status: DC

## 2021-10-18 MED ORDER — TRULICITY 0.75 MG/0.5ML ~~LOC~~ SOAJ: 0.7500 mg | SUBCUTANEOUS | 0 refills | Status: DC

## 2021-10-18 MED ORDER — FLUTICASONE PROPIONATE 50 MCG/ACT NA SUSP: 2.0000 | Freq: Every day | NASAL | 6 refills | Status: DC

## 2021-10-18 MED ORDER — SERTRALINE HCL 100 MG PO TABS
100.0000 mg | ORAL_TABLET | Freq: Every day | ORAL | 0 refills | Status: DC
Start: 2021-10-18 — End: 2022-01-27

## 2021-10-18 MED ORDER — LISINOPRIL-HYDROCHLOROTHIAZIDE 10-12.5 MG PO TABS: 1.0000 | ORAL_TABLET | Freq: Every day | ORAL | 0 refills | Status: DC

## 2021-10-18 MED ORDER — AMOXICILLIN-POT CLAVULANATE 875-125 MG PO TABS: 1.0000 | ORAL_TABLET | Freq: Two times a day (BID) | ORAL | 0 refills | Status: DC

## 2021-10-18 NOTE — Patient Instructions (Addendum)
[name] -   Doristine Devoid to see you  Some progress so far, we'll keep pushing. Stop [place]  Add trulicity once weekly. This will help protect heart and kidneys. Looks like it should be covered without a prior auth. Call if too expensive.  See you in 3 mo to recheck. Continue healthy diet and regular exercise.  Augmentin twice daily for ten days. Flonase twice daily as needed. Return if worsening or failing to improve.   Thank you  Rich

## 2021-10-18 NOTE — Progress Notes (Signed)
Established Patient Office Visit  Subjective:  Patient ID: John Mullins, male    DOB: 1971/05/26  Age: 50 y.o. MRN: [id number]  CC:  Chief Complaint  Patient presents with   Follow-up    Patient states he is here to follow up on diabetes and he also has had a cold for a week. Headaches , congestion , runny nose , and coughing up phlegm. He has took one OTC medication with mild relief.    HPI MONTFORD BARG presents for t2dm  Last A1c:  Lab Results  Component Value Date   HGBA1C 14.1 (A) 09/06/2021    Currently taking: metformin 500mg  XR 2 tabs with breakfast 1 tab with dinner, januvia 100mg  po qd.  No new complications Reports good compliance with medications - though Celesta Gentile makes him very nauseous Diet has been improved since last visit - cut down a lot on soft drinks. Continues to lose weihgt Exercise habits have been improved since last visit.  Notes head cold Sinus pressure and pain onset 7+ days ago Pnd, cough. No home covid test No sick contacts No systemic or lower respiratory symptoms  OTCs for symptoms with limited relief   Past Medical History:  Diagnosis Date   Anxiety    Diabetes mellitus without complication ([place])    GERD (gastroesophageal reflux disease)    Hypertension    Sleep apnea     Past Surgical History:  Procedure Laterality Date   ARTHOSCOPIC ROTAOR CUFF REPAIR Right 10/03/2019   Procedure: ARTHROSCOPIC ROTATOR CUFF REPAIR;  Surgeon: Tania Ade, MD;  Location: [facility];  Service: Orthopedics;  Laterality: Right;   FRACTURE SURGERY Right 1990s   SHOULDER ARTHROSCOPY WITH SUBACROMIAL DECOMPRESSION Right 10/03/2019   Procedure: SHOULDER ARTHROSCOPY WITH SUBACROMIAL DECOMPRESSION;  Surgeon: Tania Ade, MD;  Location: [facility];  Service: Orthopedics;  Laterality: Right;    Family History  Problem Relation Age of Onset   Heart disease Father    Stroke Father    Diabetes Father     Social History   Socioeconomic History    Marital status: Married    Spouse name: Not on file   Number of children: 1   Years of education: Not on file   Highest education level: Not on file  Occupational History   Not on file  Tobacco Use   Smoking status: Never   Smokeless tobacco: Never  Vaping Use   Vaping Use: Never used  Substance and Sexual Activity   Alcohol use: No   Drug use: No   Sexual activity: Yes  Other Topics Concern   Not on file  Social History Narrative   Not on file   Social Determinants of Health   Financial Resource Strain: Not on file  Food Insecurity: Not on file  Transportation Needs: Not on file  Physical Activity: Not on file  Stress: Not on file  Social Connections: Not on file  Intimate Partner Violence: Not on file    Outpatient Medications Prior to Visit  Medication Sig Dispense Refill   nystatin (MYCOSTATIN) 100000 UNIT/ML suspension Take 5 mLs (500,000 Units total) by mouth 4 (four) times daily. 60 mL 0   omeprazole (PRILOSEC) 40 MG capsule Take 1 capsule (40 mg total) by mouth daily. 30 capsule 0   ondansetron (ZOFRAN-ODT) 4 MG disintegrating tablet Take 1 tablet (4 mg total) by mouth every 8 (eight) hours as needed for nausea or vomiting. 20 tablet 0   lisinopril-hydrochlorothiazide (ZESTORETIC) 10-12.5 MG tablet Take 1 tablet  by mouth daily. 30 tablet 0   metFORMIN (GLUCOPHAGE-XR) 500 MG 24 hr tablet TAKE 2 TABLETS BY MOUTH ONCE DAILY WITH BREAKFAST AND 1 TABLET WITH DINNER 270 tablet 0   sertraline (ZOLOFT) 100 MG tablet Take 1 tablet (100 mg total) by mouth daily. 30 tablet 0   sitaGLIPtin (JANUVIA) 100 MG tablet Take 1 tablet (100 mg total) by mouth daily. 90 tablet 0   rosuvastatin (CRESTOR) 20 MG tablet Take 1 tablet (20 mg total) by mouth daily. (Patient not taking: Reported on 10/18/2021) 90 tablet 0   dicyclomine (BENTYL) 10 MG capsule Take 1 capsule (10 mg total) by mouth 3 (three) times daily before meals. (Patient not taking: Reported on 10/18/2021) 90 capsule 0    tamsulosin (FLOMAX) 0.4 MG CAPS capsule Take 1 capsule (0.4 mg total) by mouth daily. (Patient not taking: Reported on 10/18/2021) 30 capsule 3   No facility-administered medications prior to visit.    No Known Allergies  ROS Review of Systems  Constitutional: Negative.   HENT: Negative.    Eyes: Negative.   Respiratory: Negative.    Cardiovascular: Negative.   Gastrointestinal: Negative.   Genitourinary: Negative.   Musculoskeletal: Negative.   Skin: Negative.   Neurological: Negative.   Psychiatric/Behavioral: Negative.    All other systems reviewed and are negative.    Objective:    Physical Exam Constitutional:      General: He is not in acute distress.    Appearance: Normal appearance. He is normal weight. He is not ill-appearing, toxic-appearing or diaphoretic.  HENT:     Right Ear: External ear normal.     Left Ear: External ear normal.     Nose: Congestion and rhinorrhea present.     Mouth/Throat:     Mouth: Mucous membranes are moist.     Pharynx: Oropharynx is clear. No oropharyngeal exudate.  Cardiovascular:     Rate and Rhythm: Normal rate and regular rhythm.     Heart sounds: Normal heart sounds. No murmur heard.   No friction rub. No gallop.  Pulmonary:     Effort: Pulmonary effort is normal. No respiratory distress.     Breath sounds: No stridor. Wheezing present. No rhonchi or rales.  Chest:     Chest wall: No tenderness.  Neurological:     General: No focal deficit present.     Mental Status: He is alert and oriented to person, place, and time. Mental status is at baseline.  Psychiatric:        Mood and Affect: Mood normal.        Behavior: Behavior normal.        Thought Content: Thought content normal.        Judgment: Judgment normal.    There were no vitals taken for this visit. Wt Readings from Last 3 Encounters:  09/06/21 (!) 338 lb 9.6 oz (153.6 kg)  08/13/21 (!) 360 lb (163.3 kg)  10/09/20 (!) 368 lb (166.9 kg)     Health  Maintenance Due  Topic Date Due   Pneumococcal Vaccine 40-48 Years old (1 - PCV) Never done   FOOT EXAM  Never done   Zoster Vaccines- Shingrix (1 of 2) Never done    There are no preventive care reminders to display for this patient.  Lab Results  Component Value Date   TSH 2.418 06/10/2015   Lab Results  Component Value Date   WBC 17.3 (H) 11/23/2019   HGB 12.8 (L) 11/23/2019   HCT 40.3 11/23/2019  MCV 83.8 11/23/2019   PLT 422 (H) 11/23/2019   Lab Results  Component Value Date   NA 133 (L) 09/06/2021   K 4.0 09/06/2021   CO2 21 09/06/2021   GLUCOSE 395 (H) 09/06/2021   BUN 17 09/06/2021   CREATININE 0.91 09/06/2021   BILITOT 0.6 10/09/2020   ALKPHOS 108 10/09/2020   AST 22 10/09/2020   ALT 42 10/09/2020   PROT 7.3 10/09/2020   ALBUMIN 4.2 10/09/2020   CALCIUM 10.2 09/06/2021   ANIONGAP 8 11/23/2019   GFR 97.76 09/06/2021   Lab Results  Component Value Date   CHOL 243 (H) 09/06/2021   Lab Results  Component Value Date   HDL 32.70 (L) 09/06/2021   Lab Results  Component Value Date   LDLCALC 107 (H) 05/10/2019   Lab Results  Component Value Date   TRIG 333.0 (H) 09/06/2021   Lab Results  Component Value Date   CHOLHDL 7 09/06/2021   Lab Results  Component Value Date   HGBA1C 14.1 (A) 09/06/2021      Assessment & Plan:   Problem List Items Addressed This Visit       Other   Anxiety state - Primary   Relevant Medications   sertraline (ZOLOFT) 100 MG tablet   Other Visit Diagnoses     Prediabetes       Relevant Medications   metFORMIN (GLUCOPHAGE-XR) 500 MG 24 hr tablet   Other Relevant Orders   POCT HgB A1C   Essential hypertension       Relevant Medications   lisinopril-hydrochlorothiazide (ZESTORETIC) 10-12.5 MG tablet   Uncontrolled type 2 diabetes mellitus with hyperglycemia ([facility])       Relevant Medications   metFORMIN (GLUCOPHAGE-XR) 500 MG 24 hr tablet   lisinopril-hydrochlorothiazide (ZESTORETIC) 10-12.5 MG tablet    sitaGLIPtin (JANUVIA) 100 MG tablet   Other Relevant Orders   POCT HgB A1C       Meds ordered this encounter  Medications   metFORMIN (GLUCOPHAGE-XR) 500 MG 24 hr tablet    Sig: TAKE 2 TABLETS BY MOUTH ONCE DAILY WITH BREAKFAST AND 1 TABLET WITH DINNER    Dispense:  270 tablet    Refill:  0    Order Specific Question:   Supervising Provider    Answer:   Carlota Raspberry, JEFFREY R [[id number]]   lisinopril-hydrochlorothiazide (ZESTORETIC) 10-12.5 MG tablet    Sig: Take 1 tablet by mouth daily.    Dispense:  90 tablet    Refill:  0    Order Specific Question:   Supervising Provider    Answer:   Carlota Raspberry, JEFFREY R [[id number]]   sitaGLIPtin (JANUVIA) 100 MG tablet    Sig: Take 1 tablet (100 mg total) by mouth daily.    Dispense:  90 tablet    Refill:  0    Order Specific Question:   Supervising Provider    Answer:   Carlota Raspberry, JEFFREY R [[id number]]   sertraline (ZOLOFT) 100 MG tablet    Sig: Take 1 tablet (100 mg total) by mouth daily.    Dispense:  90 tablet    Refill:  0    Order Specific Question:   Supervising Provider    Answer:   Carlota Raspberry, JEFFREY R [[id number]]    Follow-up: Return in about 3 months (around 01/16/2022) for t2dm.   PLAN A1c today is at 11.3, improved from 14.1 Stop januvia on account of nausea. Plan to add trulicity 0.75mg  subq weekly with increase to 1.5mg  subq weekly after 1 mo if  tolerating well Recheck in 3 mo Patient encouraged to call clinic with any questions, comments, or concerns.  Maximiano Coss, NP

## 2021-11-03 ENCOUNTER — Other Ambulatory Visit: Payer: Self-pay | Admitting: Registered Nurse

## 2021-11-03 DIAGNOSIS — E1165 Type 2 diabetes mellitus with hyperglycemia: Secondary | ICD-10-CM

## 2021-11-04 DIAGNOSIS — L03032 Cellulitis of left toe: Secondary | ICD-10-CM | POA: Insufficient documentation

## 2021-11-04 DIAGNOSIS — E1165 Type 2 diabetes mellitus with hyperglycemia: Secondary | ICD-10-CM | POA: Diagnosis present

## 2021-11-04 HISTORY — DX: Cellulitis of left toe: L03.032

## 2021-11-08 ENCOUNTER — Telehealth: Payer: Self-pay | Admitting: Registered Nurse

## 2021-11-08 NOTE — Telephone Encounter (Signed)
I am currently starting on the PA on cover my meds.

## 2021-11-08 NOTE — Telephone Encounter (Signed)
Patient PA has been completed and waiting on determination.

## 2021-11-08 NOTE — Telephone Encounter (Signed)
Pt's wife called in stating that the Trulicity needs a PA for 2023. Please advise pt is out of the medication.  [facility] in [place] [address]   Please advise    Insurance did not change for 2023

## 2021-11-09 ENCOUNTER — Telehealth: Payer: Self-pay

## 2021-11-09 NOTE — Telephone Encounter (Signed)
Called pharmacy and they stated he does not need PA and Rx is ready for pick up

## 2021-11-09 NOTE — Telephone Encounter (Signed)
Attempted PA received this notice.  "An active PA is already on file with expiration date of 10/19/2022. Please wait to resubmit request within 60 days of that expiration date to obtain a PA renewal."

## 2021-11-09 NOTE — Telephone Encounter (Signed)
Caller name:Amaan Skeet Simmer  On DPR? :Yes  Call back number:[phone]  Provider they see: Richard   Reason for call:Pt needs pro authorization for TRULICITY 5.85 ID/7.8EU SOPN  Pt is confused did the MG change on the Trulicity pharmacy is saying he was higher does last month? Pharmacy saying they have conflicting dosage?  [place], [address]  [place], [place] [id number]

## 2021-12-07 ENCOUNTER — Telehealth: Payer: Self-pay | Admitting: Registered Nurse

## 2021-12-07 DIAGNOSIS — E1165 Type 2 diabetes mellitus with hyperglycemia: Secondary | ICD-10-CM

## 2021-12-09 ENCOUNTER — Other Ambulatory Visit: Payer: Self-pay | Admitting: Registered Nurse

## 2021-12-09 DIAGNOSIS — E1165 Type 2 diabetes mellitus with hyperglycemia: Secondary | ICD-10-CM

## 2021-12-09 NOTE — Telephone Encounter (Signed)
Called patient today and let him know that the medication is ready for pick up at the pharmacy.

## 2021-12-09 NOTE — Telephone Encounter (Signed)
Pt is calling back about refill sent on 02/07

## 2021-12-24 ENCOUNTER — Other Ambulatory Visit: Payer: Self-pay | Admitting: Registered Nurse

## 2021-12-24 DIAGNOSIS — E1165 Type 2 diabetes mellitus with hyperglycemia: Secondary | ICD-10-CM

## 2021-12-27 ENCOUNTER — Other Ambulatory Visit: Payer: Self-pay | Admitting: Registered Nurse

## 2021-12-27 DIAGNOSIS — E1165 Type 2 diabetes mellitus with hyperglycemia: Secondary | ICD-10-CM

## 2021-12-27 MED ORDER — DULAGLUTIDE 3 MG/0.5ML ~~LOC~~ SOAJ: 3.0000 mg | SUBCUTANEOUS | 0 refills | Status: DC

## 2021-12-27 NOTE — Telephone Encounter (Signed)
Patient is requesting a refill of the following medications: Requested Prescriptions   Pending Prescriptions Disp Refills   TRULICITY 1.5 VQ/2.5ZD SOPN [Pharmacy Med Name: Trulicity 1.5 GL/8.7FI Subcutaneous Solution Pen-injector] 4 mL 0    Sig: INJECT 1.5 MG INTO THE SKIN ONCE A WEEK   Which dosage of Trulicity is the patient suppose to be currently on at this time? Please advise

## 2022-01-02 ENCOUNTER — Other Ambulatory Visit: Payer: Self-pay | Admitting: Registered Nurse

## 2022-01-02 DIAGNOSIS — E1165 Type 2 diabetes mellitus with hyperglycemia: Secondary | ICD-10-CM

## 2022-01-03 ENCOUNTER — Other Ambulatory Visit: Payer: Self-pay | Admitting: Registered Nurse

## 2022-01-03 DIAGNOSIS — E1165 Type 2 diabetes mellitus with hyperglycemia: Secondary | ICD-10-CM

## 2022-01-04 ENCOUNTER — Other Ambulatory Visit: Payer: Self-pay | Admitting: Registered Nurse

## 2022-01-04 ENCOUNTER — Telehealth: Payer: Self-pay | Admitting: Registered Nurse

## 2022-01-04 DIAGNOSIS — E1165 Type 2 diabetes mellitus with hyperglycemia: Secondary | ICD-10-CM

## 2022-01-04 MED ORDER — DULAGLUTIDE 1.5 MG/0.5ML ~~LOC~~ SOAJ: 1.5000 mg | SUBCUTANEOUS | 0 refills | Status: DC

## 2022-01-04 NOTE — Telephone Encounter (Signed)
Pt's wife called in stating that the Trulicity is on back order for the '3MG'$ . She states that all pharmacies in their area are out of the medication. Pt is due this week for his injection. They did not pick up the '3mg'$  on 12/27/21 they were given the 1.'5mg'$ . ? ?Please advise  ? ? ?

## 2022-01-04 NOTE — Telephone Encounter (Signed)
Sent Thanks Rich

## 2022-01-04 NOTE — Telephone Encounter (Signed)
Could you send in equivalent dosing of 1.5?  ?

## 2022-01-06 NOTE — Telephone Encounter (Signed)
Pt called back the Trulicity is not in stock at her pharmacy and wants to know if Ozempic '2mg'$  this is in stock [facility] in [place] pt is due for the injection yesterday  ?

## 2022-01-06 NOTE — Telephone Encounter (Signed)
Patient's wife called and states that Trulicity is no longer available at the pharmacies she has checked in on. Pharmacist suggested they try another medication. She also states that on the [employer] they are reporting issues with manufacturing this product and are advising requesting a change. For any further questions or to notify of changes please call [facility] at the number below.  ? ?Call back #: [phone] ?

## 2022-01-07 ENCOUNTER — Other Ambulatory Visit (HOSPITAL_BASED_OUTPATIENT_CLINIC_OR_DEPARTMENT_OTHER): Payer: Self-pay

## 2022-01-07 MED ORDER — TRULICITY 3 MG/0.5ML ~~LOC~~ SOAJ
SUBCUTANEOUS | 0 refills | Status: DC
  Filled 2022-01-07: qty 2, 28d supply, fill #0

## 2022-01-07 NOTE — Telephone Encounter (Signed)
Pt notes trulicity is out of stock and wants ozempic in its place. Please advise  ?

## 2022-01-11 ENCOUNTER — Other Ambulatory Visit: Payer: Self-pay | Admitting: Registered Nurse

## 2022-01-11 DIAGNOSIS — E1165 Type 2 diabetes mellitus with hyperglycemia: Secondary | ICD-10-CM

## 2022-01-11 MED ORDER — SEMAGLUTIDE (2 MG/DOSE) 8 MG/3ML ~~LOC~~ SOPN: 2.0000 mg | PEN_INJECTOR | SUBCUTANEOUS | 0 refills | Status: DC

## 2022-01-11 NOTE — Telephone Encounter (Signed)
Sent ? ?Thanks, ? ?Rich

## 2022-01-11 NOTE — Progress Notes (Signed)
Sending Ozempic in place of trulicity due to trulicity out of stock ?

## 2022-01-27 ENCOUNTER — Other Ambulatory Visit: Payer: Self-pay

## 2022-01-27 ENCOUNTER — Encounter: Payer: Self-pay | Admitting: Registered Nurse

## 2022-01-27 ENCOUNTER — Ambulatory Visit: Payer: Managed Care, Other (non HMO) | Admitting: Registered Nurse

## 2022-01-27 VITALS — BP 119/82 | HR 101 | Temp 98.2°F | Resp 18 | Ht 72.0 in | Wt 332.0 lb

## 2022-01-27 DIAGNOSIS — E119 Type 2 diabetes mellitus without complications: Secondary | ICD-10-CM

## 2022-01-27 DIAGNOSIS — F411 Generalized anxiety disorder: Secondary | ICD-10-CM | POA: Diagnosis not present

## 2022-01-27 DIAGNOSIS — I152 Hypertension secondary to endocrine disorders: Secondary | ICD-10-CM

## 2022-01-27 DIAGNOSIS — E1159 Type 2 diabetes mellitus with other circulatory complications: Secondary | ICD-10-CM | POA: Diagnosis not present

## 2022-01-27 DIAGNOSIS — K219 Gastro-esophageal reflux disease without esophagitis: Secondary | ICD-10-CM

## 2022-01-27 LAB — POCT GLYCOSYLATED HEMOGLOBIN (HGB A1C): Hemoglobin A1C: 6.4 % — AB (ref 4.0–5.6)

## 2022-01-27 MED ORDER — OMEPRAZOLE 40 MG PO CPDR: 40.0000 mg | DELAYED_RELEASE_CAPSULE | Freq: Every day | ORAL | 0 refills | Status: DC

## 2022-01-27 MED ORDER — ROSUVASTATIN CALCIUM 20 MG PO TABS: 20.0000 mg | ORAL_TABLET | Freq: Every day | ORAL | 0 refills | Status: DC

## 2022-01-27 MED ORDER — LISINOPRIL-HYDROCHLOROTHIAZIDE 10-12.5 MG PO TABS: 1.0000 | ORAL_TABLET | Freq: Every day | ORAL | 0 refills | Status: DC

## 2022-01-27 MED ORDER — DULAGLUTIDE 3 MG/0.5ML ~~LOC~~ SOAJ: 3.0000 mg | SUBCUTANEOUS | 0 refills | Status: DC

## 2022-01-27 MED ORDER — SERTRALINE HCL 100 MG PO TABS: 100.0000 mg | ORAL_TABLET | Freq: Every day | ORAL | 0 refills | Status: DC

## 2022-01-27 NOTE — Progress Notes (Signed)
? ?Established Patient Office Visit ? ?Subjective:  ?Patient ID: John Mullins, male    DOB: 1971/03/13  Age: 51 y.o. MRN: [id number] ? ?CC:  ?Chief Complaint  ?Patient presents with  ? Follow-up  ?  Patient is here for follow up on his Diabetes. Patient has some questions about Trulicity.  ? ? ?HPI ?John Mullins presents for t2dm ? ?Last A1c:  ?Lab Results  ?Component Value Date  ? HGBA1C 6.4 (A) 01/27/2022  ?  ?Currently taking: trulicity '3mg'$  subq weekly, metformin '500mg'$  po ER bid ac. ?No new complications ?Reports good compliance with medications ?Diet has been improved from previously ?Exercise habits have been improved from previously.  ? ?Has questions regarding trulicity.  ?Felt sick for 1-2 days after dose increase to '3mg'$  subq weekly ?Has resolved ?No other symptoms with subsequent injections. ? ?Outpatient Medications Prior to Visit  ?Medication Sig Dispense Refill  ? Dulaglutide 1.5 MG/0.5ML SOPN Inject 1.5 mg into the skin once a week. 2 mL 0  ? fluticasone (FLONASE) 50 MCG/ACT nasal spray Place 2 sprays into both nostrils daily. 16 g 6  ? metFORMIN (GLUCOPHAGE-XR) 500 MG 24 hr tablet TAKE 2 TABLETS BY MOUTH ONCE DAILY WITH BREAKFAST AND 1 TABLET WITH DINNER 270 tablet 0  ? nystatin (MYCOSTATIN) 100000 UNIT/ML suspension Take 5 mLs (500,000 Units total) by mouth 4 (four) times daily. 60 mL 0  ? ondansetron (ZOFRAN-ODT) 4 MG disintegrating tablet Take 1 tablet (4 mg total) by mouth every 8 (eight) hours as needed for nausea or vomiting. 20 tablet 0  ? amoxicillin-clavulanate (AUGMENTIN) 875-125 MG tablet Take 1 tablet by mouth 2 (two) times daily. 20 tablet 0  ? Dulaglutide (TRULICITY) 3 FI/4.3PI SOPN Inject '3mg'$  under the skin once weekly 2 mL 0  ? Dulaglutide 3 MG/0.5ML SOPN Inject 3 mg into the skin once a week. 6 mL 0  ? lisinopril-hydrochlorothiazide (ZESTORETIC) 10-12.5 MG tablet Take 1 tablet by mouth daily. 90 tablet 0  ? omeprazole (PRILOSEC) 40 MG capsule Take 1 capsule (40 mg total) by mouth  daily. 30 capsule 0  ? Semaglutide, 2 MG/DOSE, 8 MG/3ML SOPN Inject 2 mg as directed once a week. 9 mL 0  ? sertraline (ZOLOFT) 100 MG tablet Take 1 tablet (100 mg total) by mouth daily. 90 tablet 0  ? rosuvastatin (CRESTOR) 20 MG tablet Take 1 tablet (20 mg total) by mouth daily. (Patient not taking: Reported on 10/18/2021) 90 tablet 0  ? ?No facility-administered medications prior to visit.  ? ? ?Review of Systems  ?Constitutional: Negative.   ?HENT: Negative.    ?Eyes: Negative.   ?Respiratory: Negative.    ?Cardiovascular: Negative.   ?Gastrointestinal: Negative.   ?Genitourinary: Negative.   ?Musculoskeletal: Negative.   ?Skin: Negative.   ?Neurological: Negative.   ?Psychiatric/Behavioral: Negative.    ?All other systems reviewed and are negative. ? ?  ?Objective:  ?  ? ?BP 119/82   Pulse (!) 101   Temp 98.2 ?F (36.8 ?C) (Temporal)   Resp 18   Ht 6' (1.829 m)   Wt (!) 332 lb (150.6 kg)   SpO2 96%   BMI 45.03 kg/m?  ? ?Wt Readings from Last 3 Encounters:  ?01/27/22 (!) 332 lb (150.6 kg)  ?10/18/21 (!) 320 lb (145.2 kg)  ?09/06/21 (!) 338 lb 9.6 oz (153.6 kg)  ? ?Physical Exam ?Constitutional:   ?   General: He is not in acute distress. ?   Appearance: Normal appearance. He is obese. He is not  ill-appearing, toxic-appearing or diaphoretic.  ?Cardiovascular:  ?   Rate and Rhythm: Normal rate and regular rhythm.  ?   Heart sounds: Normal heart sounds. No murmur heard. ?  No friction rub. No gallop.  ?Pulmonary:  ?   Effort: Pulmonary effort is normal. No respiratory distress.  ?   Breath sounds: Normal breath sounds. No stridor. No wheezing, rhonchi or rales.  ?Chest:  ?   Chest wall: No tenderness.  ?Neurological:  ?   General: No focal deficit present.  ?   Mental Status: He is alert and oriented to person, place, and time. Mental status is at baseline.  ?Psychiatric:     ?   Mood and Affect: Mood normal.     ?   Behavior: Behavior normal.     ?   Thought Content: Thought content normal.     ?   Judgment:  Judgment normal.  ? ? ?Results for orders placed or performed in visit on 01/27/22  ?POCT glycosylated hemoglobin (Hb A1C)  ?Result Value Ref Range  ? Hemoglobin A1C 6.4 (A) 4.0 - 5.6 %  ? HbA1c POC (<> result, manual entry)    ? HbA1c, POC (prediabetic range)    ? HbA1c, POC (controlled diabetic range)    ? ? ? ? ?The 10-year ASCVD risk score (Arnett DK, et al., 2019) is: 14.6% ? ?  ?Assessment & Plan:  ? ?Problem List Items Addressed This Visit   ? ?  ? Cardiovascular and Mediastinum  ? Hypertension associated with diabetes ([place])  ? Relevant Medications  ? Dulaglutide 3 MG/0.5ML SOPN  ? lisinopril-hydrochlorothiazide (ZESTORETIC) 10-12.5 MG tablet  ? rosuvastatin (CRESTOR) 20 MG tablet  ?  ? Digestive  ? GERD (gastroesophageal reflux disease)  ? Relevant Medications  ? omeprazole (PRILOSEC) 40 MG capsule  ?  ? Endocrine  ? Well controlled type 2 diabetes mellitus ([place]) - Primary  ?  Great improvement on trulicity. Continue. Recheck in 6 mo with CPE and labs ?  ?  ? Relevant Medications  ? Dulaglutide 3 MG/0.5ML SOPN  ? lisinopril-hydrochlorothiazide (ZESTORETIC) 10-12.5 MG tablet  ? rosuvastatin (CRESTOR) 20 MG tablet  ? Other Relevant Orders  ? POCT glycosylated hemoglobin (Hb A1C) (Completed)  ?  ? Other  ? Anxiety state  ? Relevant Medications  ? sertraline (ZOLOFT) 100 MG tablet  ? ? ?Meds ordered this encounter  ?Medications  ? Dulaglutide 3 MG/0.5ML SOPN  ?  Sig: Inject 3 mg into the skin once a week.  ?  Dispense:  6 mL  ?  Refill:  0  ?  Order Specific Question:   Supervising Provider  ?  Answer:   Carlota Raspberry, JEFFREY R [[id number]]  ? lisinopril-hydrochlorothiazide (ZESTORETIC) 10-12.5 MG tablet  ?  Sig: Take 1 tablet by mouth daily.  ?  Dispense:  90 tablet  ?  Refill:  0  ?  Order Specific Question:   Supervising Provider  ?  Answer:   Carlota Raspberry, JEFFREY R [[id number]]  ? sertraline (ZOLOFT) 100 MG tablet  ?  Sig: Take 1 tablet (100 mg total) by mouth daily.  ?  Dispense:  90 tablet  ?  Refill:  0  ?  Order Specific  Question:   Supervising Provider  ?  Answer:   Carlota Raspberry, JEFFREY R [[id number]]  ? rosuvastatin (CRESTOR) 20 MG tablet  ?  Sig: Take 1 tablet (20 mg total) by mouth daily.  ?  Dispense:  90 tablet  ?  Refill:  0  ?  Order Specific Question:   Supervising Provider  ?  Answer:   Carlota Raspberry, JEFFREY R [[id number]]  ? omeprazole (PRILOSEC) 40 MG capsule  ?  Sig: Take 1 capsule (40 mg total) by mouth daily.  ?  Dispense:  30 capsule  ?  Refill:  0  ?  Order Specific Question:   Supervising Provider  ?  Answer:   Carlota Raspberry, JEFFREY R [[id number]]  ? ? ?Return in about 6 weeks (around 03/10/2022) for CPE and labs.  ? ? ?Maximiano Coss, NP ?

## 2022-01-27 NOTE — Patient Instructions (Addendum)
?  Mr. Craighead - ? ?GREAT WORK. ? ?Keep it up. Sugars are looking great. ? ?See you in 3-6 mo for physical, labs, refills. ? ?Call sooner with concerns ? ?Thanks, ? ?Rich  ? ? ?If you have lab work done today you will be contacted with your lab results within the next 2 weeks.  If you have not heard from [place] then please contact us. The fastest way to get your results is to register for My Chart. ? ? ?IF you received an x-ray today, you will receive an invoice from [facility]. Please contact [facility] at [phone] with questions or concerns regarding your invoice.  ? ?IF you received labwork today, you will receive an invoice from [facility]. Please contact [facility] at [phone] with questions or concerns regarding your invoice.  ? ?Our billing staff will not be able to assist you with questions regarding bills from these companies. ? ?You will be contacted with the lab results as soon as they are available. The fastest way to get your results is to activate your My Chart account. Instructions are located on the last page of this paperwork. If you have not heard from [place] regarding the results in 2 weeks, please contact this office. ?  ? ? ?

## 2022-01-27 NOTE — Assessment & Plan Note (Signed)
Great improvement on trulicity. Continue. Recheck in 6 mo with CPE and labs ?

## 2022-01-28 ENCOUNTER — Telehealth: Payer: Self-pay

## 2022-01-28 NOTE — Telephone Encounter (Signed)
Patient was given trulicity and a PA was on Covermymeds and I tried to fill it out. At the end it shows that the patient has a active PA until 10/19/2022 for this medication. ?

## 2022-02-11 ENCOUNTER — Ambulatory Visit: Payer: Managed Care, Other (non HMO) | Admitting: Registered Nurse

## 2022-03-01 ENCOUNTER — Other Ambulatory Visit: Payer: Self-pay | Admitting: Registered Nurse

## 2022-03-01 DIAGNOSIS — E119 Type 2 diabetes mellitus without complications: Secondary | ICD-10-CM

## 2022-03-10 ENCOUNTER — Encounter: Payer: Managed Care, Other (non HMO) | Admitting: Registered Nurse

## 2022-03-28 ENCOUNTER — Other Ambulatory Visit: Payer: Self-pay | Admitting: Registered Nurse

## 2022-03-28 DIAGNOSIS — E119 Type 2 diabetes mellitus without complications: Secondary | ICD-10-CM

## 2022-04-08 ENCOUNTER — Encounter: Payer: Self-pay | Admitting: Family Medicine

## 2022-04-08 ENCOUNTER — Telehealth: Payer: Self-pay | Admitting: Registered Nurse

## 2022-04-08 NOTE — Telephone Encounter (Signed)
Patient would need a appointment

## 2022-04-08 NOTE — Telephone Encounter (Signed)
PT wife stats that, her husband has a bad sinus infection. Pt wants antibiotics. Pt has a appt  with tabori 04/11/22. He want to know if he can get meds before appt.

## 2022-04-11 ENCOUNTER — Ambulatory Visit: Payer: Managed Care, Other (non HMO) | Admitting: Family Medicine

## 2022-05-02 ENCOUNTER — Telehealth: Payer: Self-pay | Admitting: Registered Nurse

## 2022-05-02 ENCOUNTER — Other Ambulatory Visit: Payer: Self-pay | Admitting: Registered Nurse

## 2022-05-02 ENCOUNTER — Other Ambulatory Visit: Payer: Self-pay

## 2022-05-02 DIAGNOSIS — E119 Type 2 diabetes mellitus without complications: Secondary | ICD-10-CM

## 2022-05-02 MED ORDER — TRULICITY 3 MG/0.5ML ~~LOC~~ SOAJ: SUBCUTANEOUS | 0 refills | Status: DC

## 2022-05-02 NOTE — Telephone Encounter (Signed)
[facility] called requesting a refill of trulicity 3 mg.  [phone]

## 2022-05-02 NOTE — Telephone Encounter (Signed)
Sent in the '3mg'$ 

## 2022-05-04 ENCOUNTER — Other Ambulatory Visit: Payer: Self-pay | Admitting: Registered Nurse

## 2022-05-04 DIAGNOSIS — F411 Generalized anxiety disorder: Secondary | ICD-10-CM

## 2022-05-04 DIAGNOSIS — E1159 Type 2 diabetes mellitus with other circulatory complications: Secondary | ICD-10-CM

## 2022-05-06 ENCOUNTER — Encounter: Payer: Self-pay | Admitting: Registered Nurse

## 2022-05-06 ENCOUNTER — Telehealth: Payer: Self-pay | Admitting: Registered Nurse

## 2022-05-06 NOTE — Telephone Encounter (Signed)
Pt has appt on 05/18/22. Pt called stating he need a refill on his medications sertraline 100 mg and lisinopril-hydrochlorothiazide 10-12.5 mg. Pt want his refill to be sent to [facility] on [address]

## 2022-05-06 NOTE — Telephone Encounter (Signed)
Reviewed chart and see that refills are done on 05/04/22 to requested pharmacy, LM asking him to check with them about refill

## 2022-05-18 ENCOUNTER — Ambulatory Visit: Payer: Managed Care, Other (non HMO) | Admitting: Registered Nurse

## 2022-05-18 ENCOUNTER — Encounter: Payer: Self-pay | Admitting: Registered Nurse

## 2022-05-18 VITALS — BP 114/82 | HR 72 | Temp 98.2°F | Resp 18 | Ht 72.0 in | Wt 320.6 lb

## 2022-05-18 DIAGNOSIS — R7303 Prediabetes: Secondary | ICD-10-CM

## 2022-05-18 DIAGNOSIS — Z0000 Encounter for general adult medical examination without abnormal findings: Secondary | ICD-10-CM

## 2022-05-18 DIAGNOSIS — E1165 Type 2 diabetes mellitus with hyperglycemia: Secondary | ICD-10-CM

## 2022-05-18 DIAGNOSIS — E1159 Type 2 diabetes mellitus with other circulatory complications: Secondary | ICD-10-CM

## 2022-05-18 DIAGNOSIS — F411 Generalized anxiety disorder: Secondary | ICD-10-CM

## 2022-05-18 DIAGNOSIS — I152 Hypertension secondary to endocrine disorders: Secondary | ICD-10-CM

## 2022-05-18 DIAGNOSIS — E119 Type 2 diabetes mellitus without complications: Secondary | ICD-10-CM

## 2022-05-18 DIAGNOSIS — K219 Gastro-esophageal reflux disease without esophagitis: Secondary | ICD-10-CM

## 2022-05-18 DIAGNOSIS — B9689 Other specified bacterial agents as the cause of diseases classified elsewhere: Secondary | ICD-10-CM

## 2022-05-18 DIAGNOSIS — J019 Acute sinusitis, unspecified: Secondary | ICD-10-CM

## 2022-05-18 LAB — CBC WITH DIFFERENTIAL/PLATELET
Basophils Absolute: 0.1 10*3/uL (ref 0.0–0.1)
Basophils Relative: 0.8 % (ref 0.0–3.0)
Eosinophils Absolute: 0.2 10*3/uL (ref 0.0–0.7)
Eosinophils Relative: 1.7 % (ref 0.0–5.0)
HCT: 40.1 % (ref 39.0–52.0)
Hemoglobin: 13.3 g/dL (ref 13.0–17.0)
Lymphocytes Relative: 23.4 % (ref 12.0–46.0)
Lymphs Abs: 2.9 10*3/uL (ref 0.7–4.0)
MCHC: 33.2 g/dL (ref 30.0–36.0)
MCV: 83.3 fl (ref 78.0–100.0)
Monocytes Absolute: 0.7 10*3/uL (ref 0.1–1.0)
Monocytes Relative: 5.6 % (ref 3.0–12.0)
Neutro Abs: 8.5 10*3/uL — ABNORMAL HIGH (ref 1.4–7.7)
Neutrophils Relative %: 68.5 % (ref 43.0–77.0)
Platelets: 376 10*3/uL (ref 150.0–400.0)
RBC: 4.82 Mil/uL (ref 4.22–5.81)
RDW: 16.3 % — ABNORMAL HIGH (ref 11.5–15.5)
WBC: 12.4 10*3/uL — ABNORMAL HIGH (ref 4.0–10.5)

## 2022-05-18 LAB — COMPREHENSIVE METABOLIC PANEL
ALT: 30 U/L (ref 0–53)
AST: 22 U/L (ref 0–37)
Albumin: 4.5 g/dL (ref 3.5–5.2)
Alkaline Phosphatase: 90 U/L (ref 39–117)
BUN: 14 mg/dL (ref 6–23)
CO2: 25 mEq/L (ref 19–32)
Calcium: 9.7 mg/dL (ref 8.4–10.5)
Chloride: 103 mEq/L (ref 96–112)
Creatinine, Ser: 0.88 mg/dL (ref 0.40–1.50)
GFR: 99.26 mL/min (ref >60.00)
Glucose, Bld: 120 mg/dL — ABNORMAL HIGH (ref 70–99)
Potassium: 4.5 mEq/L (ref 3.5–5.1)
Sodium: 137 mEq/L (ref 135–145)
Total Bilirubin: 0.4 mg/dL (ref 0.2–1.2)
Total Protein: 7.8 g/dL (ref 6.0–8.3)

## 2022-05-18 LAB — LIPID PANEL
Cholesterol: 159 mg/dL (ref 0–200)
HDL: 35.1 mg/dL — ABNORMAL LOW (ref >39.00)
LDL Cholesterol: 102 mg/dL — ABNORMAL HIGH (ref 0–99)
NonHDL: 123.53
Total CHOL/HDL Ratio: 5
Triglycerides: 109 mg/dL (ref 0.0–149.0)
VLDL: 21.8 mg/dL (ref 0.0–40.0)

## 2022-05-18 LAB — HEMOGLOBIN A1C: Hgb A1c MFr Bld: 6.6 % — ABNORMAL HIGH (ref 4.6–6.5)

## 2022-05-18 LAB — TSH: TSH: 3.19 u[IU]/mL (ref 0.35–5.50)

## 2022-05-18 MED ORDER — FLUTICASONE PROPIONATE 50 MCG/ACT NA SUSP: 2.0000 | Freq: Every day | NASAL | 6 refills | Status: DC

## 2022-05-18 MED ORDER — TRULICITY 3 MG/0.5ML ~~LOC~~ SOAJ: SUBCUTANEOUS | 0 refills | Status: DC

## 2022-05-18 MED ORDER — METFORMIN HCL ER 500 MG PO TB24
ORAL_TABLET | ORAL | 0 refills | Status: DC
Start: 2022-05-18 — End: 2022-08-25

## 2022-05-18 MED ORDER — DULAGLUTIDE 1.5 MG/0.5ML ~~LOC~~ SOAJ: 1.5000 mg | SUBCUTANEOUS | 0 refills | Status: DC

## 2022-05-18 MED ORDER — ROSUVASTATIN CALCIUM 20 MG PO TABS: 20.0000 mg | ORAL_TABLET | Freq: Every day | ORAL | 0 refills | Status: DC

## 2022-05-18 MED ORDER — SERTRALINE HCL 100 MG PO TABS: 100.0000 mg | ORAL_TABLET | Freq: Every day | ORAL | 3 refills | Status: DC

## 2022-05-18 MED ORDER — LISINOPRIL-HYDROCHLOROTHIAZIDE 10-12.5 MG PO TABS: 1.0000 | ORAL_TABLET | Freq: Every day | ORAL | 3 refills | Status: DC

## 2022-05-18 MED ORDER — TRULICITY 3 MG/0.5ML ~~LOC~~ SOAJ
3.0000 mg | SUBCUTANEOUS | 3 refills | Status: DC
  Filled 2022-11-15: qty 2, 28d supply, fill #0
  Filled 2022-12-07 – 2022-12-29 (×3): qty 2, 28d supply, fill #1

## 2022-05-18 MED ORDER — OMEPRAZOLE 40 MG PO CPDR
40.0000 mg | DELAYED_RELEASE_CAPSULE | Freq: Every day | ORAL | 0 refills | Status: DC
Start: 2022-05-18 — End: 2022-08-23

## 2022-05-18 NOTE — Assessment & Plan Note (Signed)
He has done well to lose 60lbs since 2021. He plans to continue.

## 2022-05-18 NOTE — Assessment & Plan Note (Signed)
Continue PPI, diet control. Anticipate further weight loss will help

## 2022-05-18 NOTE — Assessment & Plan Note (Signed)
Continue sertraline 

## 2022-05-18 NOTE — Assessment & Plan Note (Signed)
Well controlled. Continue current medication. He will continue to pursue weight loss.

## 2022-05-18 NOTE — Assessment & Plan Note (Signed)
>>  ASSESSMENT AND PLAN FOR ANXIETY STATE WRITTEN ON 05/18/2022  8:01 AM BY Janeece Agee, NP  Continue sertraline

## 2022-05-18 NOTE — Patient Instructions (Addendum)
John Mullins -   I commend you on your great work.  Keep it up!  Check out these providers:  Inda Coke, PA Berniece Pap, MD Myrna Blazer Early, NP Jeralyn Ruths, DNP  Thanks,  [name]    If you have lab work done today you will be contacted with your lab results within the next 2 weeks.  If you have not heard from [place] then please contact us. The fastest way to get your results is to register for My Chart.   IF you received an x-ray today, you will receive an invoice from [facility]. Please contact [facility] at [phone] with questions or concerns regarding your invoice.   IF you received labwork today, you will receive an invoice from [facility]. Please contact [facility] at [phone] with questions or concerns regarding your invoice.   Our billing staff will not be able to assist you with questions regarding bills from these companies.  You will be contacted with the lab results as soon as they are available. The fastest way to get your results is to activate your My Chart account. Instructions are located on the last page of this paperwork. If you have not heard from [place] regarding the results in 2 weeks, please contact this office.

## 2022-05-18 NOTE — Progress Notes (Signed)
Complete physical exam  Patient: John Mullins   DOB: 18-Sep-1971   51 y.o. Male  MRN: [id number] Visit Date: 05/18/2022  Subjective:    Chief Complaint  Patient presents with   Annual Exam    Patient states he is here for a CPE and medication refills    John Mullins is a 51 y.o. male who presents today for a complete physical exam. He reports consuming a general diet. The patient has a physically strenuous job, but has no regular exercise apart from work.  He generally feels well. He reports sleeping well. He does not have additional problems to discuss today.   Vision:Within the last year Dental:Within Last 6 months STD Screen:No PSA:No  T2dm Last A1c:  Lab Results  Component Value Date   HGBA1C 6.4 (A) 01/27/2022    Currently taking: trulicity '3mg'$  subq weekly, metformin XR '1000mg'$  po qd and '500mg'$  po qd No new complications Reports good compliance with medications Diet has been steady Exercise habits have been active work  Hypertension: Patient Currently taking: lisinopril-hctz 10-12.'5mg'$  po qd Good effect. No AEs. Denies CV symptoms including: chest pain, shob, doe, headache, visual changes, fatigue, claudication, and dependent edema.   Previous readings and labs: BP Readings from Last 3 Encounters:  05/18/22 114/82  01/27/22 119/82  10/18/21 119/84   Lab Results  Component Value Date   CREATININE 0.91 09/06/2021   HLD assoc with t2dm, htn Poorly controlled. On rosuvastatin '20mg'$  po qd Good effect, no AE.  Lab Results  Component Value Date   CHOL 243 (H) 09/06/2021   HDL 32.70 (L) 09/06/2021   LDLCALC 107 (H) 05/10/2019   LDLDIRECT 180.0 09/06/2021   TRIG 333.0 (H) 09/06/2021   CHOLHDL 7 09/06/2021      Most recent fall risk assessment:    05/18/2022    7:37 AM  Fall Risk   Falls in the past year? 0  Number falls in past yr: 0  Injury with Fall? 0  Risk for fall due to : No Fall Risks  Follow up Falls evaluation completed     Most recent  depression screenings:    05/18/2022    7:37 AM 01/27/2022    8:21 AM  PHQ 2/9 Scores  PHQ - 2 Score 0 0  PHQ- 9 Score 1 0     Patient Active Problem List   Diagnosis Date Noted   Well controlled type 2 diabetes mellitus ([place]) 01/27/2022   COVID-19 vaccination declined 08/13/2021   Left adrenal mass ([place]) 08/13/2021   Morbid obesity due to excess calories ([place]) 09/01/2015   Hypersomnia with sleep apnea 09/01/2015   Snoring 09/01/2015   Anxiety state 06/04/2013   GERD (gastroesophageal reflux disease) 06/04/2013   Obesity, Class III, BMI 40-49.9 (morbid obesity) ([place]) 06/04/2013   Hypertension associated with diabetes ([place])    HIATAL HERNIA 11/28/2006   Past Medical History:  Diagnosis Date   Anxiety    Diabetes mellitus without complication ([place])    GERD (gastroesophageal reflux disease)    Hypertension    Sleep apnea    Past Surgical History:  Procedure Laterality Date   ARTHOSCOPIC ROTAOR CUFF REPAIR Right 10/03/2019   Procedure: ARTHROSCOPIC ROTATOR CUFF REPAIR;  Surgeon: Tania Ade, MD;  Location: [facility];  Service: Orthopedics;  Laterality: Right;   FRACTURE SURGERY Right 1990s   SHOULDER ARTHROSCOPY WITH SUBACROMIAL DECOMPRESSION Right 10/03/2019   Procedure: SHOULDER ARTHROSCOPY WITH SUBACROMIAL DECOMPRESSION;  Surgeon: Tania Ade, MD;  Location: [facility];  Service:  Orthopedics;  Laterality: Right;   Social History   Tobacco Use   Smoking status: Never   Smokeless tobacco: Never  Vaping Use   Vaping Use: Never used  Substance Use Topics   Alcohol use: No   Drug use: No   Social History   Socioeconomic History   Marital status: Married    Spouse name: Not on file   Number of children: 1   Years of education: Not on file   Highest education level: Not on file  Occupational History   Not on file  Tobacco Use   Smoking status: Never   Smokeless tobacco: Never  Vaping Use   Vaping Use: Never used  Substance and Sexual Activity   Alcohol use:  No   Drug use: No   Sexual activity: Yes  Other Topics Concern   Not on file  Social History Narrative   Not on file   Social Determinants of Health   Financial Resource Strain: Not on file  Food Insecurity: Not on file  Transportation Needs: Not on file  Physical Activity: Not on file  Stress: Not on file  Social Connections: Not on file  Intimate Partner Violence: Not on file   Family Status  Relation Name Status   Father  Deceased   Mother  Alive   Brother  Alive   Daughter  Alive   Family History  Problem Relation Age of Onset   Heart disease Father    Stroke Father    Diabetes Father    No Known Allergies   Patient Care Team: Maximiano Coss, NP as PCP - General (Adult Health Nurse Practitioner)   Medications: Outpatient Medications Prior to Visit  Medication Sig   nystatin (MYCOSTATIN) 100000 UNIT/ML suspension Take 5 mLs (500,000 Units total) by mouth 4 (four) times daily.   ondansetron (ZOFRAN-ODT) 4 MG disintegrating tablet Take 1 tablet (4 mg total) by mouth every 8 (eight) hours as needed for nausea or vomiting.   [DISCONTINUED] Dulaglutide (TRULICITY) 3 XL/2.4MW SOPN INJECT '3MG'$  INTO THE SKIN ONCE A WEEK   [DISCONTINUED] Dulaglutide 1.5 MG/0.5ML SOPN Inject 1.5 mg into the skin once a week.   [DISCONTINUED] fluticasone (FLONASE) 50 MCG/ACT nasal spray Place 2 sprays into both nostrils daily.   [DISCONTINUED] lisinopril-hydrochlorothiazide (ZESTORETIC) 10-12.5 MG tablet Take 1 tablet by mouth once daily   [DISCONTINUED] metFORMIN (GLUCOPHAGE-XR) 500 MG 24 hr tablet TAKE 2 TABLETS BY MOUTH ONCE DAILY WITH BREAKFAST AND 1 TABLET WITH DINNER   [DISCONTINUED] omeprazole (PRILOSEC) 40 MG capsule Take 1 capsule (40 mg total) by mouth daily.   [DISCONTINUED] rosuvastatin (CRESTOR) 20 MG tablet Take 1 tablet (20 mg total) by mouth daily.   [DISCONTINUED] sertraline (ZOLOFT) 100 MG tablet Take 1 tablet by mouth once daily   [DISCONTINUED] TRULICITY 3 NU/2.7OZ SOPN  INJECT '3MG'$  INTO THE SKIN ONCE A WEEK   No facility-administered medications prior to visit.    Review of Systems  Constitutional: Negative.   HENT: Negative.    Eyes: Negative.   Respiratory: Negative.    Cardiovascular: Negative.   Gastrointestinal: Negative.   Genitourinary: Negative.   Musculoskeletal: Negative.   Skin: Negative.   Neurological: Negative.   Psychiatric/Behavioral: Negative.      Last CBC Lab Results  Component Value Date   WBC 17.3 (H) 11/23/2019   HGB 12.8 (L) 11/23/2019   HCT 40.3 11/23/2019   MCV 83.8 11/23/2019   MCH 26.6 11/23/2019   RDW 14.5 11/23/2019   PLT 422 (H) 11/23/2019  Last metabolic panel Lab Results  Component Value Date   GLUCOSE 395 (H) 09/06/2021   NA 133 (L) 09/06/2021   K 4.0 09/06/2021   CL 95 (L) 09/06/2021   CO2 21 09/06/2021   BUN 17 09/06/2021   CREATININE 0.91 09/06/2021   GFRNONAA >60 11/23/2019   CALCIUM 10.2 09/06/2021   PROT 7.3 10/09/2020   ALBUMIN 4.2 10/09/2020   LABGLOB 2.9 01/26/2018   AGRATIO 1.5 01/26/2018   BILITOT 0.6 10/09/2020   ALKPHOS 108 10/09/2020   AST 22 10/09/2020   ALT 42 10/09/2020   ANIONGAP 8 11/23/2019   Last lipids Lab Results  Component Value Date   CHOL 243 (H) 09/06/2021   HDL 32.70 (L) 09/06/2021   LDLCALC 107 (H) 05/10/2019   LDLDIRECT 180.0 09/06/2021   TRIG 333.0 (H) 09/06/2021   CHOLHDL 7 09/06/2021   Last hemoglobin A1c Lab Results  Component Value Date   HGBA1C 6.4 (A) 01/27/2022   Last thyroid functions Lab Results  Component Value Date   TSH 2.418 06/10/2015   Last vitamin D Lab Results  Component Value Date   VD25OH 29.00 (L) 07/05/2019   Last vitamin B12 and Folate No results found for: "VITAMINB12", "FOLATE"      Objective:     BP 114/82   Pulse 72   Temp 98.2 F (36.8 C) (Temporal)   Resp 18   Ht 6' (1.829 m)   Wt (!) 320 lb 9.6 oz (145.4 kg)   SpO2 99%   BMI 43.48 kg/m   BP Readings from Last 3 Encounters:  05/18/22 114/82   01/27/22 119/82  10/18/21 119/84   Wt Readings from Last 3 Encounters:  05/18/22 (!) 320 lb 9.6 oz (145.4 kg)  01/27/22 (!) 332 lb (150.6 kg)  10/18/21 (!) 320 lb (145.2 kg)   SpO2 Readings from Last 3 Encounters:  05/18/22 99%  01/27/22 96%  08/13/21 99%      Physical Exam Vitals and nursing note reviewed.  Constitutional:      General: He is not in acute distress.    Appearance: Normal appearance. He is obese. He is not ill-appearing, toxic-appearing or diaphoretic.  HENT:     Head: Normocephalic and atraumatic.     Right Ear: Tympanic membrane, ear canal and external ear normal. There is no impacted cerumen.     Left Ear: Tympanic membrane, ear canal and external ear normal. There is no impacted cerumen.     Nose: Nose normal. No congestion or rhinorrhea.     Mouth/Throat:     Mouth: Mucous membranes are moist.     Pharynx: Oropharynx is clear. No oropharyngeal exudate or posterior oropharyngeal erythema.  Eyes:     General: No scleral icterus.       Right eye: No discharge.        Left eye: No discharge.     Extraocular Movements: Extraocular movements intact.     Conjunctiva/sclera: Conjunctivae normal.     Pupils: Pupils are equal, round, and reactive to light.  Neck:     Vascular: No carotid bruit.  Cardiovascular:     Rate and Rhythm: Normal rate and regular rhythm.     Pulses: Normal pulses.     Heart sounds: Normal heart sounds. No murmur heard.    No friction rub. No gallop.  Pulmonary:     Effort: Pulmonary effort is normal. No respiratory distress.     Breath sounds: Normal breath sounds. No stridor. No wheezing, rhonchi or rales.  Chest:     Chest wall: No tenderness.  Abdominal:     General: Abdomen is flat. Bowel sounds are normal. There is no distension.     Palpations: Abdomen is soft. There is no mass.     Tenderness: There is no abdominal tenderness. There is no right CVA tenderness, left CVA tenderness, guarding or rebound.     Hernia: No  hernia is present.  Musculoskeletal:        General: No swelling, tenderness, deformity or signs of injury. Normal range of motion.     Cervical back: Normal range of motion and neck supple. No rigidity or tenderness.     Right lower leg: No edema.     Left lower leg: No edema.  Lymphadenopathy:     Cervical: No cervical adenopathy.  Skin:    General: Skin is warm and dry.     Capillary Refill: Capillary refill takes less than 2 seconds.     Coloration: Skin is not jaundiced or pale.     Findings: No bruising, erythema, lesion or rash.  Neurological:     General: No focal deficit present.     Mental Status: He is alert and oriented to person, place, and time. Mental status is at baseline.     Cranial Nerves: No cranial nerve deficit.     Sensory: No sensory deficit.     Motor: No weakness.     Coordination: Coordination normal.     Gait: Gait normal.     Deep Tendon Reflexes: Reflexes normal.  Psychiatric:        Mood and Affect: Mood normal.        Behavior: Behavior normal.        Thought Content: Thought content normal.        Judgment: Judgment normal.      No results found for any visits on 05/18/22.    Assessment & Plan:    Routine Health Maintenance and Physical Exam  Immunization History  Administered Date(s) Administered   Influenza, Seasonal, Injecte, Preservative Fre 10/27/2012   Influenza,inj,Quad PF,6+ Mos 09/21/2016, 08/16/2017, 07/05/2019, 07/03/2020, 08/13/2021   Tdap 01/16/2010, 10/09/2020    Health Maintenance  Topic Date Due   FOOT EXAM  Never done   OPHTHALMOLOGY EXAM  05/18/2022 (Originally 06/06/1980)   COVID-19 Vaccine (1) 06/03/2022 (Originally 12/07/1971)   Zoster Vaccines- Shingrix (1 of 2) 08/18/2022 (Originally 06/06/2020)   INFLUENZA VACCINE  05/31/2022   HEMOGLOBIN A1C  07/30/2022   COLONOSCOPY (Pts 45-45yr Insurance coverage will need to be confirmed)  09/09/2030   TETANUS/TDAP  10/09/2030   Hepatitis C Screening  Completed   HIV  Screening  Completed   HPV VACCINES  Aged Out    Discussed health benefits of physical activity, and encouraged him to engage in regular exercise appropriate for his age and condition.  Problem List Items Addressed This Visit       Cardiovascular and Mediastinum   Hypertension associated with diabetes ([place]    Well controlled. Continue current medication. He will continue to pursue weight loss.       Relevant Medications   Dulaglutide (TRULICITY) 3 MWU/9.8JXSOPN   Dulaglutide 1.5 MG/0.5ML SOPN   lisinopril-hydrochlorothiazide (ZESTORETIC) 10-12.5 MG tablet   metFORMIN (GLUCOPHAGE-XR) 500 MG 24 hr tablet   rosuvastatin (CRESTOR) 20 MG tablet   Dulaglutide (TRULICITY) 3 MBJ/4.7WGSOPN     Digestive   GERD (gastroesophageal reflux disease)    Continue PPI, diet control. Anticipate further weight loss will help  Relevant Medications   omeprazole (PRILOSEC) 40 MG capsule     Endocrine   Well controlled type 2 diabetes mellitus ([place])    Doing very well on trulicity and metformin. Continue. Recheck labs today.       Relevant Medications   Dulaglutide (TRULICITY) 3 VP/7.1GG SOPN   Dulaglutide 1.5 MG/0.5ML SOPN   lisinopril-hydrochlorothiazide (ZESTORETIC) 10-12.5 MG tablet   metFORMIN (GLUCOPHAGE-XR) 500 MG 24 hr tablet   rosuvastatin (CRESTOR) 20 MG tablet   Dulaglutide (TRULICITY) 3 YI/9.4WN SOPN   Other Relevant Orders   CBC with Differential/Platelet   Comprehensive metabolic panel   Hemoglobin A1c   Lipid panel   TSH   Microalbumin / creatinine urine ratio     Other   Anxiety state    Continue sertraline      Relevant Medications   sertraline (ZOLOFT) 100 MG tablet   Morbid obesity due to excess calories ([place])    He has done well to lose 60lbs since 2021. He plans to continue.       Relevant Medications   Dulaglutide (TRULICITY) 3 IO/2.7OJ SOPN   Dulaglutide 1.5 MG/0.5ML SOPN   metFORMIN (GLUCOPHAGE-XR) 500 MG 24 hr tablet   Dulaglutide (TRULICITY) 3  JK/0.9FG SOPN   Other Visit Diagnoses     Annual physical exam    -  Primary   Uncontrolled type 2 diabetes mellitus with hyperglycemia ([facility])       Relevant Medications   Dulaglutide (TRULICITY) 3 HW/2.9HB SOPN   Dulaglutide 1.5 MG/0.5ML SOPN   lisinopril-hydrochlorothiazide (ZESTORETIC) 10-12.5 MG tablet   metFORMIN (GLUCOPHAGE-XR) 500 MG 24 hr tablet   rosuvastatin (CRESTOR) 20 MG tablet   Dulaglutide (TRULICITY) 3 ZJ/6.9CV SOPN   Acute bacterial sinusitis       Relevant Medications   fluticasone (FLONASE) 50 MCG/ACT nasal spray   Prediabetes       Relevant Medications   metFORMIN (GLUCOPHAGE-XR) 500 MG 24 hr tablet      Return in about 6 months (around 11/18/2022) for Chronic Conditions.    PLAN Exam unremarkable See problem based charting. Labs collected. Will follow up with the patient as warranted Patient encouraged to call clinic with any questions, comments, or concerns.   Maximiano Coss, NP

## 2022-05-18 NOTE — Assessment & Plan Note (Signed)
Doing very well on trulicity and metformin. Continue. Recheck labs today.

## 2022-06-10 ENCOUNTER — Other Ambulatory Visit: Payer: Self-pay

## 2022-06-10 ENCOUNTER — Emergency Department (HOSPITAL_BASED_OUTPATIENT_CLINIC_OR_DEPARTMENT_OTHER): Payer: Managed Care, Other (non HMO)

## 2022-06-10 ENCOUNTER — Inpatient Hospital Stay (HOSPITAL_BASED_OUTPATIENT_CLINIC_OR_DEPARTMENT_OTHER)
Admission: EM | Admit: 2022-06-10 | Discharge: 2022-06-12 | DRG: 392 | Disposition: A | Discharge status: Home / Self Care | Payer: Managed Care, Other (non HMO) | Attending: Internal Medicine | Admitting: Internal Medicine

## 2022-06-10 ENCOUNTER — Encounter (HOSPITAL_COMMUNITY): Payer: Self-pay

## 2022-06-10 ENCOUNTER — Encounter (HOSPITAL_BASED_OUTPATIENT_CLINIC_OR_DEPARTMENT_OTHER): Payer: Self-pay

## 2022-06-10 DIAGNOSIS — G4730 Sleep apnea, unspecified: Secondary | ICD-10-CM | POA: Diagnosis present

## 2022-06-10 DIAGNOSIS — Z8249 Family history of ischemic heart disease and other diseases of the circulatory system: Secondary | ICD-10-CM | POA: Diagnosis not present

## 2022-06-10 DIAGNOSIS — E876 Hypokalemia: Secondary | ICD-10-CM

## 2022-06-10 DIAGNOSIS — K5900 Constipation, unspecified: Secondary | ICD-10-CM | POA: Diagnosis present

## 2022-06-10 DIAGNOSIS — I152 Hypertension secondary to endocrine disorders: Secondary | ICD-10-CM | POA: Diagnosis present

## 2022-06-10 DIAGNOSIS — E1169 Type 2 diabetes mellitus with other specified complication: Secondary | ICD-10-CM | POA: Diagnosis present

## 2022-06-10 DIAGNOSIS — F419 Anxiety disorder, unspecified: Secondary | ICD-10-CM | POA: Diagnosis present

## 2022-06-10 DIAGNOSIS — D649 Anemia, unspecified: Secondary | ICD-10-CM

## 2022-06-10 DIAGNOSIS — R110 Nausea: Secondary | ICD-10-CM

## 2022-06-10 DIAGNOSIS — K5732 Diverticulitis of large intestine without perforation or abscess without bleeding: Secondary | ICD-10-CM | POA: Diagnosis present

## 2022-06-10 DIAGNOSIS — K219 Gastro-esophageal reflux disease without esophagitis: Secondary | ICD-10-CM | POA: Diagnosis present

## 2022-06-10 DIAGNOSIS — Z6841 Body Mass Index (BMI) 40.0 and over, adult: Secondary | ICD-10-CM | POA: Diagnosis not present

## 2022-06-10 DIAGNOSIS — K5792 Diverticulitis of intestine, part unspecified, without perforation or abscess without bleeding: Secondary | ICD-10-CM | POA: Diagnosis present

## 2022-06-10 DIAGNOSIS — D72829 Elevated white blood cell count, unspecified: Secondary | ICD-10-CM

## 2022-06-10 DIAGNOSIS — Z833 Family history of diabetes mellitus: Secondary | ICD-10-CM | POA: Diagnosis not present

## 2022-06-10 DIAGNOSIS — E1165 Type 2 diabetes mellitus with hyperglycemia: Secondary | ICD-10-CM | POA: Diagnosis present

## 2022-06-10 DIAGNOSIS — Z7984 Long term (current) use of oral hypoglycemic drugs: Secondary | ICD-10-CM

## 2022-06-10 DIAGNOSIS — E66813 Obesity, class 3: Secondary | ICD-10-CM | POA: Diagnosis present

## 2022-06-10 DIAGNOSIS — K760 Fatty (change of) liver, not elsewhere classified: Secondary | ICD-10-CM

## 2022-06-10 DIAGNOSIS — G4733 Obstructive sleep apnea (adult) (pediatric): Secondary | ICD-10-CM | POA: Diagnosis present

## 2022-06-10 DIAGNOSIS — Z79899 Other long term (current) drug therapy: Secondary | ICD-10-CM

## 2022-06-10 DIAGNOSIS — Z7985 Long-term (current) use of injectable non-insulin antidiabetic drugs: Secondary | ICD-10-CM

## 2022-06-10 DIAGNOSIS — Z823 Family history of stroke: Secondary | ICD-10-CM

## 2022-06-10 DIAGNOSIS — G4710 Hypersomnia, unspecified: Secondary | ICD-10-CM | POA: Diagnosis present

## 2022-06-10 DIAGNOSIS — E1159 Type 2 diabetes mellitus with other circulatory complications: Secondary | ICD-10-CM | POA: Diagnosis present

## 2022-06-10 HISTORY — DX: Diverticulitis of intestine, part unspecified, without perforation or abscess without bleeding: K57.92

## 2022-06-10 HISTORY — DX: Fatty (change of) liver, not elsewhere classified: K76.0

## 2022-06-10 HISTORY — DX: Hypokalemia: E87.6

## 2022-06-10 LAB — URINALYSIS, ROUTINE W REFLEX MICROSCOPIC
Bilirubin Urine: NEGATIVE
Glucose, UA: NEGATIVE mg/dL
Hgb urine dipstick: NEGATIVE
Ketones, ur: NEGATIVE mg/dL
Leukocytes,Ua: NEGATIVE
Nitrite: NEGATIVE
Protein, ur: 30 mg/dL — AB
Specific Gravity, Urine: 1.01 (ref 1.005–1.030)
pH: 7.5 (ref 5.0–8.0)

## 2022-06-10 LAB — CBC
HCT: 37.1 % — ABNORMAL LOW (ref 39.0–52.0)
Hemoglobin: 12.5 g/dL — ABNORMAL LOW (ref 13.0–17.0)
MCH: 27.8 pg (ref 26.0–34.0)
MCHC: 33.7 g/dL (ref 30.0–36.0)
MCV: 82.6 fL (ref 80.0–100.0)
Platelets: 465 10*3/uL — ABNORMAL HIGH (ref 150–400)
RBC: 4.49 MIL/uL (ref 4.22–5.81)
RDW: 14.8 % (ref 11.5–15.5)
WBC: 23 10*3/uL — ABNORMAL HIGH (ref 4.0–10.5)
nRBC: 0 % (ref 0.0–0.2)

## 2022-06-10 LAB — GLUCOSE, CAPILLARY
Glucose-Capillary: 102 mg/dL — ABNORMAL HIGH (ref 70–99)
Glucose-Capillary: 124 mg/dL — ABNORMAL HIGH (ref 70–99)

## 2022-06-10 LAB — URINALYSIS, MICROSCOPIC (REFLEX)
RBC / HPF: NONE SEEN RBC/hpf (ref 0–5)
WBC, UA: NONE SEEN WBC/hpf (ref 0–5)

## 2022-06-10 LAB — COMPREHENSIVE METABOLIC PANEL
ALT: 28 U/L (ref 0–44)
AST: 23 U/L (ref 15–41)
Albumin: 3.3 g/dL — ABNORMAL LOW (ref 3.5–5.0)
Alkaline Phosphatase: 89 U/L (ref 38–126)
Anion gap: 8 (ref 5–15)
BUN: 15 mg/dL (ref 6–20)
CO2: 24 mmol/L (ref 22–32)
Calcium: 8.9 mg/dL (ref 8.9–10.3)
Chloride: 105 mmol/L (ref 98–111)
Creatinine, Ser: 0.8 mg/dL (ref 0.61–1.24)
GFR, Estimated: >60 mL/min (ref >60)
Glucose, Bld: 170 mg/dL — ABNORMAL HIGH (ref 70–99)
Potassium: 3.4 mmol/L — ABNORMAL LOW (ref 3.5–5.1)
Sodium: 137 mmol/L (ref 135–145)
Total Bilirubin: 0.6 mg/dL (ref 0.3–1.2)
Total Protein: 7.1 g/dL (ref 6.5–8.1)

## 2022-06-10 LAB — MAGNESIUM: Magnesium: 1.8 mg/dL (ref 1.7–2.4)

## 2022-06-10 LAB — LIPASE, BLOOD: Lipase: 26 U/L (ref 11–51)

## 2022-06-10 MED ORDER — METFORMIN HCL ER 500 MG PO TB24: 1000.0000 mg | ORAL_TABLET | Freq: Every day | ORAL | Status: DC

## 2022-06-10 MED ORDER — ENOXAPARIN SODIUM 80 MG/0.8ML IJ SOSY
70.0000 mg | PREFILLED_SYRINGE | INTRAMUSCULAR | Status: DC
Start: 2022-06-10 — End: 2022-06-12
  Administered 2022-06-10 – 2022-06-11 (×2): 70 mg via SUBCUTANEOUS
  Filled 2022-06-10 (×2): qty 0.8

## 2022-06-10 MED ORDER — LISINOPRIL 10 MG PO TABS
10.0000 mg | ORAL_TABLET | Freq: Every day | ORAL | Status: DC
  Administered 2022-06-10 – 2022-06-12 (×3): 10 mg via ORAL
  Filled 2022-06-10 (×3): qty 1

## 2022-06-10 MED ORDER — POTASSIUM CHLORIDE 10 MEQ/100ML IV SOLN
10.0000 meq | INTRAVENOUS | Status: AC
  Administered 2022-06-10 (×4): 10 meq via INTRAVENOUS
  Filled 2022-06-10 (×4): qty 100

## 2022-06-10 MED ORDER — HYDROMORPHONE HCL 1 MG/ML IJ SOLN: 1.0000 mg | INTRAMUSCULAR | Status: DC | PRN

## 2022-06-10 MED ORDER — METFORMIN HCL 500 MG PO TABS: 500.0000 mg | ORAL_TABLET | Freq: Every day | ORAL | Status: DC

## 2022-06-10 MED ORDER — POTASSIUM CHLORIDE IN NACL 40-0.9 MEQ/L-% IV SOLN
INTRAVENOUS | Status: AC
  Filled 2022-06-10 (×2): qty 1000

## 2022-06-10 MED ORDER — LISINOPRIL-HYDROCHLOROTHIAZIDE 10-12.5 MG PO TABS
1.0000 | ORAL_TABLET | Freq: Every day | ORAL | Status: DC
Start: 2022-06-10 — End: 2022-06-10

## 2022-06-10 MED ORDER — IOHEXOL 300 MG/ML  SOLN
100.0000 mL | Freq: Once | INTRAMUSCULAR | Status: AC | PRN
  Administered 2022-06-10: 100 mL via INTRAVENOUS

## 2022-06-10 MED ORDER — SODIUM CHLORIDE 0.9 % IV SOLN
1.0000 g | INTRAVENOUS | Status: DC
  Administered 2022-06-10: 1 g via INTRAVENOUS
  Filled 2022-06-10: qty 10

## 2022-06-10 MED ORDER — SODIUM CHLORIDE 0.9 % IV SOLN: 2.0000 g | INTRAVENOUS | Status: DC

## 2022-06-10 MED ORDER — HYDROCHLOROTHIAZIDE 12.5 MG PO TABS
12.5000 mg | ORAL_TABLET | Freq: Every day | ORAL | Status: DC
  Administered 2022-06-10 – 2022-06-12 (×3): 12.5 mg via ORAL
  Filled 2022-06-10 (×3): qty 1

## 2022-06-10 MED ORDER — METRONIDAZOLE 500 MG/100ML IV SOLN
500.0000 mg | Freq: Two times a day (BID) | INTRAVENOUS | Status: DC
  Administered 2022-06-10 – 2022-06-12 (×4): 500 mg via INTRAVENOUS
  Filled 2022-06-10 (×5): qty 100

## 2022-06-10 MED ORDER — SODIUM CHLORIDE 0.9 % IV SOLN: INTRAVENOUS | Status: DC | PRN

## 2022-06-10 MED ORDER — MAGNESIUM SULFATE 2 GM/50ML IV SOLN
2.0000 g | Freq: Once | INTRAVENOUS | Status: AC
  Administered 2022-06-10: 2 g via INTRAVENOUS
  Filled 2022-06-10: qty 50

## 2022-06-10 MED ORDER — SERTRALINE HCL 100 MG PO TABS
100.0000 mg | ORAL_TABLET | Freq: Every day | ORAL | Status: DC
  Administered 2022-06-10 – 2022-06-12 (×3): 100 mg via ORAL
  Filled 2022-06-10 (×3): qty 1

## 2022-06-10 MED ORDER — CEFTRIAXONE SODIUM 1 G IJ SOLR
1.0000 g | Freq: Once | INTRAMUSCULAR | Status: AC
  Administered 2022-06-10: 1 g via INTRAVENOUS
  Filled 2022-06-10: qty 10

## 2022-06-10 MED ORDER — KETOROLAC TROMETHAMINE 30 MG/ML IJ SOLN
15.0000 mg | Freq: Once | INTRAMUSCULAR | Status: AC
  Administered 2022-06-10: 15 mg via INTRAVENOUS
  Filled 2022-06-10: qty 1

## 2022-06-10 MED ORDER — ONDANSETRON HCL 4 MG/2ML IJ SOLN: 4.0000 mg | Freq: Four times a day (QID) | INTRAMUSCULAR | Status: DC | PRN

## 2022-06-10 MED ORDER — ACETAMINOPHEN 325 MG PO TABS
650.0000 mg | ORAL_TABLET | Freq: Four times a day (QID) | ORAL | Status: DC | PRN
  Administered 2022-06-11: 650 mg via ORAL
  Filled 2022-06-10: qty 2

## 2022-06-10 MED ORDER — ONDANSETRON HCL 4 MG PO TABS: 4.0000 mg | ORAL_TABLET | Freq: Four times a day (QID) | ORAL | Status: DC | PRN

## 2022-06-10 MED ORDER — ACETAMINOPHEN 650 MG RE SUPP: 650.0000 mg | Freq: Four times a day (QID) | RECTAL | Status: DC | PRN

## 2022-06-10 MED ORDER — PANTOPRAZOLE SODIUM 40 MG IV SOLR
40.0000 mg | INTRAVENOUS | Status: DC
  Administered 2022-06-10: 40 mg via INTRAVENOUS
  Filled 2022-06-10: qty 10

## 2022-06-10 MED ORDER — CEFTRIAXONE SODIUM 2 G IJ SOLR
2.0000 g | Freq: Once | INTRAMUSCULAR | Status: DC
  Filled 2022-06-10: qty 20

## 2022-06-10 NOTE — ED Triage Notes (Signed)
C/o lower abdominal pain x 1 week. Hx diverticulitis. C/o diarrhea 2 weeks ago and now having constipation.  States salad irritates his diverticulosis and he had been eating it a lot lately.

## 2022-06-10 NOTE — Progress Notes (Signed)
Patient stated that he does not want to wear CPAP per order. Patient stated that he does not wear one at home.

## 2022-06-10 NOTE — ED Notes (Signed)
Pt does not have to urinate at this time, specimen cup provided

## 2022-06-10 NOTE — ED Notes (Signed)
Made PT aware urine is Needed

## 2022-06-10 NOTE — Progress Notes (Signed)
Plan of Care Note for accepted transfer   Patient: John Mullins MRN: [id number]   DOA: 06/10/2022  Facility requesting transfer: [facility].  Requesting Provider: Garnette Gunner, MD Reason for transfer: Acute diverticulitis. Facility course:  Per EDP: " Chief Complaint(s) Abdominal Pain   HPI John Mullins is a 51 y.o. male with history of diabetes, hypertension presenting to the emergency department with abdominal pain.  Patient reports 1 week of left lower quadrant abdominal pain.  He reports associated constipation.  Last bowel movement today.  He denies any blood in his stool.  He has been taking laxatives without significant improvement.  He reports that the week prior to this he had a lot of diarrhea.  He denies fevers, chills, chest pain, nausea, vomiting, back pain, dysuria.  He reports pain is somewhat similar to prior episode of diverticulitis."  The patient received ceftriaxone, metronidazole and ketorolac in the ED.  Lab work:  Urinalysis, Microscopic (reflex) [[id number]] (Abnormal)   Collected: 06/10/22 0958   Updated: 06/10/22 1018    RBC / HPF NONE SEEN RBC/hpf   WBC, UA NONE SEEN WBC/hpf   Bacteria, UA RARE Abnormal    Squamous Epithelial / LPF 0-5  Urinalysis, Routine w reflex microscopic Urine, Clean Catch [[id number]] (Abnormal)   Collected: 06/10/22 0958   Updated: 06/10/22 1018   Specimen Source: Urine, Clean Catch    Color, Urine YELLOW   APPearance CLEAR   Specific Gravity, Urine 1.010   pH 7.5   Glucose, UA NEGATIVE mg/dL   Hgb urine dipstick NEGATIVE   Bilirubin Urine NEGATIVE   Ketones, ur NEGATIVE mg/dL   Protein, ur 30 Abnormal  mg/dL   Nitrite NEGATIVE   Leukocytes,Ua NEGATIVE  Lipase, blood [[id number]]   Collected: 06/10/22 0732   Updated: 06/10/22 0802   Specimen Type: Blood    Lipase 26 U/L  Comprehensive metabolic panel [[id number]] (Abnormal)   Collected: 06/10/22 0732   Updated: 06/10/22 0802   Specimen Type: Blood     Sodium 137 mmol/L   Potassium 3.4 Low  mmol/L   Chloride 105 mmol/L   CO2 24 mmol/L   Glucose, Bld 170 High  mg/dL   BUN 15 mg/dL   Creatinine, Ser 0.80 mg/dL   Calcium 8.9 mg/dL   Total Protein 7.1 g/dL   Albumin 3.3 Low  g/dL   AST 23 U/L   ALT 28 U/L   Alkaline Phosphatase 89 U/L   Total Bilirubin 0.6 mg/dL   GFR, Estimated >60 mL/min   Anion gap 8  CBC [[id number]] (Abnormal)   Collected: 06/10/22 0732   Updated: 06/10/22 0739   Specimen Type: Blood    WBC 23.0 High  K/uL   RBC 4.49 MIL/uL   Hemoglobin 12.5 Low  g/dL   HCT 37.1 Low  %   MCV 82.6 fL   MCH 27.8 pg   MCHC 33.7 g/dL   RDW 14.8 %   Platelets 465 High  K/uL   nRBC 0.0 %   Imaging: EXAM: CT ABDOMEN AND PELVIS WITH CONTRAST   TECHNIQUE: Multidetector CT imaging of the abdomen and pelvis was performed using the standard protocol following bolus administration of intravenous contrast.   RADIATION DOSE REDUCTION: This exam was performed according to the departmental dose-optimization program which includes automated exposure control, adjustment of the mA and/or kV according to patient size and/or use of iterative reconstruction technique.   CONTRAST:  17m OMNIPAQUE IOHEXOL 300 MG/ML  SOLN   COMPARISON:  November 23, 2019.   FINDINGS: Lower chest: No acute abnormality.   Hepatobiliary: No gallstones or biliary dilatation is noted. Hepatic steatosis.   Pancreas: Unremarkable. No pancreatic ductal dilatation or surrounding inflammatory changes.   Spleen: Mild splenomegaly is noted.   Adrenals/Urinary Tract: Stable left adrenal adenoma. Right adrenal gland appears normal. No hydronephrosis or renal obstruction is noted. No renal or ureteral calculi are noted. Urinary bladder is unremarkable.   Stomach/Bowel: The stomach and appendix are unremarkable. There is no evidence of bowel obstruction. Severe proximal sigmoid diverticulitis is noted without definite abscess formation.    Vascular/Lymphatic: No significant vascular findings are present. 12 mm left common iliac lymph node is noted which is slightly enlarged compared to prior exam but most likely is inflammatory or reactive in etiology. Grossly stable periaortic adenopathy is also noted which most likely is reactive or inflammatory in etiology.   Reproductive: Prostate is unremarkable.   Other: No abdominal wall hernia or abnormality. No abdominopelvic ascites.   Musculoskeletal: No acute or significant osseous findings.   IMPRESSION: Severe proximal sigmoid diverticulitis is noted. No definite abscess formation is noted.   Hepatic steatosis.   Mild splenomegaly.     Electronically Signed   By: Marijo Conception M.D.   On: 06/10/2022 08:44  Plan of care: The patient is accepted for admission to [place]  unit, at [facility].  Magnesium level added to the labs.  KCl 10 mEq IVPB x 4 ordered.    Author: Reubin Milan, MD 06/10/2022  Check [email or web address] for on-call coverage.  Nursing staff, Please call [place] number on Amion as soon as patient's arrival, so appropriate admitting provider can evaluate the pt.

## 2022-06-10 NOTE — H&P (Signed)
History and Physical    Patient: John Mullins UXN:[id number] DOB: 02/04/1971 DOA: 06/10/2022 DOS: the patient was seen and examined on 06/10/2022 PCP: Maximiano Coss, NP  Patient coming from: Home  Chief Complaint:  Chief Complaint  Patient presents with   Abdominal Pain   HPI: John Mullins is a 51 y.o. male with medical history significant of anxiety, type 2 diabetes, GERD, diverticulosis, history of diverticulitis, hypertension, hypersomnolence with sleep apnea who presented to the emergency department with abdominal pain since Saturday associated with constipation and mild malaise.  He took Ex-Lax yesterday which relieve his constipation.  No nausea, emesis, diarrhea, melena or hematochezia. He denied fever, chills, rhinorrhea, sore throat, wheezing or hemoptysis.  No chest pain, palpitations, diaphoresis, PND, orthopnea or pitting edema of the lower extremities.   No flank pain, dysuria, frequency or hematuria.  No polyuria, polydipsia, polyphagia or blurred vision.   ED course: Initial vital signs were temperature 99.3 F, pulse 103, respiration 20, BP 130/78 mmHg O2 sat 94% on room air.  The patient received ceftriaxone 2 g IVPB, ketorolac 15 mg IVPB and metronidazole 500 mg IVPB.  Lab work: His urinalysis show proteinuria 30 mg deciliter and rare bacteria microscopic examination.  CBC is her white count of 23.0, hemoglobin 12.5 g/dL platelets 165.  Lipase was 26 units/L.  CMP showed a potassium of 3.4 mmol/L, glucose 170 mg deciliter and an albumin 3.3 g/dL.  The rest of the CMP measurements were normal.  Magnesium was 1.8 mg/deciliter.  Imaging: CT abdomen/pelvis with contrast shows severe proximal sigmoid diverticulitis, hepatic asteatosis and mild splenomegaly.   Review of Systems: As mentioned in the history of present illness. All other systems reviewed and are negative.  Past Medical History:  Diagnosis Date   Anxiety    Diabetes mellitus without complication ([place])     Diverticulitis    GERD (gastroesophageal reflux disease)    Hypertension    Sleep apnea    Past Surgical History:  Procedure Laterality Date   ARTHOSCOPIC ROTAOR CUFF REPAIR Right 10/03/2019   Procedure: ARTHROSCOPIC ROTATOR CUFF REPAIR;  Surgeon: Tania Ade, MD;  Location: [facility];  Service: Orthopedics;  Laterality: Right;   FRACTURE SURGERY Right 1990s   SHOULDER ARTHROSCOPY WITH SUBACROMIAL DECOMPRESSION Right 10/03/2019   Procedure: SHOULDER ARTHROSCOPY WITH SUBACROMIAL DECOMPRESSION;  Surgeon: Tania Ade, MD;  Location: [facility];  Service: Orthopedics;  Laterality: Right;   Social History:  reports that he has never smoked. He has never used smokeless tobacco. He reports that he does not drink alcohol and does not use drugs.  No Known Allergies  Family History  Problem Relation Age of Onset   Heart disease Father    Stroke Father    Diabetes Father     Prior to Admission medications   Medication Sig Start Date End Date Taking? Authorizing Provider  Dulaglutide (TRULICITY) 3 UR/4.2HC SOPN Inject 3 mg into the skin once a week. 05/18/22   Maximiano Coss, NP  Dulaglutide 1.5 MG/0.5ML SOPN Inject 1.5 mg into the skin once a week. 05/18/22   Maximiano Coss, NP  fluticasone (FLONASE) 50 MCG/ACT nasal spray Place 2 sprays into both nostrils daily. 05/18/22   Maximiano Coss, NP  lisinopril-hydrochlorothiazide (ZESTORETIC) 10-12.5 MG tablet Take 1 tablet by mouth daily. 05/18/22   Maximiano Coss, NP  metFORMIN (GLUCOPHAGE-XR) 500 MG 24 hr tablet TAKE 2 TABLETS BY MOUTH ONCE DAILY WITH BREAKFAST AND 1 TABLET WITH DINNER 05/18/22   Maximiano Coss, NP  nystatin (MYCOSTATIN) 100000 UNIT/ML  suspension Take 5 mLs (500,000 Units total) by mouth 4 (four) times daily. 08/31/21   Maximiano Coss, NP  omeprazole (PRILOSEC) 40 MG capsule Take 1 capsule (40 mg total) by mouth daily. 05/18/22   Maximiano Coss, NP  ondansetron (ZOFRAN-ODT) 4 MG disintegrating tablet Take 1 tablet (4 mg total) by  mouth every 8 (eight) hours as needed for nausea or vomiting. 09/06/21   Maximiano Coss, NP  rosuvastatin (CRESTOR) 20 MG tablet Take 1 tablet (20 mg total) by mouth daily. 05/18/22   Maximiano Coss, NP  sertraline (ZOLOFT) 100 MG tablet Take 1 tablet (100 mg total) by mouth daily. 05/18/22   Maximiano Coss, NP    Physical Exam: Vitals:   06/10/22 1105 06/10/22 1300 06/10/22 1330 06/10/22 1431  BP:  (!) 112/57 124/68 134/78  Pulse:  82 81 83  Resp:  (!) 25 (!) 24 18  Temp: 98.3 F (36.8 C)  98.4 F (36.9 C)   TempSrc: Oral  Oral   SpO2:  96% 96% 99%  Weight:      Height:       Physical Exam Vitals and nursing note reviewed.  Constitutional:      General: He is awake. He is not in acute distress.    Appearance: He is obese.  HENT:     Head: Normocephalic.     Mouth/Throat:     Mouth: Mucous membranes are moist.  Eyes:     General: No scleral icterus.    Pupils: Pupils are equal, round, and reactive to light.  Neck:     Vascular: No JVD.  Cardiovascular:     Rate and Rhythm: Normal rate and regular rhythm.     Heart sounds: S1 normal and S2 normal.  Pulmonary:     Effort: Pulmonary effort is normal.     Breath sounds: Normal breath sounds.  Abdominal:     General: Bowel sounds are normal. There is no distension.     Palpations: Abdomen is soft.     Tenderness: There is abdominal tenderness in the left lower quadrant. There is no guarding or rebound.  Musculoskeletal:     Cervical back: Neck supple.     Right lower leg: No edema.     Left lower leg: No edema.  Skin:    General: Skin is warm and dry.  Neurological:     General: No focal deficit present.     Mental Status: He is alert and oriented to person, place, and time.  Psychiatric:        Mood and Affect: Mood normal.        Behavior: Behavior normal. Behavior is cooperative.   Data Reviewed:  There are no new results to review at this time.  Assessment and Plan: Principal Problem:   Acute  diverticulitis Admit to MedSurg/inpatient. Continue IV fluids. Analgesics as needed. Antiemetics as needed. Continue ceftriaxone 2 g every 24 hours. Continue metronidazole 500 mg IVPB q 12 hr. Follow CBC and CMP in a.m.  Active Problems:   Hypertension associated with diabetes (HCC) Continue lisinopril 10 mg p.o. daily.   Continue hydrochlorothiazide 12.5 mg p.o. daily. Monitor BP, renal function electrolytes.    GERD (gastroesophageal reflux disease) Continue PPI.    Hypersomnia with sleep apnea CPAP at bedtime.    Uncontrolled type 2 diabetes mellitus with hyperglycemia (HCC) Carbohydrate modified diet. Continue metformin 1000 mg p.o. in AM. Continue metformin XR 500 mg p.o. in p.m.    Hypokalemia Replacing. Mg supplemented. Follow potassium level.  Will need low-dose daily replacement on discharge.    Hepatic steatosis   Obesity, Class III, BMI 40-49.9 (morbid obesity) (HCC) Has lost 60 pounds in the last 2 years. Continue with lifestyle modifications. Follow-up with primary care provider.      Advance Care Planning:   Code Status: Full Code   Consults:   Family Communication:   Severity of Illness: The appropriate patient status for this patient is INPATIENT. Inpatient status is judged to be reasonable and necessary in order to provide the required intensity of service to ensure the patient's safety. The patient's presenting symptoms, physical exam findings, and initial radiographic and laboratory data in the context of their chronic comorbidities is felt to place them at high risk for further clinical deterioration. Furthermore, it is not anticipated that the patient will be medically stable for discharge from the hospital within 2 midnights of admission.   * I certify that at the point of admission it is my clinical judgment that the patient will require inpatient hospital care spanning beyond 2 midnights from the point of admission due to high intensity of  service, high risk for further deterioration and high frequency of surveillance required.*  Author: Reubin Milan, MD 06/10/2022 2:43 PM  For on call review [email or web address].   This document was prepared using Dragon voice recognition software and may contain some unintended transcription errors.

## 2022-06-10 NOTE — ED Provider Notes (Signed)
[facility] EMERGENCY DEPARTMENT Provider Note  CSN: [id number] Arrival date & time: 06/10/22 0705  Chief Complaint(s) Abdominal Pain  HPI John Mullins is a 51 y.o. male with history of diabetes, hypertension presenting to the emergency department with abdominal pain.  Patient reports 1 week of left lower quadrant abdominal pain.  He reports associated constipation.  Last bowel movement today.  He denies any blood in his stool.  He has been taking laxatives without significant improvement.  He reports that the week prior to this he had a lot of diarrhea.  He denies fevers, chills, chest pain, nausea, vomiting, back pain, dysuria.  He reports pain is somewhat similar to prior episode of diverticulitis.   Past Medical History Past Medical History:  Diagnosis Date   Anxiety    Diabetes mellitus without complication ([place])    Diverticulitis    GERD (gastroesophageal reflux disease)    Hypertension    Sleep apnea    Patient Active Problem List   Diagnosis Date Noted   Acute diverticulitis 06/10/2022   Well controlled type 2 diabetes mellitus ([place]) 01/27/2022   COVID-19 vaccination declined 08/13/2021   Left adrenal mass ([place]) 08/13/2021   Morbid obesity due to excess calories ([place]) 09/01/2015   Hypersomnia with sleep apnea 09/01/2015   Snoring 09/01/2015   Anxiety state 06/04/2013   GERD (gastroesophageal reflux disease) 06/04/2013   Obesity, Class III, BMI 40-49.9 (morbid obesity) ([place]) 06/04/2013   Hypertension associated with diabetes ([place])    HIATAL HERNIA 11/28/2006   Home Medication(s) Prior to Admission medications   Medication Sig Start Date End Date Taking? Authorizing Provider  Dulaglutide (TRULICITY) 3 MG/8.6PY SOPN INJECT '3MG'$  INTO THE SKIN ONCE A WEEK 05/18/22   Maximiano Coss, NP  Dulaglutide (TRULICITY) 3 PP/5.0DT SOPN Inject 3 mg into the skin once a week. 05/18/22   Maximiano Coss, NP  Dulaglutide 1.5 MG/0.5ML SOPN Inject 1.5 mg into the skin once a  week. 05/18/22   Maximiano Coss, NP  fluticasone (FLONASE) 50 MCG/ACT nasal spray Place 2 sprays into both nostrils daily. 05/18/22   Maximiano Coss, NP  lisinopril-hydrochlorothiazide (ZESTORETIC) 10-12.5 MG tablet Take 1 tablet by mouth daily. 05/18/22   Maximiano Coss, NP  metFORMIN (GLUCOPHAGE-XR) 500 MG 24 hr tablet TAKE 2 TABLETS BY MOUTH ONCE DAILY WITH BREAKFAST AND 1 TABLET WITH DINNER 05/18/22   Maximiano Coss, NP  nystatin (MYCOSTATIN) 100000 UNIT/ML suspension Take 5 mLs (500,000 Units total) by mouth 4 (four) times daily. 08/31/21   Maximiano Coss, NP  omeprazole (PRILOSEC) 40 MG capsule Take 1 capsule (40 mg total) by mouth daily. 05/18/22   Maximiano Coss, NP  ondansetron (ZOFRAN-ODT) 4 MG disintegrating tablet Take 1 tablet (4 mg total) by mouth every 8 (eight) hours as needed for nausea or vomiting. 09/06/21   Maximiano Coss, NP  rosuvastatin (CRESTOR) 20 MG tablet Take 1 tablet (20 mg total) by mouth daily. 05/18/22   Maximiano Coss, NP  sertraline (ZOLOFT) 100 MG tablet Take 1 tablet (100 mg total) by mouth daily. 05/18/22   Maximiano Coss, NP  Past Surgical History Past Surgical History:  Procedure Laterality Date   ARTHOSCOPIC ROTAOR CUFF REPAIR Right 10/03/2019   Procedure: ARTHROSCOPIC ROTATOR CUFF REPAIR;  Surgeon: Tania Ade, MD;  Location: [facility];  Service: Orthopedics;  Laterality: Right;   FRACTURE SURGERY Right 1990s   SHOULDER ARTHROSCOPY WITH SUBACROMIAL DECOMPRESSION Right 10/03/2019   Procedure: SHOULDER ARTHROSCOPY WITH SUBACROMIAL DECOMPRESSION;  Surgeon: Tania Ade, MD;  Location: [facility];  Service: Orthopedics;  Laterality: Right;   Family History Family History  Problem Relation Age of Onset   Heart disease Father    Stroke Father    Diabetes Father     Social History Social History   Tobacco Use   Smoking  status: Never   Smokeless tobacco: Never  Vaping Use   Vaping Use: Never used  Substance Use Topics   Alcohol use: No   Drug use: No   Allergies Patient has no known allergies.  Review of Systems Review of Systems  All other systems reviewed and are negative.   Physical Exam Vital Signs  I have reviewed the triage vital signs BP 132/85   Pulse 87   Temp 99.3 F (37.4 C) (Oral)   Resp 18   Ht 6' (1.829 m)   Wt (!) 145.2 kg   SpO2 98%   BMI 43.40 kg/m  Physical Exam Vitals and nursing note reviewed.  Constitutional:      General: He is not in acute distress.    Appearance: Normal appearance.  HENT:     Mouth/Throat:     Mouth: Mucous membranes are moist.  Cardiovascular:     Rate and Rhythm: Normal rate and regular rhythm.  Pulmonary:     Effort: Pulmonary effort is normal. No respiratory distress.     Breath sounds: Normal breath sounds.  Abdominal:     General: Abdomen is flat.     Palpations: Abdomen is soft.     Tenderness: There is abdominal tenderness (LLQ).  Skin:    General: Skin is warm and dry.     Capillary Refill: Capillary refill takes less than 2 seconds.  Neurological:     Mental Status: He is alert and oriented to person, place, and time. Mental status is at baseline.  Psychiatric:        Mood and Affect: Mood normal.        Behavior: Behavior normal.     ED Results and Treatments Labs (all labs ordered are listed, but only abnormal results are displayed) Labs Reviewed  COMPREHENSIVE METABOLIC PANEL - Abnormal; Notable for the following components:      Result Value   Potassium 3.4 (*)    Glucose, Bld 170 (*)    Albumin 3.3 (*)    All other components within normal limits  CBC - Abnormal; Notable for the following components:   WBC 23.0 (*)    Hemoglobin 12.5 (*)    HCT 37.1 (*)    Platelets 465 (*)    All other components within normal limits  URINALYSIS, ROUTINE W REFLEX MICROSCOPIC - Abnormal; Notable for the following  components:   Protein, ur 30 (*)    All other components within normal limits  URINALYSIS, MICROSCOPIC (REFLEX) - Abnormal; Notable for the following components:   Bacteria, UA RARE (*)    All other components within normal limits  LIPASE, BLOOD  MAGNESIUM  Radiology CT Abdomen Pelvis W Contrast  Result Date: 06/10/2022 CLINICAL DATA:  Acute left lower quadrant abdominal pain. EXAM: CT ABDOMEN AND PELVIS WITH CONTRAST TECHNIQUE: Multidetector CT imaging of the abdomen and pelvis was performed using the standard protocol following bolus administration of intravenous contrast. RADIATION DOSE REDUCTION: This exam was performed according to the departmental dose-optimization program which includes automated exposure control, adjustment of the mA and/or kV according to patient size and/or use of iterative reconstruction technique. CONTRAST:  148m OMNIPAQUE IOHEXOL 300 MG/ML  SOLN COMPARISON:  November 23, 2019. FINDINGS: Lower chest: No acute abnormality. Hepatobiliary: No gallstones or biliary dilatation is noted. Hepatic steatosis. Pancreas: Unremarkable. No pancreatic ductal dilatation or surrounding inflammatory changes. Spleen: Mild splenomegaly is noted. Adrenals/Urinary Tract: Stable left adrenal adenoma. Right adrenal gland appears normal. No hydronephrosis or renal obstruction is noted. No renal or ureteral calculi are noted. Urinary bladder is unremarkable. Stomach/Bowel: The stomach and appendix are unremarkable. There is no evidence of bowel obstruction. Severe proximal sigmoid diverticulitis is noted without definite abscess formation. Vascular/Lymphatic: No significant vascular findings are present. 12 mm left common iliac lymph node is noted which is slightly enlarged compared to prior exam but most likely is inflammatory or reactive in etiology. Grossly stable periaortic  adenopathy is also noted which most likely is reactive or inflammatory in etiology. Reproductive: Prostate is unremarkable. Other: No abdominal wall hernia or abnormality. No abdominopelvic ascites. Musculoskeletal: No acute or significant osseous findings. IMPRESSION: Severe proximal sigmoid diverticulitis is noted. No definite abscess formation is noted. Hepatic steatosis. Mild splenomegaly. Electronically Signed   By: JMarijo ConceptionM.D.   On: 06/10/2022 08:44    Pertinent labs & imaging results that were available during my care of the patient were reviewed by me and considered in my medical decision making (see MDM for details).  Medications Ordered in ED Medications  metroNIDAZOLE (FLAGYL) IVPB 500 mg (0 mg Intravenous Hold 06/10/22 0959)  0.9 %  sodium chloride infusion ( Intravenous New Bag/Given 06/10/22 0954)  cefTRIAXone (ROCEPHIN) 2 g in sodium chloride 0.9 % 100 mL IVPB (has no administration in time range)  potassium chloride 10 mEq in 100 mL IVPB (10 mEq Intravenous New Bag/Given 06/10/22 1041)  cefTRIAXone (ROCEPHIN) 1 g in sodium chloride 0.9 % 100 mL IVPB (1 g Intravenous New Bag/Given 06/10/22 1044)  iohexol (OMNIPAQUE) 300 MG/ML solution 100 mL (100 mLs Intravenous Contrast Given 06/10/22 0819)  ketorolac (TORADOL) 30 MG/ML injection 15 mg (15 mg Intravenous Given 06/10/22 0954)                                                                                                                                     Procedures Procedures  (including critical care time)  Medical Decision Making / ED Course   MDM:  51year old male presenting to the emergency department with abdominal pain.  Patient well-appearing, vital signs reassuring.  Exam notable for left  lower quadrant tenderness.  Concern for diverticulitis, complicated diverticulitis, abscess, perforation, constipation, obstruction.  Labs notable for very elevated WBC count concerning for infectious process.  Will obtain CT  scan to further evaluate.  Lower concern for urinary cause given no dysuria but will check urinalysis.  Patient declined pain medicine at this time  Clinical Course as of 06/10/22 1046  Fri Jun 10, 2022  1044 Patient will be admitted for inpatient treatment given WBC elevation, severe features on imaging. Discussed with hospitalist.  [WS]    Clinical Course User Index [WS] Cristie Hem, MD     Additional history obtained: -Additional history obtained from wife -External records from outside source obtained and reviewed including: Chart review including previous notes, labs, imaging, consultation notes   Lab Tests: -I ordered, reviewed, and interpreted labs.   The pertinent results include:   Labs Reviewed  COMPREHENSIVE METABOLIC PANEL - Abnormal; Notable for the following components:      Result Value   Potassium 3.4 (*)    Glucose, Bld 170 (*)    Albumin 3.3 (*)    All other components within normal limits  CBC - Abnormal; Notable for the following components:   WBC 23.0 (*)    Hemoglobin 12.5 (*)    HCT 37.1 (*)    Platelets 465 (*)    All other components within normal limits  URINALYSIS, ROUTINE W REFLEX MICROSCOPIC - Abnormal; Notable for the following components:   Protein, ur 30 (*)    All other components within normal limits  URINALYSIS, MICROSCOPIC (REFLEX) - Abnormal; Notable for the following components:   Bacteria, UA RARE (*)    All other components within normal limits  LIPASE, BLOOD  MAGNESIUM      EKG   EKG Interpretation  Date/Time:    Ventricular Rate:    PR Interval:    QRS Duration:   QT Interval:    QTC Calculation:   R Axis:     Text Interpretation:           Imaging Studies ordered: I ordered imaging studies including CT abdomen and pelvis I independently visualized and interpreted imaging. I agree with the radiologist interpretation   Medicines ordered and prescription drug management: Meds ordered this encounter   Medications   iohexol (OMNIPAQUE) 300 MG/ML solution 100 mL   DISCONTD: cefTRIAXone (ROCEPHIN) 1 g in sodium chloride 0.9 % 100 mL IVPB    Order Specific Question:   Antibiotic Indication:    Answer:   Intra-abdominal   metroNIDAZOLE (FLAGYL) IVPB 500 mg    Order Specific Question:   Antibiotic Indication:    Answer:   Intra-abdominal Infection   ketorolac (TORADOL) 30 MG/ML injection 15 mg   0.9 %  sodium chloride infusion    Carrier Fluid Protocol   cefTRIAXone (ROCEPHIN) 2 g in sodium chloride 0.9 % 100 mL IVPB    Order Specific Question:   Antibiotic Indication:    Answer:   Intra-abdominal   DISCONTD: cefTRIAXone (ROCEPHIN) 2 g in sodium chloride 0.9 % 100 mL IVPB    The patient currently has 145 kg of weight.    Order Specific Question:   Antibiotic Indication:    Answer:   Intra-abdominal   potassium chloride 10 mEq in 100 mL IVPB   cefTRIAXone (ROCEPHIN) 1 g in sodium chloride 0.9 % 100 mL IVPB    The patient currently has 145 kg of weight.    Order Specific Question:   Antibiotic Indication:  Answer:   Intra-abdominal    -I have reviewed the patients home medicines and have made adjustments as needed    Consultations Obtained: I requested consultation with the hospitalist,  and discussed lab and imaging findings as well as pertinent plan - they recommend: Admission   Cardiac Monitoring: The patient was maintained on a cardiac monitor.  I personally viewed and interpreted the cardiac monitored which showed an underlying rhythm of: Normal sinus rhythm   Reevaluation: After the interventions noted above, I reevaluated the patient and found that they have :improved  Co morbidities that complicate the patient evaluation  Past Medical History:  Diagnosis Date   Anxiety    Diabetes mellitus without complication ([place])    Diverticulitis    GERD (gastroesophageal reflux disease)    Hypertension    Sleep apnea       Dispostion: I considered admission for this  patient, and given elevated WBC count and severe features of diverticulitis on imaging, I have admitted the patient for serial examinations, repeat laboratory testing, intravenous antibiotics, and close reassessment     Final Clinical Impression(s) / ED Diagnoses Final diagnoses:  Diverticulitis  Leukocytosis, unspecified type     This chart was dictated using voice recognition software.  Despite best efforts to proofread,  errors can occur which can change the documentation meaning.    Cristie Hem, MD 06/10/22 1046

## 2022-06-10 NOTE — ED Notes (Signed)
Pt ambulatory with steady gait to bathroom

## 2022-06-10 NOTE — Plan of Care (Signed)
  Problem: Education: Goal: Ability to describe self-care measures that may prevent or decrease complications (Diabetes Survival Skills Education) will improve Outcome: Progressing   

## 2022-06-11 ENCOUNTER — Encounter (HOSPITAL_COMMUNITY): Payer: Self-pay | Admitting: Internal Medicine

## 2022-06-11 DIAGNOSIS — K760 Fatty (change of) liver, not elsewhere classified: Secondary | ICD-10-CM

## 2022-06-11 DIAGNOSIS — D649 Anemia, unspecified: Secondary | ICD-10-CM

## 2022-06-11 DIAGNOSIS — K5792 Diverticulitis of intestine, part unspecified, without perforation or abscess without bleeding: Secondary | ICD-10-CM | POA: Diagnosis not present

## 2022-06-11 LAB — COMPREHENSIVE METABOLIC PANEL
ALT: 22 U/L (ref 0–44)
AST: 15 U/L (ref 15–41)
Albumin: 3.1 g/dL — ABNORMAL LOW (ref 3.5–5.0)
Alkaline Phosphatase: 80 U/L (ref 38–126)
Anion gap: 8 (ref 5–15)
BUN: 10 mg/dL (ref 6–20)
CO2: 25 mmol/L (ref 22–32)
Calcium: 8.7 mg/dL — ABNORMAL LOW (ref 8.9–10.3)
Chloride: 107 mmol/L (ref 98–111)
Creatinine, Ser: 0.8 mg/dL (ref 0.61–1.24)
GFR, Estimated: >60 mL/min (ref >60)
Glucose, Bld: 111 mg/dL — ABNORMAL HIGH (ref 70–99)
Potassium: 3.7 mmol/L (ref 3.5–5.1)
Sodium: 140 mmol/L (ref 135–145)
Total Bilirubin: 0.7 mg/dL (ref 0.3–1.2)
Total Protein: 7 g/dL (ref 6.5–8.1)

## 2022-06-11 LAB — CBC
HCT: 36.3 % — ABNORMAL LOW (ref 39.0–52.0)
Hemoglobin: 11.8 g/dL — ABNORMAL LOW (ref 13.0–17.0)
MCH: 27.4 pg (ref 26.0–34.0)
MCHC: 32.5 g/dL (ref 30.0–36.0)
MCV: 84.4 fL (ref 80.0–100.0)
Platelets: 444 10*3/uL — ABNORMAL HIGH (ref 150–400)
RBC: 4.3 MIL/uL (ref 4.22–5.81)
RDW: 14.8 % (ref 11.5–15.5)
WBC: 17.3 10*3/uL — ABNORMAL HIGH (ref 4.0–10.5)
nRBC: 0 % (ref 0.0–0.2)

## 2022-06-11 LAB — GLUCOSE, CAPILLARY
Glucose-Capillary: 114 mg/dL — ABNORMAL HIGH (ref 70–99)
Glucose-Capillary: 138 mg/dL — ABNORMAL HIGH (ref 70–99)
Glucose-Capillary: 100 mg/dL — ABNORMAL HIGH (ref 70–99)
Glucose-Capillary: 111 mg/dL — ABNORMAL HIGH (ref 70–99)

## 2022-06-11 LAB — HIV ANTIBODY (ROUTINE TESTING W REFLEX): HIV Screen 4th Generation wRfx: NONREACTIVE

## 2022-06-11 MED ORDER — STERILE WATER FOR INJECTION IJ SOLN
INTRAMUSCULAR | Status: AC
  Filled 2022-06-11: qty 10

## 2022-06-11 MED ORDER — INSULIN ASPART 100 UNIT/ML IJ SOLN
0.0000 [IU] | Freq: Three times a day (TID) | INTRAMUSCULAR | Status: DC
  Administered 2022-06-11 – 2022-06-12 (×2): 1 [IU] via SUBCUTANEOUS

## 2022-06-11 MED ORDER — INSULIN ASPART 100 UNIT/ML IJ SOLN: 0.0000 [IU] | Freq: Every day | INTRAMUSCULAR | Status: DC

## 2022-06-11 MED ORDER — PANTOPRAZOLE SODIUM 40 MG PO TBEC
40.0000 mg | DELAYED_RELEASE_TABLET | Freq: Every day | ORAL | Status: DC
  Administered 2022-06-11: 40 mg via ORAL
  Filled 2022-06-11: qty 1

## 2022-06-11 NOTE — Consult Note (Signed)
Consultation  Referring Provider:      Primary Care Physician:  Maximiano Coss, NP Primary Gastroenterologist:         Reason for Consultation:          Impression / Plan:   Acute sigmoid diverticulitis-recurrent  Nonalcoholic fatty liver disease in the setting of diabetes mellitus and obesity  GERD, prior dysphagia years ago no problems on daily PPI  Mild anemia-normocytic Slight thrombocytosis ------------------------------------------------------------------------------- He says he has improved significantly and his abdominal exam is certainly not concerning, mild tenderness in the left lower quadrant to deep palpation.  I think he could go home in the morning, I would treat him with Augmentin 875 mg twice daily for a total antibiotic treatment course of 10 days.  Stay on a soft diet.  MiraLAX as needed.  I will arrange outpatient follow-up in our clinic and eventually 6 to 8 weeks after this admission he can have a colonoscopy which is appropriate given his history of diverticulitis.  B12 and ferritin/iron TIBC in a.m. Re: Anemia  I appreciate the opportunity to care for this patient.  Gatha Mayer, MD, [facility] See Shea Evans on call - gastroenterology for best contact person 06/11/2022 1:30 PM         HPI:   John Mullins is a 51 y.o. male admitted yesterday with acute sigmoid diverticulitis, diagnosed by CT scan.  Recent history is that of increasing salad intakes for 2 weeks having some diarrhea, took dicyclomine which he had on hand for IBS.  This constipated him.  Then he had bilateral lower quadrant abdominal pain and presented to the [place] ED where the diverticulitis was diagnosed.  There was a 12 mm lymph node in the region enlarged as well.  I have viewed the images of the scan.  He has received ceftriaxone and metronidazole since admission yesterday and feels significantly better and is asking to go home.  No stool  reported yet.  He never had a fever and there has been no rectal bleeding.  No history of colonoscopy.  No family history of colon cancer.  He says his mother had recurrent diverticulitis.  He reports having had several episodes of diverticulitis over the years treated as an outpatient.  In 2021 he had CT showing acute sigmoid diverticulitis.  On that exam he had a left adrenal lesion followed up with MRI that showed it was a benign adenoma.  Reports history of GERD and hiatal hernia and dysphagia, no previous EGD but all symptoms resolved on PPI which she takes faithfully.  Is losing weight through diet and Trulicity.  60 pounds lost since 2021.  At that point he had blood sugars of 700 and visual disturbance.  Globin A1c 6.6 May 18, 2022.  Past Medical History:  Diagnosis Date   Anxiety    Diabetes mellitus without complication ([place])    Diverticulitis    GERD (gastroesophageal reflux disease)    Hypertension    Sleep apnea     Past Surgical History:  Procedure Laterality Date   ARTHOSCOPIC ROTAOR CUFF REPAIR Right 10/03/2019   Procedure: ARTHROSCOPIC ROTATOR CUFF REPAIR;  Surgeon: Tania Ade, MD;  Location: [facility];  Service: Orthopedics;  Laterality: Right;   FRACTURE SURGERY Right 1990s   SHOULDER ARTHROSCOPY WITH SUBACROMIAL DECOMPRESSION Right 10/03/2019   Procedure: SHOULDER ARTHROSCOPY WITH SUBACROMIAL DECOMPRESSION;  Surgeon: Tania Ade, MD;  Location: [facility];  Service: Orthopedics;  Laterality: Right;    Family  History  Problem Relation Age of Onset   Heart disease Father    Stroke Father    Diabetes Father     Social History   Tobacco Use   Smoking status: Never   Smokeless tobacco: Never  Vaping Use   Vaping Use: Never used  Substance Use Topics   Alcohol use: No   Drug use: No    Prior to Admission medications   Medication Sig Start Date End Date Taking? Authorizing Provider  Dulaglutide (TRULICITY) 3 ZH/0.8MV SOPN Inject 3 mg into the skin once a  week. 05/18/22  Yes Maximiano Coss, NP  lisinopril-hydrochlorothiazide (ZESTORETIC) 10-12.5 MG tablet Take 1 tablet by mouth daily. 05/18/22  Yes Maximiano Coss, NP  metFORMIN (GLUCOPHAGE-XR) 500 MG 24 hr tablet TAKE 2 TABLETS BY MOUTH ONCE DAILY WITH BREAKFAST AND 1 TABLET WITH DINNER 05/18/22  Yes Maximiano Coss, NP  omeprazole (PRILOSEC) 40 MG capsule Take 1 capsule (40 mg total) by mouth daily. 05/18/22  Yes Maximiano Coss, NP  sertraline (ZOLOFT) 100 MG tablet Take 1 tablet (100 mg total) by mouth daily. 05/18/22  Yes Maximiano Coss, NP  Dulaglutide 1.5 MG/0.5ML SOPN Inject 1.5 mg into the skin once a week. Patient not taking: Reported on 06/10/2022 05/18/22   Maximiano Coss, NP  fluticasone [facility]) 50 MCG/ACT nasal spray Place 2 sprays into both nostrils daily. Patient not taking: Reported on 06/10/2022 05/18/22   Maximiano Coss, NP  nystatin (MYCOSTATIN) 100000 UNIT/ML suspension Take 5 mLs (500,000 Units total) by mouth 4 (four) times daily. Patient not taking: Reported on 06/10/2022 08/31/21   Maximiano Coss, NP  ondansetron (ZOFRAN-ODT) 4 MG disintegrating tablet Take 1 tablet (4 mg total) by mouth every 8 (eight) hours as needed for nausea or vomiting. Patient not taking: Reported on 06/10/2022 09/06/21   Maximiano Coss, NP  rosuvastatin (CRESTOR) 20 MG tablet Take 1 tablet (20 mg total) by mouth daily. Patient not taking: Reported on 06/10/2022 05/18/22   Maximiano Coss, NP    Current Facility-Administered Medications  Medication Dose Route Frequency Provider Last Rate Last Admin   acetaminophen (TYLENOL) tablet 650 mg  650 mg Oral Q6H PRN Reubin Milan, MD       Or   acetaminophen (TYLENOL) suppository 650 mg  650 mg Rectal Q6H PRN Reubin Milan, MD       [START ON 07/09/2022] cefTRIAXone (ROCEPHIN) 2 g in sodium chloride 0.9 % 100 mL IVPB  2 g Intravenous Q24H Reubin Milan, MD       enoxaparin (LOVENOX) injection 70 mg  70 mg Subcutaneous Q24H Reubin Milan,  MD   70 mg at 06/10/22 2158   lisinopril (ZESTRIL) tablet 10 mg  10 mg Oral Daily Reubin Milan, MD   10 mg at 06/11/22 0913   And   hydrochlorothiazide (HYDRODIURIL) tablet 12.5 mg  12.5 mg Oral Daily Reubin Milan, MD   12.5 mg at 06/11/22 0913   HYDROmorphone (DILAUDID) injection 1 mg  1 mg Intravenous Q4H PRN Reubin Milan, MD       insulin aspart (novoLOG) injection 0-5 Units  0-5 Units Subcutaneous QHS Kc, Ramesh, MD       insulin aspart (novoLOG) injection 0-9 Units  0-9 Units Subcutaneous TID WC Kc, Maren [place], MD   1 Units at 06/11/22 1210   metroNIDAZOLE (FLAGYL) IVPB 500 mg  500 mg Intravenous Q12H Reubin Milan, MD 100 mL/hr at 06/11/22 0915 500 mg at 06/11/22 0915   ondansetron (ZOFRAN) tablet 4  mg  4 mg Oral Q6H PRN Reubin Milan, MD       Or   ondansetron [facility]) injection 4 mg  4 mg Intravenous Q6H PRN Reubin Milan, MD       pantoprazole (PROTONIX) EC tablet 40 mg  40 mg Oral QHS Robertson, Crystal S, [place]       sertraline (ZOLOFT) tablet 100 mg  100 mg Oral Daily Reubin Milan, MD   100 mg at 06/11/22 0912    Allergies as of 06/10/2022   (No Known Allergies)     Review of Systems:    This is positive for those things mentioned in the HPI, All other review of systems are negative.       Physical Exam:  Vital signs in last 24 hours: Temp:  [98.4 F (36.9 C)-99.4 F (37.4 C)] 98.4 F (36.9 C) (08/12 0927) Pulse Rate:  [79-88] 85 (08/12 0927) Resp:  [16-24] 16 (08/12 0927) BP: (113-160)/(61-84) 140/84 (08/12 0927) SpO2:  [95 %-99 %] 95 % (08/12 0927) Last BM Date : 06/10/22  General:  Obese Well-developed, well-nourished and in no acute distress Eyes:  anicteric. Lungs: Clear to auscultation bilaterally. Heart:  S1S2, no rubs, murmurs, gallops. Abdomen:  Obese soft,  mildly tender LLQ, deep palpation no hernia, or mass and BS+.   Neuro:  A&O x 3.  Psych:  appropriate mood and  Affect.   Data Reviewed:   LAB  RESULTS: Recent Labs    06/10/22 0732 06/11/22 0416  WBC 23.0* 17.3*  HGB 12.5* 11.8*  HCT 37.1* 36.3*  PLT 465* 444*   BMET Recent Labs    06/10/22 0732 06/11/22 0416  NA 137 140  K 3.4* 3.7  CL 105 107  CO2 24 25  GLUCOSE 170* 111*  BUN 15 10  CREATININE 0.80 0.80  CALCIUM 8.9 8.7*   LFT Recent Labs    06/11/22 0416  PROT 7.0  ALBUMIN 3.1*  AST 15  ALT 22  ALKPHOS 80  BILITOT 0.7    STUDIES: CT Abdomen Pelvis W Contrast  Result Date: 06/10/2022 CLINICAL DATA:  Acute left lower quadrant abdominal pain. EXAM: CT ABDOMEN AND PELVIS WITH CONTRAST TECHNIQUE: Multidetector CT imaging of the abdomen and pelvis was performed using the standard protocol following bolus administration of intravenous contrast. RADIATION DOSE REDUCTION: This exam was performed according to the departmental dose-optimization program which includes automated exposure control, adjustment of the mA and/or kV according to patient size and/or use of iterative reconstruction technique. CONTRAST:  128m OMNIPAQUE IOHEXOL 300 MG/ML  SOLN COMPARISON:  November 23, 2019. FINDINGS: Lower chest: No acute abnormality. Hepatobiliary: No gallstones or biliary dilatation is noted. Hepatic steatosis. Pancreas: Unremarkable. No pancreatic ductal dilatation or surrounding inflammatory changes. Spleen: Mild splenomegaly is noted. Adrenals/Urinary Tract: Stable left adrenal adenoma. Right adrenal gland appears normal. No hydronephrosis or renal obstruction is noted. No renal or ureteral calculi are noted. Urinary bladder is unremarkable. Stomach/Bowel: The stomach and appendix are unremarkable. There is no evidence of bowel obstruction. Severe proximal sigmoid diverticulitis is noted without definite abscess formation. Vascular/Lymphatic: No significant vascular findings are present. 12 mm left common iliac lymph node is noted which is slightly enlarged compared to prior exam but most likely is inflammatory or reactive in  etiology. Grossly stable periaortic adenopathy is also noted which most likely is reactive or inflammatory in etiology. Reproductive: Prostate is unremarkable. Other: No abdominal wall hernia or abnormality. No abdominopelvic ascites. Musculoskeletal: No acute or significant osseous findings.  IMPRESSION: Severe proximal sigmoid diverticulitis is noted. No definite abscess formation is noted. Hepatic steatosis. Mild splenomegaly. Electronically Signed   By: Marijo Conception M.D.   On: 06/10/2022 08:44       Thanks   LOS: 1 day   '@Shamira Toutant'$  Simonne Maffucci, MD, [facility] @  06/11/2022, 1:13 PM

## 2022-06-11 NOTE — Progress Notes (Signed)
Reached out to Dr Lupita Leash, because pt was asking about increasing diet. See new order entered by MD.

## 2022-06-11 NOTE — Progress Notes (Signed)
PROGRESS NOTE MOROCCO GIPE  ZOX:[id number] DOB: 29-Jul-1971 DOA: 06/10/2022 PCP: Maximiano Coss, NP   Brief Narrative/Hospital Course: 51 y.o.m w/ medical history significant of anxiety, type 2 diabetes, GERD, diverticulosis, history of diverticulitis, hypertension, hypersomnolence with sleep apnea-not using CPAP presented to the ED with abdominal pain since Saturday associated with constipation and mild malaise, took Ex-Lax 8/10 which relievedhis constipation. In ED-UA-proteinuria 30 mg deciliter and rare bacteria microscopic examination.  Leukocytosis wbc 23.0, hemoglobin 12.5 g/dL platelets 165.  Lipase was 26 units/L.  CMP showed a potassium of 3.4 mmol/L, glucose 170 mg deciliter and an albumin 3.3 g/dL.CT abdomen/pelvis with contrast showed severe proximal sigmoid diverticulitis, hepatic asteatosis and mild splenomegaly.  Patient admitted on IV antibiotics IV fluids.     Subjective: Seen and examined this morning Feels better. Wants to go home.  Assessment and Plan: Principal Problem:   Acute diverticulitis Active Problems:   Hypertension associated with diabetes ([place])   GERD (gastroesophageal reflux disease)   Obesity, Class III, BMI 40-49.9 (morbid obesity) (HCC)   Hypersomnia with sleep apnea   Uncontrolled type 2 diabetes mellitus with hyperglycemia ([facility])   Hypokalemia   Hepatic steatosis   Acute severe diverticulitis proximal sigmoid diverticulitis: With leukocytosis, CT finding as above.  Leukocytosis downtrending. Pain improving, cont CLD and ADAT. Continue with IV fluids antiemetics analgesics ceftriaxone Flagyl. He never had colonoscopy, he sees [facility] pcp.  Hypertension:Controlled.on hctz/acei. GERD: On PPI  Uncontrolled t2dm: Hold metformin, add SSI 0-9 Hypokalemia/hypomagnesemia:resolved Hepatic steatosis: In the setting of obesity mrecommended with close PCP follow-up Class III Obesity:Patient's Body mass index is 43.4 kg/m. : Will benefit with PCP  follow-up, weight loss  healthy lifestyle and outpatient sleep evaluation.Morbid obesity.OSA- not on CPAP  DVT prophylaxis:  Code Status:   Code Status: Full Code Family Communication: plan of care discussed with patient at bedside. Patient status is: Inpatient because of management of acute diverticulitis Level of care: Med-Surg   Dispo: The patient is from: home            Anticipated disposition: home in 1-2 days   Mobility Assessment (last 72 hours)     Mobility Assessment     Row Name 06/10/22 2150 06/10/22 1448         Does patient have an order for bedrest or is patient medically unstable No - Continue assessment No - Continue assessment      What is the highest level of mobility based on the progressive mobility assessment? Level 6 (Walks independently in room and hall) - Balance while walking in room without assist - Complete Level 6 (Walks independently in room and hall) - Balance while walking in room without assist - Complete                Objective: Vitals last 24 hrs: Vitals:   06/10/22 1832 06/10/22 2041 06/11/22 0150 06/11/22 0553  BP: (!) 160/74 128/61 127/67 113/62  Pulse: 79 88 84 82  Resp: '18 18 17 18  '$ Temp: 98.6 F (37 C) 98.7 F (37.1 C) 99.4 F (37.4 C) 98.7 F (37.1 C)  TempSrc: Oral Oral Oral Oral  SpO2: 97% 96% 95% 95%  Weight:      Height:       Weight change:   Physical Examination:  General exam: alert awake,older than stated age, weak appearing. HEENT:Oral mucosa moist, Ear/Nose WNL grossly, dentition normal. Respiratory system: bilaterally diminished BS, no use of accessory muscle Cardiovascular system: S1 & S2 +, No JVD. Gastrointestinal system:  Abdomen soft,NT,ND, BS+ Nervous System:Alert, awake, moving extremities and grossly nonfocal Extremities: LE edema neg,distal peripheral pulses palpable.  Skin: No rashes,no icterus. MSK: Normal muscle bulk,tone, power  Medications reviewed:  Scheduled Meds:  enoxaparin (LOVENOX)  injection  70 mg Subcutaneous Q24H   lisinopril  10 mg Oral Daily   And   hydrochlorothiazide  12.5 mg Oral Daily   insulin aspart  0-5 Units Subcutaneous QHS   insulin aspart  0-9 Units Subcutaneous TID WC   pantoprazole (PROTONIX) IV  40 mg Intravenous Q24H   sertraline  100 mg Oral Daily   Continuous Infusions:  [START ON 07/09/2022] cefTRIAXone (ROCEPHIN)  IV     metronidazole 500 mg (06/10/22 2157)      Diet Order             Diet clear liquid Room service appropriate? Yes; Fluid consistency: Thin  Diet effective now                            Intake/Output Summary (Last 24 hours) at 06/11/2022 0859 Last data filed at 06/11/2022 0400 Gross per 24 hour  Intake 1943.5 ml  Output 925 ml  Net 1018.5 ml   Net IO Since Admission: 1,018.5 mL [06/11/22 0859]  Wt Readings from Last 3 Encounters:  06/10/22 (!) 145.2 kg  05/18/22 (!) 145.4 kg  01/27/22 (!) 150.6 kg     Unresulted Labs (From admission, onward)     Start     Ordered   06/17/22 0500  Creatinine, serum  (enoxaparin (LOVENOX)    CrCl >/= 30 ml/min)  Weekly,   R     Comments: while on enoxaparin therapy    06/10/22 1442   06/11/22 0500  HIV Antibody (routine testing w rflx)  (HIV Antibody (Routine testing w reflex) panel)  Tomorrow morning,   R        06/10/22 1442          Data Reviewed: I have personally reviewed following labs and imaging studies CBC: Recent Labs  Lab 06/10/22 0732 06/11/22 0416  WBC 23.0* 17.3*  HGB 12.5* 11.8*  HCT 37.1* 36.3*  MCV 82.6 84.4  PLT 465* 403*   Basic Metabolic Panel: Recent Labs  Lab 06/10/22 0732 06/11/22 0416  NA 137 140  K 3.4* 3.7  CL 105 107  CO2 24 25  GLUCOSE 170* 111*  BUN 15 10  CREATININE 0.80 0.80  CALCIUM 8.9 8.7*  MG 1.8  --    GFR: Estimated Creatinine Clearance: 159.8 mL/min (by C-G formula based on SCr of 0.8 mg/dL). Liver Function Tests: Recent Labs  Lab 06/10/22 0732 06/11/22 0416  AST 23 15  ALT 28 22  ALKPHOS 89 80   BILITOT 0.6 0.7  PROT 7.1 7.0  ALBUMIN 3.3* 3.1*   Recent Labs  Lab 06/10/22 0732  LIPASE 26   No results for input(s): "AMMONIA" in the last 168 hours. Coagulation Profile: No results for input(s): "INR", "PROTIME" in the last 168 hours. BNP (last 3 results) No results for input(s): "PROBNP" in the last 8760 hours. HbA1C: No results for input(s): "HGBA1C" in the last 72 hours. CBG: Recent Labs  Lab 06/10/22 1746 06/10/22 2149 06/11/22 0738  GLUCAP 102* 124* 114*   Lipid Profile: No results for input(s): "CHOL", "HDL", "LDLCALC", "TRIG", "CHOLHDL", "LDLDIRECT" in the last 72 hours. Thyroid Function Tests: No results for input(s): "TSH", "T4TOTAL", "FREET4", "T3FREE", "THYROIDAB" in the last 72 hours. Sepsis Labs:  No results for input(s): "PROCALCITON", "LATICACIDVEN" in the last 168 hours.  No results found for this or any previous visit (from the past 240 hour(s)).  Antimicrobials: Anti-infectives (From admission, onward)    Start     Dose/Rate Route Frequency Ordered Stop   07/09/22 0800  cefTRIAXone (ROCEPHIN) 2 g in sodium chloride 0.9 % 100 mL IVPB        2 g 200 mL/hr over 30 Minutes Intravenous Every 24 hours 06/10/22 1021     06/10/22 1045  cefTRIAXone (ROCEPHIN) 1 g in sodium chloride 0.9 % 100 mL IVPB       Note to Pharmacy: The patient currently has 145 kg of weight.   1 g 200 mL/hr over 30 Minutes Intravenous  Once 06/10/22 1038 06/10/22 1114   06/10/22 1030  cefTRIAXone (ROCEPHIN) 2 g in sodium chloride 0.9 % 100 mL IVPB  Status:  Discontinued       Note to Pharmacy: The patient currently has 145 kg of weight.   2 g 200 mL/hr over 30 Minutes Intravenous  Once 06/10/22 1021 06/10/22 1038   06/10/22 0930  cefTRIAXone (ROCEPHIN) 1 g in sodium chloride 0.9 % 100 mL IVPB  Status:  Discontinued        1 g 200 mL/hr over 30 Minutes Intravenous Every 24 hours 06/10/22 0925 06/10/22 1021   06/10/22 0930  metroNIDAZOLE (FLAGYL) IVPB 500 mg        500 mg 100  mL/hr over 60 Minutes Intravenous Every 12 hours 06/10/22 0925        Culture/Microbiology No results found for: "SDES", "SPECREQUEST", "CULT", "REPTSTATUS"   Radiology Studies: CT Abdomen Pelvis W Contrast  Result Date: 06/10/2022 CLINICAL DATA:  Acute left lower quadrant abdominal pain. EXAM: CT ABDOMEN AND PELVIS WITH CONTRAST TECHNIQUE: Multidetector CT imaging of the abdomen and pelvis was performed using the standard protocol following bolus administration of intravenous contrast. RADIATION DOSE REDUCTION: This exam was performed according to the departmental dose-optimization program which includes automated exposure control, adjustment of the mA and/or kV according to patient size and/or use of iterative reconstruction technique. CONTRAST:  174m OMNIPAQUE IOHEXOL 300 MG/ML  SOLN COMPARISON:  November 23, 2019. FINDINGS: Lower chest: No acute abnormality. Hepatobiliary: No gallstones or biliary dilatation is noted. Hepatic steatosis. Pancreas: Unremarkable. No pancreatic ductal dilatation or surrounding inflammatory changes. Spleen: Mild splenomegaly is noted. Adrenals/Urinary Tract: Stable left adrenal adenoma. Right adrenal gland appears normal. No hydronephrosis or renal obstruction is noted. No renal or ureteral calculi are noted. Urinary bladder is unremarkable. Stomach/Bowel: The stomach and appendix are unremarkable. There is no evidence of bowel obstruction. Severe proximal sigmoid diverticulitis is noted without definite abscess formation. Vascular/Lymphatic: No significant vascular findings are present. 12 mm left common iliac lymph node is noted which is slightly enlarged compared to prior exam but most likely is inflammatory or reactive in etiology. Grossly stable periaortic adenopathy is also noted which most likely is reactive or inflammatory in etiology. Reproductive: Prostate is unremarkable. Other: No abdominal wall hernia or abnormality. No abdominopelvic ascites. Musculoskeletal:  No acute or significant osseous findings. IMPRESSION: Severe proximal sigmoid diverticulitis is noted. No definite abscess formation is noted. Hepatic steatosis. Mild splenomegaly. Electronically Signed   By: JMarijo ConceptionM.D.   On: 06/10/2022 08:44     LOS: 1 day   RAntonieta Pert MD [facility]  06/11/2022, 8:59 AM

## 2022-06-11 NOTE — Hospital Course (Addendum)
51 y.o.m w/ medical history significant of anxiety, type 2 diabetes, GERD, diverticulosis, history of diverticulitis, hypertension, hypersomnolence with sleep apnea-not using CPAP presented to the ED with abdominal pain since Saturday associated with constipation and mild malaise, took Ex-Lax 8/10 which relievedhis constipation. In ED-UA-proteinuria 30 mg deciliter and rare bacteria microscopic examination.  Leukocytosis wbc 23.0, hemoglobin 12.5 g/dL platelets 165.  Lipase was 26 units/L.  CMP showed a potassium of 3.4 mmol/L, glucose 170 mg deciliter and an albumin 3.3 g/dL.CT abdomen/pelvis with contrast showed severe proximal sigmoid diverticulitis, hepatic asteatosis and mild splenomegaly.  Patient admitted on IV antibiotics IV fluids.

## 2022-06-11 NOTE — Progress Notes (Signed)
Pt said he doesn't wear anything at home and doesn't want to wear cpap here. Pt refused.

## 2022-06-12 DIAGNOSIS — K5792 Diverticulitis of intestine, part unspecified, without perforation or abscess without bleeding: Secondary | ICD-10-CM | POA: Diagnosis not present

## 2022-06-12 LAB — CBC
HCT: 38.9 % — ABNORMAL LOW (ref 39.0–52.0)
Hemoglobin: 12.5 g/dL — ABNORMAL LOW (ref 13.0–17.0)
MCH: 27.2 pg (ref 26.0–34.0)
MCHC: 32.1 g/dL (ref 30.0–36.0)
MCV: 84.7 fL (ref 80.0–100.0)
Platelets: 481 10*3/uL — ABNORMAL HIGH (ref 150–400)
RBC: 4.59 MIL/uL (ref 4.22–5.81)
RDW: 14.8 % (ref 11.5–15.5)
WBC: 16.8 10*3/uL — ABNORMAL HIGH (ref 4.0–10.5)
nRBC: 0 % (ref 0.0–0.2)

## 2022-06-12 LAB — BASIC METABOLIC PANEL
Anion gap: 8 (ref 5–15)
BUN: 13 mg/dL (ref 6–20)
CO2: 27 mmol/L (ref 22–32)
Calcium: 9 mg/dL (ref 8.9–10.3)
Chloride: 104 mmol/L (ref 98–111)
Creatinine, Ser: 0.71 mg/dL (ref 0.61–1.24)
GFR, Estimated: >60 mL/min (ref >60)
Glucose, Bld: 121 mg/dL — ABNORMAL HIGH (ref 70–99)
Potassium: 3.8 mmol/L (ref 3.5–5.1)
Sodium: 139 mmol/L (ref 135–145)

## 2022-06-12 LAB — IRON AND TIBC
Iron: 20 ug/dL — ABNORMAL LOW (ref 45–182)
Saturation Ratios: 8 % — ABNORMAL LOW (ref 17.9–39.5)
TIBC: 245 ug/dL — ABNORMAL LOW (ref 250–450)
UIBC: 225 ug/dL

## 2022-06-12 LAB — FERRITIN: Ferritin: 205 ng/mL (ref 24–336)

## 2022-06-12 LAB — GLUCOSE, CAPILLARY: Glucose-Capillary: 122 mg/dL — ABNORMAL HIGH (ref 70–99)

## 2022-06-12 LAB — VITAMIN B12: Vitamin B-12: 196 pg/mL (ref 180–914)

## 2022-06-12 MED ORDER — ONDANSETRON 4 MG PO TBDP: 4.0000 mg | ORAL_TABLET | Freq: Three times a day (TID) | ORAL | 0 refills | Status: DC | PRN

## 2022-06-12 MED ORDER — CYANOCOBALAMIN 1000 MCG/ML IJ SOLN
1000.0000 ug | Freq: Once | INTRAMUSCULAR | Status: DC
  Filled 2022-06-12: qty 1

## 2022-06-12 MED ORDER — FERROUS SULFATE 325 (65 FE) MG PO TABS
325.0000 mg | ORAL_TABLET | Freq: Every day | ORAL | 0 refills | Status: DC
Start: 2022-06-12 — End: 2023-01-09

## 2022-06-12 MED ORDER — VITAMIN B-12 1000 MCG PO TABS: 1000.0000 ug | ORAL_TABLET | Freq: Every day | ORAL | 0 refills | Status: AC

## 2022-06-12 MED ORDER — AMOXICILLIN-POT CLAVULANATE 875-125 MG PO TABS: 1.0000 | ORAL_TABLET | Freq: Two times a day (BID) | ORAL | 0 refills | Status: AC

## 2022-06-12 NOTE — Progress Notes (Signed)
  Transition of Care [facility]) Screening Note   Patient Details  Name: John Mullins Date of Birth: 1970-12-04   Transition of Care [facility]) CM/SW Contact:    Lennart Pall, LCSW Phone Number: 06/12/2022, 9:15 AM    Transition of Care Department [facility]) has reviewed patient and no TOC needs have been identified at this time. We will continue to monitor patient advancement through interdisciplinary progression rounds. If new patient transition needs arise, please place a TOC consult.

## 2022-06-12 NOTE — Discharge Summary (Signed)
Physician Discharge Summary  John Mullins XBJ:[id number] DOB: 1971/05/04 DOA: 06/10/2022  PCP: Maximiano Coss, NP  Admit date: 06/10/2022 Discharge date: 06/12/2022 Recommendations for Outpatient Follow-up:  Follow up with PCP,GI in 1 weeks-call for appointment Please obtain BMP/CBC in one week  Discharge Dispo: HOME Discharge Condition: Stable Code Status:   Code Status: Full Code Diet recommendation:  Diet Order             DIET SOFT Room service appropriate? Yes; Fluid consistency: Thin  Diet effective now                    Brief/Interim Summary: 51 y.o.m w/ medical history significant of anxiety, type 2 diabetes, GERD, diverticulosis, history of diverticulitis, hypertension, hypersomnolence with sleep apnea-not using CPAP presented to the ED with abdominal pain since Saturday associated with constipation and mild malaise, took Ex-Lax 8/10 which relievedhis constipation. In ED-UA-proteinuria 30 mg deciliter and rare bacteria microscopic examination.  Leukocytosis wbc 23.0, hemoglobin 12.5 g/dL platelets 165.  Lipase was 26 units/L.  CMP showed a potassium of 3.4 mmol/L, glucose 170 mg deciliter and an albumin 3.3 g/dL.CT abdomen/pelvis with contrast showed severe proximal sigmoid diverticulitis, hepatic asteatosis and mild splenomegaly.  Patient admitted on IV antibiotics IV fluids.  Patient clinically improved leukocytosis downtrending, at this time tolerating soft diet seen by GI, pain is resolved.  Patient requesting for discharge but getting additional day on 8/12.  GI has seen the patient and cleared the patient for discharge and they will arrange for outpatient follow-up for colonoscopy since he never had 1 discussed the importance of getting it done.  Advised to follow-up PCP in 1 week to check CBC      Discharge Diagnoses:  Principal Problem:   Acute diverticulitis Active Problems:   Hypertension associated with diabetes ([place])   GERD (gastroesophageal reflux disease)    Obesity, Class III, BMI 40-49.9 (morbid obesity) (HCC)   Hypersomnia with sleep apnea   Uncontrolled type 2 diabetes mellitus with hyperglycemia (H[facility])   Hypokalemia   Hepatic steatosis   Normocytic anemia  Acute severe diverticulitis proximal sigmoid diverticulitis: At this time clinically improved pain resolved WC count downtrending afebrile GI has cleared the patient to discharge home on Augmentin twice daily for 10 days and outpatient follow-up for colonoscopy, patient is to see PCP in 1 week to check CBC.  Continue soft diet   Hypertension:Controlled.on hctz/acei. GERD: On PPI Uncontrolled t2dm: Resume metformin at home. Hypokalemia/hypomagnesemia:resolved Hepatic steatosis: In the setting of obesity recommended with close PCP follow-up and weight loss which she is already doing Class III Obesity:Patient's Body mass index is 43.4 kg/m. : Will benefit with PCP follow-up, weight loss  healthy lifestyle and outpatient sleep evaluation.Morbid obesity.OSA- not on CPAP Normocytic anemia likely from chronic disease started on B12 supplement as per GI, also iron supplement Consults: GI Subjective: AAOX3, ON PAIN tolerating diet well  Discharge Exam: Vitals:   06/11/22 2132 06/12/22 0446  BP: 124/65 121/87  Pulse: 87 82  Resp: 18 18  Temp: 98.6 F (37 C) 97.6 F (36.4 C)  SpO2: 97% 95%   General: Pt is alert, awake, not in acute distress Cardiovascular: RRR, S1/S2 +, no rubs, no gallops Respiratory: CTA bilaterally, no wheezing, no rhonchi Abdominal: Soft, NT, ND, bowel sounds + Extremities: no edema, no cyanosis  Discharge Instructions  Discharge Instructions     Discharge instructions   Complete by: As directed    Please call call MD or return to ER for  similar or worsening recurring problem that brought you to hospital or if any fever,nausea/vomiting,abdominal pain, uncontrolled pain, chest pain,  shortness of breath or any other alarming symptoms.  Please follow-up your  doctor as instructed in a week time and call the office for appointment. With primary care doc in 1 wk and need cbc checked.  Please avoid alcohol, smoking, or any other illicit substance and maintain healthy habits including taking your regular medications as prescribed.  You were cared for by a hospitalist during your hospital stay. If you have any questions about your discharge medications or the care you received while you were in the hospital after you are discharged, you can call the unit and ask to speak with the hospitalist on call if the hospitalist that took care of you is not available.  Once you are discharged, your primary care physician will handle any further medical issues. Please note that NO REFILLS for any discharge medications will be authorized once you are discharged, as it is imperative that you return to your primary care physician (or establish a relationship with a primary care physician if you do not have one) for your aftercare needs so that they can reassess your need for medications and monitor your lab values   Increase activity slowly   Complete by: As directed       Allergies as of 06/12/2022   No Known Allergies      Medication List     STOP taking these medications    nystatin 100000 UNIT/ML suspension Commonly known as: MYCOSTATIN       TAKE these medications    amoxicillin-clavulanate 875-125 MG tablet Commonly known as: AUGMENTIN Take 1 tablet by mouth 2 (two) times daily for 10 days.   cyanocobalamin 1000 MCG tablet Commonly known as: VITAMIN B12 Take 1 tablet (1,000 mcg total) by mouth daily.   ferrous sulfate 325 (65 FE) MG tablet Take 1 tablet (325 mg total) by mouth daily with breakfast.   fluticasone 50 MCG/ACT nasal spray Commonly known as: FLONASE Place 2 sprays into both nostrils daily.   lisinopril-hydrochlorothiazide 10-12.5 MG tablet Commonly known as: ZESTORETIC Take 1 tablet by mouth daily.   metFORMIN 500 MG 24 hr  tablet Commonly known as: GLUCOPHAGE-XR TAKE 2 TABLETS BY MOUTH ONCE DAILY WITH BREAKFAST AND 1 TABLET WITH DINNER   omeprazole 40 MG capsule Commonly known as: PRILOSEC Take 1 capsule (40 mg total) by mouth daily.   ondansetron 4 MG disintegrating tablet Commonly known as: ZOFRAN-ODT Take 1 tablet (4 mg total) by mouth every 8 (eight) hours as needed for up to 30 doses for nausea or vomiting.   rosuvastatin 20 MG tablet Commonly known as: Crestor Take 1 tablet (20 mg total) by mouth daily.   sertraline 100 MG tablet Commonly known as: ZOLOFT Take 1 tablet (100 mg total) by mouth daily.   Trulicity 3 DP/8.2UM Sopn Generic drug: Dulaglutide Inject 3 mg into the skin once a week. What changed: Another medication with the same name was removed. Continue taking this medication, and follow the directions you see here.        Follow-up Information     Gatha Mayer, MD Follow up.   Specialty: Gastroenterology Why: Office will contact with appointment Contact information: [address]. [place] [address] [phone]         Maximiano Coss, NP Follow up in 1 week(s).   Specialty: Adult Health Nurse Practitioner Why: check cbc i n1 wk Contact information:  [address] [place] [address] [phone]                No Known Allergies  The results of significant diagnostics from this hospitalization (including imaging, microbiology, ancillary and laboratory) are listed below for reference.    Microbiology: No results found for this or any previous visit (from the past 240 hour(s)).  Procedures/Studies: CT Abdomen Pelvis W Contrast  Result Date: 06/10/2022 CLINICAL DATA:  Acute left lower quadrant abdominal pain. EXAM: CT ABDOMEN AND PELVIS WITH CONTRAST TECHNIQUE: Multidetector CT imaging of the abdomen and pelvis was performed using the standard protocol following bolus administration of intravenous contrast. RADIATION DOSE REDUCTION: This  exam was performed according to the departmental dose-optimization program which includes automated exposure control, adjustment of the mA and/or kV according to patient size and/or use of iterative reconstruction technique. CONTRAST:  133m OMNIPAQUE IOHEXOL 300 MG/ML  SOLN COMPARISON:  November 23, 2019. FINDINGS: Lower chest: No acute abnormality. Hepatobiliary: No gallstones or biliary dilatation is noted. Hepatic steatosis. Pancreas: Unremarkable. No pancreatic ductal dilatation or surrounding inflammatory changes. Spleen: Mild splenomegaly is noted. Adrenals/Urinary Tract: Stable left adrenal adenoma. Right adrenal gland appears normal. No hydronephrosis or renal obstruction is noted. No renal or ureteral calculi are noted. Urinary bladder is unremarkable. Stomach/Bowel: The stomach and appendix are unremarkable. There is no evidence of bowel obstruction. Severe proximal sigmoid diverticulitis is noted without definite abscess formation. Vascular/Lymphatic: No significant vascular findings are present. 12 mm left common iliac lymph node is noted which is slightly enlarged compared to prior exam but most likely is inflammatory or reactive in etiology. Grossly stable periaortic adenopathy is also noted which most likely is reactive or inflammatory in etiology. Reproductive: Prostate is unremarkable. Other: No abdominal wall hernia or abnormality. No abdominopelvic ascites. Musculoskeletal: No acute or significant osseous findings. IMPRESSION: Severe proximal sigmoid diverticulitis is noted. No definite abscess formation is noted. Hepatic steatosis. Mild splenomegaly. Electronically Signed   By: JMarijo ConceptionM.D.   On: 06/10/2022 08:44    Labs: BNP (last 3 results) No results for input(s): "BNP" in the last 8760 hours. Basic Metabolic Panel: Recent Labs  Lab 06/10/22 0732 06/11/22 0416 06/12/22 0505  NA 137 140 139  K 3.4* 3.7 3.8  CL 105 107 104  CO2 '24 25 27  '$ GLUCOSE 170* 111* 121*  BUN '15 10  13  '$ CREATININE 0.80 0.80 0.71  CALCIUM 8.9 8.7* 9.0  MG 1.8  --   --    Liver Function Tests: Recent Labs  Lab 06/10/22 0732 06/11/22 0416  AST 23 15  ALT 28 22  ALKPHOS 89 80  BILITOT 0.6 0.7  PROT 7.1 7.0  ALBUMIN 3.3* 3.1*   Recent Labs  Lab 06/10/22 0732  LIPASE 26   No results for input(s): "AMMONIA" in the last 168 hours. CBC: Recent Labs  Lab 06/10/22 0732 06/11/22 0416 06/12/22 0505  WBC 23.0* 17.3* 16.8*  HGB 12.5* 11.8* 12.5*  HCT 37.1* 36.3* 38.9*  MCV 82.6 84.4 84.7  PLT 465* 444* 481*   Cardiac Enzymes: No results for input(s): "CKTOTAL", "CKMB", "CKMBINDEX", "TROPONINI" in the last 168 hours. BNP: Invalid input(s): "POCBNP" CBG: Recent Labs  Lab 06/11/22 0738 06/11/22 1133 06/11/22 1651 06/11/22 2134 06/12/22 0731  GLUCAP 114* 138* 100* 111* 122*   D-Dimer No results for input(s): "DDIMER" in the last 72 hours. Hgb A1c No results for input(s): "HGBA1C" in the last 72 hours. Lipid Profile No results for input(s): "  CHOL", "HDL", "LDLCALC", "TRIG", "CHOLHDL", "LDLDIRECT" in the last 72 hours. Thyroid function studies No results for input(s): "TSH", "T4TOTAL", "T3FREE", "THYROIDAB" in the last 72 hours.  Invalid input(s): "FREET3" Anemia work up Recent Labs    06/12/22 0505  VITAMINB12 196  FERRITIN 205  TIBC 245*  IRON 20*   Urinalysis    Component Value Date/Time   COLORURINE YELLOW 06/10/2022 [place] 06/10/2022 0958   LABSPEC 1.010 06/10/2022 0958   PHURINE 7.5 06/10/2022 0958   GLUCOSEU NEGATIVE 06/10/2022 0958   GLUCOSEU >=1000 (A) 08/13/2021 1103   HGBUR NEGATIVE 06/10/2022 0958   BILIRUBINUR NEGATIVE 06/10/2022 0958   KETONESUR NEGATIVE 06/10/2022 0958   PROTEINUR 30 (A) 06/10/2022 0958   UROBILINOGEN 0.2 08/13/2021 1103   NITRITE NEGATIVE 06/10/2022 0958   LEUKOCYTESUR NEGATIVE 06/10/2022 0958   Sepsis Labs Recent Labs  Lab 06/10/22 0732 06/11/22 0416 06/12/22 0505  WBC 23.0* 17.3* 16.8*    Microbiology No results found for this or any previous visit (from the past 240 hour(s)).  Time coordinating discharge: 25 minutes  SIGNED: Antonieta Pert, MD  [facility] 06/12/2022, 9:58 AM  If 7PM-7AM, please contact night-coverage [email or web address]

## 2022-06-12 NOTE — Progress Notes (Signed)
Assessment unchanged. Pt verbalized understanding of dc instructions including medications and follow up care. Discharged to front entrance accompanied by NT.

## 2022-06-12 NOTE — Plan of Care (Signed)
Problem: Education: Goal: Knowledge of General Education information will improve Description: Including pain rating scale, medication(s)/side effects and non-pharmacologic comfort measures Outcome: Adequate for Discharge   Problem: Health Behavior/Discharge Planning: Goal: Ability to manage health-related needs will improve Outcome: Adequate for Discharge   Problem: Clinical Measurements: Goal: Ability to maintain clinical measurements within normal limits will improve Outcome: Adequate for Discharge Goal: Will remain free from infection Outcome: Adequate for Discharge Goal: Diagnostic test results will improve Outcome: Adequate for Discharge Goal: Respiratory complications will improve Outcome: Adequate for Discharge Goal: Cardiovascular complication will be avoided Outcome: Adequate for Discharge   Problem: Activity: Goal: Risk for activity intolerance will decrease Outcome: Adequate for Discharge   Problem: Nutrition: Goal: Adequate nutrition will be maintained Outcome: Adequate for Discharge   Problem: Coping: Goal: Level of anxiety will decrease Outcome: Adequate for Discharge   Problem: Elimination: Goal: Will not experience complications related to bowel motility Outcome: Adequate for Discharge Goal: Will not experience complications related to urinary retention Outcome: Adequate for Discharge   Problem: Pain Managment: Goal: General experience of comfort will improve Outcome: Adequate for Discharge   Problem: Safety: Goal: Ability to remain free from injury will improve Outcome: Adequate for Discharge   Problem: Skin Integrity: Goal: Risk for impaired skin integrity will decrease Outcome: Adequate for Discharge   Problem: Education: Goal: Knowledge of General Education information will improve Description: Including pain rating scale, medication(s)/side effects and non-pharmacologic comfort measures Outcome: Adequate for Discharge   Problem: Health  Behavior/Discharge Planning: Goal: Ability to manage health-related needs will improve Outcome: Adequate for Discharge   Problem: Clinical Measurements: Goal: Ability to maintain clinical measurements within normal limits will improve Outcome: Adequate for Discharge Goal: Will remain free from infection Outcome: Adequate for Discharge Goal: Diagnostic test results will improve Outcome: Adequate for Discharge Goal: Respiratory complications will improve Outcome: Adequate for Discharge Goal: Cardiovascular complication will be avoided Outcome: Adequate for Discharge   Problem: Activity: Goal: Risk for activity intolerance will decrease Outcome: Adequate for Discharge   Problem: Nutrition: Goal: Adequate nutrition will be maintained Outcome: Adequate for Discharge   Problem: Coping: Goal: Level of anxiety will decrease Outcome: Adequate for Discharge   Problem: Elimination: Goal: Will not experience complications related to bowel motility Outcome: Adequate for Discharge Goal: Will not experience complications related to urinary retention Outcome: Adequate for Discharge   Problem: Pain Managment: Goal: General experience of comfort will improve Outcome: Adequate for Discharge   Problem: Safety: Goal: Ability to remain free from injury will improve Outcome: Adequate for Discharge   Problem: Skin Integrity: Goal: Risk for impaired skin integrity will decrease Outcome: Adequate for Discharge   Problem: Education: Goal: Ability to describe self-care measures that may prevent or decrease complications (Diabetes Survival Skills Education) will improve Outcome: Adequate for Discharge Goal: Individualized Educational Video(s) Outcome: Adequate for Discharge   Problem: Coping: Goal: Ability to adjust to condition or change in health will improve Outcome: Adequate for Discharge   Problem: Fluid Volume: Goal: Ability to maintain a balanced intake and output will  improve Outcome: Adequate for Discharge   Problem: Health Behavior/Discharge Planning: Goal: Ability to identify and utilize available resources and services will improve Outcome: Adequate for Discharge Goal: Ability to manage health-related needs will improve Outcome: Adequate for Discharge   Problem: Metabolic: Goal: Ability to maintain appropriate glucose levels will improve Outcome: Adequate for Discharge   Problem: Nutritional: Goal: Maintenance of adequate nutrition will improve Outcome: Adequate for Discharge Goal: Progress toward achieving an optimal  weight will improve Outcome: Adequate for Discharge   Problem: Skin Integrity: Goal: Risk for impaired skin integrity will decrease Outcome: Adequate for Discharge   Problem: Tissue Perfusion: Goal: Adequacy of tissue perfusion will improve Outcome: Adequate for Discharge   Problem: Education: Goal: Ability to describe self-care measures that may prevent or decrease complications (Diabetes Survival Skills Education) will improve Outcome: Adequate for Discharge Goal: Individualized Educational Video(s) Outcome: Adequate for Discharge   Problem: Cardiac: Goal: Ability to maintain an adequate cardiac output will improve Outcome: Adequate for Discharge   Problem: Health Behavior/Discharge Planning: Goal: Ability to identify and utilize available resources and services will improve Outcome: Adequate for Discharge Goal: Ability to manage health-related needs will improve Outcome: Adequate for Discharge   Problem: Fluid Volume: Goal: Ability to achieve a balanced intake and output will improve Outcome: Adequate for Discharge   Problem: Metabolic: Goal: Ability to maintain appropriate glucose levels will improve Outcome: Adequate for Discharge   Problem: Nutritional: Goal: Maintenance of adequate nutrition will improve Outcome: Adequate for Discharge Goal: Maintenance of adequate weight for body size and type will  improve Outcome: Adequate for Discharge   Problem: Respiratory: Goal: Will regain and/or maintain adequate ventilation Outcome: Adequate for Discharge   Problem: Urinary Elimination: Goal: Ability to achieve and maintain adequate renal perfusion and functioning will improve Outcome: Adequate for Discharge

## 2022-06-12 NOTE — Progress Notes (Addendum)
   Patient Name: John Mullins Date of Encounter: 06/12/2022, 9:01 AM    Subjective  Eating breakfast without difficulty feels much better less pain   Objective  BP 121/87 (BP Location: Left Arm)   Pulse 82   Temp 97.6 F (36.4 C) (Oral)   Resp 18   Ht 6' (1.829 m)   Wt (!) 145.2 kg   SpO2 95%   BMI 43.40 kg/m  Obese no acute distress Abdomen obese soft minimally tender to deep palpation in the left lower quadrant  Lab Results  Component Value Date   WBC 16.8 (H) 06/12/2022   HGB 12.5 (L) 06/12/2022   HCT 38.9 (L) 06/12/2022   MCV 84.7 06/12/2022   PLT 481 (H) 06/12/2022    Lab Results  Component Value Date   VITAMINB12 196 06/12/2022   Lab Results  Component Value Date   IRON 20 (L) 06/12/2022   TIBC 245 (L) 06/12/2022   FERRITIN 205 06/12/2022     Assessment and Plan  Sigmoid diverticulitis-improved less pain white count declining I think ready for discharge as per my note yesterday   Normocytic anemia iron studies consistent with chronic disease, vitamin B12 is low normal-would start oral B12 1000 mcg daily at discharge and I have ordered to  inject 1000 mcg x 1 today   Gatha Mayer, MD, [facility] See Shea Evans on call - gastroenterology for best contact person 06/12/2022 9:01 AM

## 2022-06-13 ENCOUNTER — Telehealth: Payer: Self-pay

## 2022-06-13 NOTE — Telephone Encounter (Signed)
Pt scheduled for an office Follow up with Vicie Mutters PA 07/18/2022 @ 9:30: Pt made aware:  Pt verbalized understanding with all questions answered.

## 2022-06-13 NOTE — Telephone Encounter (Signed)
-----   Message from Gatha Mayer, MD sent at 06/11/2022  1:39 PM EDT ----- Regarding: needs appy Admitted w/ diverticulitis  Needs f/u appointment APP re: same dx - 4-6 weeks ok

## 2022-07-15 NOTE — Progress Notes (Unsigned)
07/18/2022 John Mullins [id number] 1971/05/31  Referring provider: Maximiano Coss, NP Primary GI doctor: Dr. Carlean Purl  ASSESSMENT AND PLAN:  Assessment: 51 y.o. male here for assessment of the following: 1. Iron deficiency anemia, unspecified iron deficiency anemia type   2. Acute diverticulitis   3. Gastroesophageal reflux disease without esophagitis   4. Hepatic steatosis   5. Morbid obesity due to excess calories ([place])   6. Well controlled type 2 diabetes mellitus ([place])   7. B12 deficiency    Patient with multiple episodes of diverticulitis, has never had colonoscopy.  Found to have IDA during hospital visit and low normal B12.  Has GERD well controlled with PPI, history of hiatal hernia per patient.  On Trulicity, has lost 60 lbs.   Plan: I recommend upper gastrointestinal and colorectal evaluation with an EGD and colonoscopy.  Risk of bowel prep, conscious sedation, and EGD and colonoscopy were discussed.  Risks include but are not limited to dehydration, pain, bleeding, cardiopulmonary process, bowel perforation, or other possible adverse outcomes..  Treatment plan was discussed with patient, and agreed upon.  Suggest getting on iron/B12 and recheck 3-4 months with LFTS and consider electrography, if iron is still low can consider iron infusion.   Lifestyle changes discussed for GERD, avoid NSAIDS, ETOH Continue on the same medication, reports symptoms are well controlled.   Discussed GLP1, trulicity, with the patient, mechanism of action. Patient should be instructed to hold this medications if dose falls within 2 days of endoscopic procedure, due to increased risk of retained gastric contents.  - likely MALFD with diabetes and obestiy, no elevation of LFTS, no thrombocytopenia, he does have mild splenomegaly.  --Continue to work on risk factor modification including diet exercise and control of risk factors including blood sugars. - monitor q 6 months, will  likely benefit from elastography  Patient Care Team: Maximiano Coss, NP as PCP - General (Adult Health Nurse Practitioner)  Blood pressure 128/72, pulse 82, height '6\' 2"'$  (1.88 m), weight (!) 324 lb 6 oz (147.1 kg). BMI Readings from Last 1 Encounters:  07/18/22 41.65 kg/m    HISTORY OF PRESENT ILLNESS: 51 y.o. male with a past medical history of diabetes, GERD, hypertension, sleep apnea, recurrent diverticulitis, IDA and others listed below, presents for hospital follow up.  Patient was in the hospital from 06/10/22 to 06/12/22, was in the hospital for acute sigmoid diverticulitis by CT.    Patient reports several episodes of diverticulitis over the years treated outpatient. Received ceftriaxone and metronidazole in the hospital with improvement of symptoms. Patient has never had colonoscopy, no family history of colon cancer. History of GERD and hiatal hernia with dysphagia, no previous EGD.  Symptoms resolved on PPI. Recent Trulicity has a 60 pound since 2021.  Patient has not had any issues since being out of the hospital.  He states he was eating a lot of salads and green veggies which cause flare.  Has BM 2-3 x a day, no straining, no diarrhea, rare constipation.  Denies AB pain.  Denies melena, hematochezia.  States as long as he takes his PPI daily he does not have any issues with swallowing, but has had previous. Has been diagnosed with hiatal hernia with UGI or barium by description, has never had EGD.  Has iron def anemia with low normal B12.   CBC  06/12/2022  HGB 12.5 MCV 84.7 with normocytic anemia WBC 16.8 Platelets 481 Anemia panel 06/12/2022  Iron 20 Ferritin 205  06/12/2022  B12 196 Kidney function 06/12/2022  BUN 13 Cr 0.71  GFR >60  Potassium 3.8   LFTs 06/11/2022  AST 15 ALT 22 Alkphos 80 TBili 0.7 06/10/2022 LIPASE 26 05/18/2022 TSH 3.19  He  reports that he has never smoked. He has never used smokeless tobacco. He reports that he does not drink alcohol and  does not use drugs.  Current Medications:   Current Outpatient Medications (Endocrine & Metabolic):    Dulaglutide (TRULICITY) 3 KW/4.0XB SOPN, Inject 3 mg into the skin once a week.   metFORMIN (GLUCOPHAGE-XR) 500 MG 24 hr tablet, TAKE 2 TABLETS BY MOUTH ONCE DAILY WITH BREAKFAST AND 1 TABLET WITH DINNER  Current Outpatient Medications (Cardiovascular):    lisinopril-hydrochlorothiazide (ZESTORETIC) 10-12.5 MG tablet, Take 1 tablet by mouth daily.   rosuvastatin (CRESTOR) 20 MG tablet, Take 1 tablet (20 mg total) by mouth daily. (Patient not taking: Reported on 07/18/2022)  Current Outpatient Medications (Respiratory):    fluticasone (FLONASE) 50 MCG/ACT nasal spray, Place 2 sprays into both nostrils daily.   Current Outpatient Medications (Hematological):    ferrous sulfate 325 (65 FE) MG tablet, Take 1 tablet (325 mg total) by mouth daily with breakfast.  Current Outpatient Medications (Other):    omeprazole (PRILOSEC) 40 MG capsule, Take 1 capsule (40 mg total) by mouth daily.   sertraline (ZOLOFT) 100 MG tablet, Take 1 tablet (100 mg total) by mouth daily.   ondansetron (ZOFRAN-ODT) 4 MG disintegrating tablet, Take 1 tablet (4 mg total) by mouth every 8 (eight) hours as needed for up to 30 doses for nausea or vomiting. (Patient not taking: Reported on 07/18/2022)  Medical History:  Past Medical History:  Diagnosis Date   Anxiety    Diabetes mellitus without complication ([place])    Diverticulitis    GERD (gastroesophageal reflux disease)    Hypertension    Sleep apnea    Allergies: No Known Allergies   Surgical History:  He  has a past surgical history that includes Fracture surgery (Right, 1990s); Arthroscopic rotator cuff repair (Right, 10/03/2019); and Shoulder arthroscopy with subacromial decompression (Right, 10/03/2019). Family History:  His family history includes Diabetes in his father and mother; Diverticulitis in his brother and mother; Heart disease in his father;  Stroke in his father.  REVIEW OF SYSTEMS  : All other systems reviewed and negative except where noted in the History of Present Illness.  PHYSICAL EXAM: BP 128/72   Pulse 82   Ht '6\' 2"'$  (1.88 m)   Wt (!) 324 lb 6 oz (147.1 kg)   BMI 41.65 kg/m  General:   Pleasant, morbidly obese male in no acute distress Head:   Normocephalic and atraumatic. Eyes:  sclerae anicteric,conjunctive pink  Heart:   regular rate and rhythm Pulm:  Clear anteriorly; no wheezing Abdomen:   Soft, Obese AB, Active bowel sounds. No tenderness . Without guarding and Without rebound, No organomegaly appreciated. Rectal: Not evaluated Extremities:  Without edema. Msk: Symmetrical without gross deformities. Peripheral pulses intact.  Neurologic:  Alert and  oriented x4;  No focal deficits.  Skin:   Dry and intact without significant lesions or rashes. Psychiatric:  Cooperative. Normal mood and affect.    Vladimir Crofts, PA-C 9:25 AM

## 2022-07-18 ENCOUNTER — Ambulatory Visit: Payer: Managed Care, Other (non HMO) | Admitting: Physician Assistant

## 2022-07-18 ENCOUNTER — Encounter: Payer: Self-pay | Admitting: Physician Assistant

## 2022-07-18 ENCOUNTER — Ambulatory Visit (INDEPENDENT_AMBULATORY_CARE_PROVIDER_SITE_OTHER): Payer: Managed Care, Other (non HMO) | Admitting: Physician Assistant

## 2022-07-18 VITALS — BP 128/72 | HR 82 | Ht 74.0 in | Wt 324.4 lb

## 2022-07-18 DIAGNOSIS — D509 Iron deficiency anemia, unspecified: Secondary | ICD-10-CM | POA: Diagnosis not present

## 2022-07-18 DIAGNOSIS — E119 Type 2 diabetes mellitus without complications: Secondary | ICD-10-CM

## 2022-07-18 DIAGNOSIS — K760 Fatty (change of) liver, not elsewhere classified: Secondary | ICD-10-CM | POA: Diagnosis not present

## 2022-07-18 DIAGNOSIS — K219 Gastro-esophageal reflux disease without esophagitis: Secondary | ICD-10-CM | POA: Diagnosis not present

## 2022-07-18 DIAGNOSIS — E538 Deficiency of other specified B group vitamins: Secondary | ICD-10-CM

## 2022-07-18 DIAGNOSIS — K5792 Diverticulitis of intestine, part unspecified, without perforation or abscess without bleeding: Secondary | ICD-10-CM | POA: Diagnosis not present

## 2022-07-18 NOTE — Patient Instructions (Addendum)
_______________________________________________________  If you are age 52 or older, your body mass index should be between 23-30. Your Body mass index is 41.65 kg/m. If this is out of the aforementioned range listed, please consider follow up with your Primary Care Provider.  If you are age 87 or younger, your body mass index should be between 19-25. Your Body mass index is 41.65 kg/m. If this is out of the aformentioned range listed, please consider follow up with your Primary Care Provider.   ________________________________________________________  The [facility] providers would like to encourage you to use [facility] to communicate with providers for non-urgent requests or questions.  Due to long hold times on the telephone, sending your provider a message by [facility] may be a faster and more efficient way to get a response.  Please allow 48 business hours for a response.  Please remember that this is for non-urgent requests.  _______________________________________________________  John Mullins have been scheduled for an endoscopy and colonoscopy. Please follow the written instructions given to you at your visit today. Please pick up your prep supplies at the pharmacy within the next 1-3 days. If you use inhalers (even only as needed), please bring them with you on the day of your procedure.   Please follow up in 3-4 months. Give [place] a call at [phone] to schedule an appointment.   Getting on multivitamin with iron and B12 If not tolerating oral iron, can discuss iron infusion  Diverticulosis Diverticulosis is a condition that develops when small pouches (diverticula) form in the wall of the large intestine (colon). The colon is where water is absorbed and stool (feces) is formed. The pouches form when the inside layer of the colon pushes through weak spots in the outer layers of the colon. You may have a few pouches or many of them. The pouches usually do not cause problems unless they become  inflamed or infected. When this happens, the condition is called diverticulitis- this is left lower quadrant pain, diarrhea, fever, chills, nausea or vomiting.  If this occurs please call the office or go to the hospital. Sometimes these patches without inflammation can also have painless bleeding associated with them, if this happens please call the office or go to the hospital. Preventing constipation and increasing fiber can help reduce diverticula and prevent complications. Even if you feel you have a high-fiber diet, suggest getting on Benefiber or Cirtracel 2 times daily.  .Metabolic dysfunction associated seatohepatitis  Now the leading cause of liver failure in the [place].  It is normally from such risk factors as obesity, diabetes, insulin resistance, high cholesterol, or metabolic syndrome.  The only definitive therapy is weight loss and exercise.   Suggest walking 20-30 mins daily.  Decreasing carbohydrates, increasing veggies.    Fatty Liver Fatty liver is the accumulation of fat in liver cells. It is also called hepatosteatosis or steatohepatitis. It is normal for your liver to contain some fat. If fat is more than 5 to 10% of your liver's weight, you have fatty liver.  There are often no symptoms (problems) for years while damage is still occurring. People often learn about their fatty liver when they have medical tests for other reasons. Fat can damage your liver for years or even decades without causing problems. When it becomes severe, it can cause fatigue, weight loss, weakness, and confusion. This makes you more likely to develop more serious liver problems. The liver is the largest organ in the body. It does a lot of work and  often gives no warning signs when it is sick until late in a disease. The liver has many important jobs including: Breaking down foods. Storing vitamins, iron, and other minerals. Making proteins. Making bile for food digestion. Breaking down  many products including medications, alcohol and some poisons.  PROGNOSIS  Fatty liver may cause no damage or it can lead to an inflammation of the liver. This is, called steatohepatitis.  Over time the liver may become scarred and hardened. This condition is called cirrhosis. Cirrhosis is serious and may lead to liver failure or cancer. NASH is one of the leading causes of cirrhosis. About 10-20% of Americans have fatty liver and a smaller 2-5% has NASH.  TREATMENT  Weight loss, fat restriction, and exercise in overweight patients produces inconsistent results but is worth trying. Good control of diabetes may reduce fatty liver. Eat a balanced, healthy diet. Increase your physical activity. There are no medical or surgical treatments for a fatty liver or NASH, but improving your diet and increasing your exercise may help prevent or reverse some of the damage.   It was a pleasure to see you today!  Thank you for trusting me with your gastrointestinal care!

## 2022-08-18 ENCOUNTER — Other Ambulatory Visit: Payer: Self-pay

## 2022-08-18 DIAGNOSIS — K219 Gastro-esophageal reflux disease without esophagitis: Secondary | ICD-10-CM

## 2022-08-23 ENCOUNTER — Other Ambulatory Visit: Payer: Self-pay

## 2022-08-23 DIAGNOSIS — K219 Gastro-esophageal reflux disease without esophagitis: Secondary | ICD-10-CM

## 2022-08-23 MED ORDER — OMEPRAZOLE 40 MG PO CPDR: 40.0000 mg | DELAYED_RELEASE_CAPSULE | Freq: Every day | ORAL | 0 refills | Status: DC

## 2022-08-25 ENCOUNTER — Encounter: Payer: Self-pay | Admitting: Family Medicine

## 2022-08-25 ENCOUNTER — Ambulatory Visit (INDEPENDENT_AMBULATORY_CARE_PROVIDER_SITE_OTHER): Payer: Managed Care, Other (non HMO) | Admitting: Family Medicine

## 2022-08-25 VITALS — BP 130/82 | HR 90 | Temp 98.3°F | Resp 18 | Ht 74.0 in | Wt 327.5 lb

## 2022-08-25 DIAGNOSIS — E1159 Type 2 diabetes mellitus with other circulatory complications: Secondary | ICD-10-CM | POA: Diagnosis not present

## 2022-08-25 DIAGNOSIS — E1169 Type 2 diabetes mellitus with other specified complication: Secondary | ICD-10-CM

## 2022-08-25 DIAGNOSIS — E1165 Type 2 diabetes mellitus with hyperglycemia: Secondary | ICD-10-CM

## 2022-08-25 DIAGNOSIS — E785 Hyperlipidemia, unspecified: Secondary | ICD-10-CM

## 2022-08-25 DIAGNOSIS — Z23 Encounter for immunization: Secondary | ICD-10-CM | POA: Diagnosis not present

## 2022-08-25 DIAGNOSIS — I152 Hypertension secondary to endocrine disorders: Secondary | ICD-10-CM

## 2022-08-25 HISTORY — DX: Type 2 diabetes mellitus with other specified complication: E11.69

## 2022-08-25 MED ORDER — METFORMIN HCL 500 MG PO TABS: 500.0000 mg | ORAL_TABLET | Freq: Two times a day (BID) | ORAL | 1 refills | Status: DC

## 2022-08-25 MED ORDER — LISINOPRIL-HYDROCHLOROTHIAZIDE 10-12.5 MG PO TABS: 1.0000 | ORAL_TABLET | Freq: Every day | ORAL | 1 refills | Status: DC

## 2022-08-25 MED ORDER — SERTRALINE HCL 100 MG PO TABS: 100.0000 mg | ORAL_TABLET | Freq: Every day | ORAL | 1 refills | Status: DC

## 2022-08-25 MED ORDER — OMEPRAZOLE 40 MG PO CPDR: 40.0000 mg | DELAYED_RELEASE_CAPSULE | Freq: Every day | ORAL | 1 refills | Status: DC

## 2022-08-25 NOTE — Progress Notes (Signed)
   Subjective:    Patient ID: John Mullins, male    DOB: 1971/01/28, 51 y.o.   MRN: [id number]  HPI DM- chronic problem, on Trulicity '3mg'$  weekly, Metformin '500mg'$  BID.  Due for eye exam, microalbumin.  Foot exam done today.  Rare symptomatic lows if he doesn't eat.    HTN- chronic problem, on Lisinopril HCTZ 10/12.'5mg'$  daily.  No CP, SOB, HAs, visual changes, edema.  Hyperlipidemia- chronic problem, prescribed Crestor '20mg'$  daily but not taking.  No abd pain, N/V.  Obesity- chronic problem.  BMI 42.  Pt has an active job but no formal activity.  Pt states he is not following a low carb diet.  Was previously at 51 and is down nearly 60 lbs over the last year.   Review of Systems For ROS see HPI     Objective:   Physical Exam Vitals reviewed.  Constitutional:      General: He is not in acute distress.    Appearance: Normal appearance. He is well-developed. He is obese. He is not ill-appearing.  HENT:     Head: Normocephalic and atraumatic.  Eyes:     Extraocular Movements: Extraocular movements intact.     Conjunctiva/sclera: Conjunctivae normal.     Pupils: Pupils are equal, round, and reactive to light.  Neck:     Thyroid: No thyromegaly.  Cardiovascular:     Rate and Rhythm: Normal rate and regular rhythm.     Pulses: Normal pulses.     Heart sounds: Normal heart sounds. No murmur heard. Pulmonary:     Effort: Pulmonary effort is normal. No respiratory distress.     Breath sounds: Normal breath sounds.  Abdominal:     General: Bowel sounds are normal. There is no distension.     Palpations: Abdomen is soft.  Musculoskeletal:     Cervical back: Normal range of motion and neck supple.     Right lower leg: No edema.     Left lower leg: No edema.  Lymphadenopathy:     Cervical: No cervical adenopathy.  Skin:    General: Skin is warm and dry.  Neurological:     General: No focal deficit present.     Mental Status: He is alert and oriented to person, place, and time.      Cranial Nerves: No cranial nerve deficit.  Psychiatric:        Mood and Affect: Mood normal.        Behavior: Behavior normal.           Assessment & Plan:

## 2022-08-25 NOTE — Assessment & Plan Note (Signed)
Chronic problem.  Adequate control today on Lisinopril HCTZ 10/12.'5mg'$  daily.  Currently asymptomatic.  Check labs due to ACE and diuretic use but no anticipated med changes.

## 2022-08-25 NOTE — Assessment & Plan Note (Signed)
Pt is down 60 lbs in the last year w/ the help of Trulicity.  He is not following a low carb diet or getting regular exercise.  Encouraged both.

## 2022-08-25 NOTE — Assessment & Plan Note (Signed)
Chronic problem.  Pt was prescribed Crestor but is not taking.  Check labs and determine if statin is needed.  LDL goal is <70 due to DM.

## 2022-08-25 NOTE — Assessment & Plan Note (Signed)
Chronic problem.  On Trulicity '3mg'$  weekly and Metformin '500mg'$  BID.  Due for eye exam and microalbumin (ordered).  Foot exam done today.  Check labs.  Adjust meds prn

## 2022-08-25 NOTE — Patient Instructions (Addendum)
Schedule an appt w/ Di Kindle in late January/February to recheck sugar We'll notify you of your lab results and make any changes if needed Continue to work on low carb/low sugar diet and get regular physical activity Call and schedule your eye exam and have them send [place] a copy Call with any questions or concerns Stay Safe!  Stay Healthy! Happy Fall!!!

## 2022-08-26 LAB — HEPATIC FUNCTION PANEL
ALT: 28 U/L (ref 0–53)
AST: 20 U/L (ref 0–37)
Albumin: 4.5 g/dL (ref 3.5–5.2)
Alkaline Phosphatase: 94 U/L (ref 39–117)
Bilirubin, Direct: 0.1 mg/dL (ref 0.0–0.3)
Total Bilirubin: 0.4 mg/dL (ref 0.2–1.2)
Total Protein: 7.4 g/dL (ref 6.0–8.3)

## 2022-08-26 LAB — CBC WITH DIFFERENTIAL/PLATELET
Basophils Absolute: 0.2 10*3/uL — ABNORMAL HIGH (ref 0.0–0.1)
Basophils Relative: 1.1 % (ref 0.0–3.0)
Eosinophils Absolute: 0.2 10*3/uL (ref 0.0–0.7)
Eosinophils Relative: 1.6 % (ref 0.0–5.0)
HCT: 41.4 % (ref 39.0–52.0)
Hemoglobin: 13.3 g/dL (ref 13.0–17.0)
Lymphocytes Relative: 16.1 % (ref 12.0–46.0)
Lymphs Abs: 2.3 10*3/uL (ref 0.7–4.0)
MCHC: 32.2 g/dL (ref 30.0–36.0)
MCV: 85.2 fl (ref 78.0–100.0)
Monocytes Absolute: 0.8 10*3/uL (ref 0.1–1.0)
Monocytes Relative: 5.8 % (ref 3.0–12.0)
Neutro Abs: 10.9 10*3/uL — ABNORMAL HIGH (ref 1.4–7.7)
Neutrophils Relative %: 75.4 % (ref 43.0–77.0)
Platelets: 355 10*3/uL (ref 150.0–400.0)
RBC: 4.85 Mil/uL (ref 4.22–5.81)
RDW: 16.3 % — ABNORMAL HIGH (ref 11.5–15.5)
WBC: 14.5 10*3/uL — ABNORMAL HIGH (ref 4.0–10.5)

## 2022-08-26 LAB — BASIC METABOLIC PANEL
BUN: 20 mg/dL (ref 6–23)
CO2: 26 mEq/L (ref 19–32)
Calcium: 10.2 mg/dL (ref 8.4–10.5)
Chloride: 104 mEq/L (ref 96–112)
Creatinine, Ser: 0.96 mg/dL (ref 0.40–1.50)
GFR: 91.06 mL/min (ref >60.00)
Glucose, Bld: 107 mg/dL — ABNORMAL HIGH (ref 70–99)
Potassium: 3.9 mEq/L (ref 3.5–5.1)
Sodium: 139 mEq/L (ref 135–145)

## 2022-08-26 LAB — TSH: TSH: 2.12 u[IU]/mL (ref 0.35–5.50)

## 2022-08-26 LAB — LIPID PANEL
Cholesterol: 177 mg/dL (ref 0–200)
HDL: 37.4 mg/dL — ABNORMAL LOW (ref >39.00)
LDL Cholesterol: 108 mg/dL — ABNORMAL HIGH (ref 0–99)
NonHDL: 139.4
Total CHOL/HDL Ratio: 5
Triglycerides: 156 mg/dL — ABNORMAL HIGH (ref 0.0–149.0)
VLDL: 31.2 mg/dL (ref 0.0–40.0)

## 2022-08-26 LAB — HEMOGLOBIN A1C: Hgb A1c MFr Bld: 6.3 % (ref 4.6–6.5)

## 2022-09-02 ENCOUNTER — Ambulatory Visit: Payer: Managed Care, Other (non HMO) | Admitting: Internal Medicine

## 2022-09-02 ENCOUNTER — Encounter: Payer: Self-pay | Admitting: Internal Medicine

## 2022-09-02 VITALS — BP 116/78 | HR 70 | Temp 98.3°F | Resp 14 | Ht 74.0 in | Wt 324.0 lb

## 2022-09-02 DIAGNOSIS — D125 Benign neoplasm of sigmoid colon: Secondary | ICD-10-CM

## 2022-09-02 DIAGNOSIS — K222 Esophageal obstruction: Secondary | ICD-10-CM

## 2022-09-02 DIAGNOSIS — K317 Polyp of stomach and duodenum: Secondary | ICD-10-CM

## 2022-09-02 DIAGNOSIS — D123 Benign neoplasm of transverse colon: Secondary | ICD-10-CM | POA: Diagnosis not present

## 2022-09-02 DIAGNOSIS — D509 Iron deficiency anemia, unspecified: Secondary | ICD-10-CM | POA: Diagnosis present

## 2022-09-02 DIAGNOSIS — K5792 Diverticulitis of intestine, part unspecified, without perforation or abscess without bleeding: Secondary | ICD-10-CM

## 2022-09-02 DIAGNOSIS — K219 Gastro-esophageal reflux disease without esophagitis: Secondary | ICD-10-CM

## 2022-09-02 MED ORDER — SODIUM CHLORIDE 0.9 % IV SOLN: 500.0000 mL | Freq: Once | INTRAVENOUS | Status: DC

## 2022-09-02 NOTE — Patient Instructions (Addendum)
Handouts provided about diverticulosis, hiatal hernia, gastritis and polyps.   Resume previous diet.  Continue present medications.  Await pathology results.  Repeat colonoscopy is recommended.  The colonoscopy date will be determined after pathology results from today's exam become available for review.    YOU HAD AN ENDOSCOPIC PROCEDURE TODAY AT [facility]:   Refer to the procedure report that was given to you for any specific questions about what was found during the examination.  If the procedure report does not answer your questions, please call your gastroenterologist to clarify.  If you requested that your care partner not be given the details of your procedure findings, then the procedure report has been included in a sealed envelope for you to review at your convenience later.  YOU SHOULD EXPECT: Some feelings of bloating in the abdomen. Passage of more gas than usual.  Walking can help get rid of the air that was put into your GI tract during the procedure and reduce the bloating. If you had a lower endoscopy (such as a colonoscopy or flexible sigmoidoscopy) you may notice spotting of blood in your stool or on the toilet paper. If you underwent a bowel prep for your procedure, you may not have a normal bowel movement for a few days.  Please Note:  You might notice some irritation and congestion in your nose or some drainage.  This is from the oxygen used during your procedure.  There is no need for concern and it should clear up in a day or so.  SYMPTOMS TO REPORT IMMEDIATELY:  Following lower endoscopy (colonoscopy or flexible sigmoidoscopy):  Excessive amounts of blood in the stool  Significant tenderness or worsening of abdominal pains  Swelling of the abdomen that is new, acute  Fever of 100F or higher  Following upper endoscopy (EGD)  Vomiting of blood or coffee ground material  New chest pain or pain under the shoulder blades  Painful or persistently difficult  swallowing  New shortness of breath  Fever of 100F or higher  Black, tarry-looking stools  For urgent or emergent issues, a gastroenterologist can be reached at any hour by calling [phone]. Do not use MyChart messaging for urgent concerns.    DIET:  We do recommend a small meal at first, but then you may proceed to your regular diet.  Drink plenty of fluids but you should avoid alcoholic beverages for 24 hours.  ACTIVITY:  You should plan to take it easy for the rest of today and you should NOT DRIVE or use heavy machinery until tomorrow (because of the sedation medicines used during the test).    FOLLOW UP: Our staff will call the number listed on your records the next business day following your procedure.  We will call around 7:15- 8:00 am to check on you and address any questions or concerns that you may have regarding the information given to you following your procedure. If we do not reach you, we will leave a message.     If any biopsies were taken you will be contacted by phone or by letter within the next 1-3 weeks.  Please call us at [phone] if you have not heard about the biopsies in 3 weeks.    SIGNATURES/CONFIDENTIALITY: You and/or your care partner have signed paperwork which will be entered into your electronic medical record.  These signatures attest to the fact that that the information above on your After Visit Summary has been reviewed and is understood.  Full responsibility of the confidentiality of this discharge information lies with you and/or your care-partner.I took stomach biopsies - ? Gastritis (inflammation) checking for infection. Also sampled some tiny stomach polyps that look innocent.  There was some scar tissue where esophagus and stomach meet - I stretched and disrupted this to reduce chance of swallowing problems.  Colonoscopy had two tiny polyps (removed) and the diverticulosis but no diverticulitis.  No signs of cancer.  I will let you  know pathology results and when to have another routine colonoscopy by mail and/or My Chart.  I appreciate the opportunity to care for you. Gatha Mayer, MD, [facility]

## 2022-09-02 NOTE — Op Note (Signed)
[place] Patient Name: John Mullins Procedure Date: 09/02/2022 2:05 PM MRN: [id number] Endoscopist: Gatha Mayer , MD, [id number] Age: 51 Referring MD:  Date of Birth: 1971/03/21 Gender: Male Account #: [id number] Procedure:                Upper GI endoscopy Indications:              Iron deficiency anemia, Suspected gastro-esophageal                            reflux disease Medicines:                Monitored Anesthesia Care Procedure:                Pre-Anesthesia Assessment:                           - Prior to the procedure, a History and Physical                            was performed, and patient medications and                            allergies were reviewed. The patient's tolerance of                            previous anesthesia was also reviewed. The risks                            and benefits of the procedure and the sedation                            options and risks were discussed with the patient.                            All questions were answered, and informed consent                            was obtained. Prior Anticoagulants: The patient has                            taken no anticoagulant or antiplatelet agents. ASA                            Grade Assessment: III - A patient with severe                            systemic disease. After reviewing the risks and                            benefits, the patient was deemed in satisfactory                            condition to undergo the procedure.  After obtaining informed consent, the endoscope was                            passed under direct vision. Throughout the                            procedure, the patient's blood pressure, pulse, and                            oxygen saturations were monitored continuously. The                            GIF [id number] #[id number] was introduced through the                            mouth, and advanced to the second part  of duodenum.                            The upper GI endoscopy was accomplished without                            difficulty. The patient tolerated the procedure                            well. Scope In: Scope Out: Findings:                 One benign-appearing, intrinsic moderate                            (circumferential scarring or stenosis; an endoscope                            may pass) stenosis was found at the                            gastroesophageal junction. The stenosis was                            traversed. A TTS dilator was passed through the                            scope. Dilation with an 18-19-20 mm balloon dilator                            was performed to 20 mm. The dilation site was                            examined and showed no change. biopsy forceps                            disruption followed Estimated blood loss was                            minimal.  A 3 cm hiatal hernia was present.                           Multiple diminutive sessile polyps were found in                            the gastric fundus and in the gastric body.                            Biopsies were taken with a cold forceps for                            histology. Verification of patient identification                            for the specimen was done. Estimated blood loss was                            minimal. Small amount of fibrous food material also                            seen. Did not limit exam.                           Diffuse moderately erythematous mucosa without                            bleeding was found in the gastric antrum. Biopsies                            were taken with a cold forceps for histology.                            Verification of patient identification for the                            specimen was done. Estimated blood loss was minimal.                           The examined duodenum was normal.                            The cardia and gastric fundus were otherwsise                            normal on retroflexion. Complications:            No immediate complications. Estimated Blood Loss:     Estimated blood loss was minimal. Impression:               - Benign-appearing esophageal stenosis. Dilated.                           - 3 cm hiatal hernia.                           -  Multiple gastric polyps. Biopsied.                           - Erythematous mucosa in the antrum. Biopsied.                           - Normal examined duodenum. Recommendation:           - Patient has a contact number available for                            emergencies. The signs and symptoms of potential                            delayed complications were discussed with the                            patient. Return to normal activities tomorrow.                            Written discharge instructions were provided to the                            patient.                           - Resume previous diet.                           - Continue present medications.                           - Await pathology results.                           - See the other procedure note for documentation of                            additional recommendations. Gatha Mayer, MD 09/02/2022 3:00:00 PM This report has been signed electronically.

## 2022-09-02 NOTE — Progress Notes (Signed)
[facility] History and Physical   Primary Care Physician:  Maximiano Coss, NP   Reason for Procedure:   Iron deficiency anemia  Plan:    EGD and colonoscopy     HPI: John Mullins is a 51 y.o. male w/ hx recurrent diverticulitis and iron deficiency anemia. Seen in 9/23 by Vicie Mutters PA-C and scheduled for this evaluation.   Past Medical History:  Diagnosis Date   Anxiety    Diabetes mellitus without complication ([place])    Diverticulitis    GERD (gastroesophageal reflux disease)    Hypertension    Sleep apnea     Past Surgical History:  Procedure Laterality Date   ARTHOSCOPIC ROTAOR CUFF REPAIR Right 10/03/2019   Procedure: ARTHROSCOPIC ROTATOR CUFF REPAIR;  Surgeon: Tania Ade, MD;  Location: [facility];  Service: Orthopedics;  Laterality: Right;   FRACTURE SURGERY Right 1990s   SHOULDER ARTHROSCOPY WITH SUBACROMIAL DECOMPRESSION Right 10/03/2019   Procedure: SHOULDER ARTHROSCOPY WITH SUBACROMIAL DECOMPRESSION;  Surgeon: Tania Ade, MD;  Location: [facility];  Service: Orthopedics;  Laterality: Right;    Prior to Admission medications   Medication Sig Start Date End Date Taking? Authorizing Provider  Dulaglutide (TRULICITY) 3 TF/5.7DU SOPN Inject 3 mg into the skin once a week. 05/18/22   Maximiano Coss, NP  ferrous sulfate 325 (65 FE) MG tablet Take 1 tablet (325 mg total) by mouth daily with breakfast. 06/12/22 07/12/22  Antonieta Pert, MD  fluticasone (FLONASE) 50 MCG/ACT nasal spray Place 2 sprays into both nostrils daily. 05/18/22   Maximiano Coss, NP  lisinopril-hydrochlorothiazide (ZESTORETIC) 10-12.5 MG tablet Take 1 tablet by mouth daily. 08/25/22   Midge Minium, MD  metFORMIN (GLUCOPHAGE) 500 MG tablet Take 1 tablet (500 mg total) by mouth 2 (two) times daily with a meal. 08/25/22   Midge Minium, MD  omeprazole (PRILOSEC) 40 MG capsule Take 1 capsule (40 mg total) by mouth daily. 08/25/22   Midge Minium, MD  sertraline (ZOLOFT)  100 MG tablet Take 1 tablet (100 mg total) by mouth daily. 08/25/22   Midge Minium, MD    Current Outpatient Medications  Medication Sig Dispense Refill   lisinopril-hydrochlorothiazide (ZESTORETIC) 10-12.5 MG tablet Take 1 tablet by mouth daily. 90 tablet 1   metFORMIN (GLUCOPHAGE) 500 MG tablet Take 1 tablet (500 mg total) by mouth 2 (two) times daily with a meal. 180 tablet 1   omeprazole (PRILOSEC) 40 MG capsule Take 1 capsule (40 mg total) by mouth daily. 90 capsule 1   sertraline (ZOLOFT) 100 MG tablet Take 1 tablet (100 mg total) by mouth daily. 90 tablet 1   Dulaglutide (TRULICITY) 3 KG/2.5KY SOPN Inject 3 mg into the skin once a week. 6 mL 3   ferrous sulfate 325 (65 FE) MG tablet Take 1 tablet (325 mg total) by mouth daily with breakfast. 30 tablet 0   fluticasone (FLONASE) 50 MCG/ACT nasal spray Place 2 sprays into both nostrils daily. (Patient not taking: Reported on 09/02/2022) 16 g 6   Current Facility-Administered Medications  Medication Dose Route Frequency Provider Last Rate Last Admin   0.9 %  sodium chloride infusion  500 mL Intravenous Once Gatha Mayer, MD        Allergies as of 09/02/2022   (No Known Allergies)    Family History  Problem Relation Age of Onset   Diverticulitis Mother    Diabetes Mother    Heart disease Father    Stroke Father    Diabetes Father  Diverticulitis Brother    Colon cancer Neg Hx    Esophageal cancer Neg Hx    Rectal cancer Neg Hx    Stomach cancer Neg Hx     Social History   Socioeconomic History   Marital status: Married    Spouse name: Not on file   Number of children: 1   Years of education: Not on file   Highest education level: Not on file  Occupational History   Occupation: Aeronautical engineer  Tobacco Use   Smoking status: Never   Smokeless tobacco: Never  Vaping Use   Vaping Use: Never used  Substance and Sexual Activity   Alcohol use: No   Drug use: No   Sexual activity: Yes  Other Topics Concern    Not on file  Social History Narrative   Married   Employed as an Clinical biochemist   Never smoker no alcohol or drugs   Social Determinants of Radio broadcast assistant Strain: Not on file  Food Insecurity: Not on file  Transportation Needs: Not on file  Physical Activity: Not on file  Stress: Not on file  Social Connections: Not on file  Intimate Partner Violence: Not on file    Review of Systems:  All other review of systems negative except as mentioned in the HPI.  Physical Exam: Vital signs BP (!) 134/50   Pulse 86   Temp 98.3 F (36.8 C) (Skin)   Ht '6\' 2"'$  (1.88 m)   Wt (!) 324 lb (147 kg)   SpO2 95%   BMI 41.60 kg/m   General:   Alert,  Well-developed, well-nourished, pleasant and cooperative in NAD Lungs:  Clear throughout to auscultation.   Heart:  Regular rate and rhythm; no murmurs, clicks, rubs,  or gallops. Abdomen:  Soft, nontender and nondistended. Normal bowel sounds.   Neuro/Psych:  Alert and cooperative. Normal mood and affect. A and O x 3   '@[place]'$  Simonne Maffucci, MD, [facility] [phone] (pager) 09/02/2022 2:04 PM@

## 2022-09-02 NOTE — Progress Notes (Signed)
Cell phone off per pt  

## 2022-09-02 NOTE — Progress Notes (Signed)
Called to room to assist during endoscopic procedure.  Patient ID and intended procedure confirmed with present staff. Received instructions for my participation in the procedure from the performing physician.  

## 2022-09-02 NOTE — Progress Notes (Signed)
A and O x3. Report to RN. Tolerated MAC anesthesia well.Teeth unchanged after procedure. 

## 2022-09-02 NOTE — Op Note (Signed)
[place] Patient Name: John Mullins Procedure Date: 09/02/2022 1:58 PM MRN: [id number] Endoscopist: Gatha Mayer , MD, [id number] Age: 51 Referring MD:  Date of Birth: September 14, 1971 Gender: Male Account #: [id number] Procedure:                Colonoscopy Indications:              Iron deficiency anemia Medicines:                Monitored Anesthesia Care Procedure:                Pre-Anesthesia Assessment:                           - Prior to the procedure, a History and Physical                            was performed, and patient medications and                            allergies were reviewed. The patient's tolerance of                            previous anesthesia was also reviewed. The risks                            and benefits of the procedure and the sedation                            options and risks were discussed with the patient.                            All questions were answered, and informed consent                            was obtained. Prior Anticoagulants: The patient has                            taken no anticoagulant or antiplatelet agents. ASA                            Grade Assessment: III - A patient with severe                            systemic disease. After reviewing the risks and                            benefits, the patient was deemed in satisfactory                            condition to undergo the procedure.                           After obtaining informed consent, the colonoscope  was passed under direct vision. Throughout the                            procedure, the patient's blood pressure, pulse, and                            oxygen saturations were monitored continuously. The                            Olympus CF-HQ190L (Serial# [id number]) Colonoscope was                            introduced through the anus and advanced to the the                            cecum, identified by appendiceal  orifice and                            ileocecal valve. The colonoscopy was performed                            without difficulty. The patient tolerated the                            procedure well. The quality of the bowel                            preparation was good. The ileocecal valve,                            appendiceal orifice, and rectum were photographed. Scope In: 2:30:01 PM Scope Out: 2:48:22 PM Scope Withdrawal Time: 0 hours 14 minutes 44 seconds  Total Procedure Duration: 0 hours 18 minutes 21 seconds  Findings:                 The perianal and digital rectal examinations were                            normal. Pertinent negatives include normal prostate                            (size, shape, and consistency).                           Two sessile polyps were found in the sigmoid colon                            and transverse colon. The polyps were diminutive in                            size. These polyps were removed with a cold snare.                            Resection and retrieval were complete. Verification  of patient identification for the specimen was                            done. Estimated blood loss was minimal.                           Many diverticula were found in the sigmoid colon.                           The exam was otherwise without abnormality on                            direct and retroflexion views. Complications:            No immediate complications. Estimated Blood Loss:     Estimated blood loss was minimal. Impression:               - Two diminutive polyps in the sigmoid colon and in                            the transverse colon, removed with a cold snare.                            Resected and retrieved.                           - Diverticulosis in the sigmoid colon.                           - The examination was otherwise normal on direct                            and retroflexion  views. Recommendation:           - Patient has a contact number available for                            emergencies. The signs and symptoms of potential                            delayed complications were discussed with the                            patient. Return to normal activities tomorrow.                            Written discharge instructions were provided to the                            patient.                           - Resume previous diet.                           - Continue present medications. Stay on iron  supplementation                           - Repeat colonoscopy is recommended. The                            colonoscopy date will be determined after pathology                            results from today's exam become available for                            review. Gatha Mayer, MD 09/02/2022 3:02:47 PM This report has been signed electronically.

## 2022-09-05 ENCOUNTER — Telehealth: Payer: Self-pay

## 2022-09-05 NOTE — Telephone Encounter (Signed)
  Follow up Call-     09/02/2022    1:23 PM  Call back number  Post procedure Call Back phone  # [phone]  Permission to leave phone message Yes     Patient questions:  Do you have a fever, pain , or abdominal swelling? No. Pain Score  0 *  Have you tolerated food without any problems? Yes.    Have you been able to return to your normal activities? Yes.    Do you have any questions about your discharge instructions: Diet   No. Medications  No. Follow up visit  No.  Do you have questions or concerns about your Care? No.  Actions: * If pain score is 4 or above: No action needed, pain <4.

## 2022-09-13 ENCOUNTER — Encounter: Payer: Self-pay | Admitting: Internal Medicine

## 2022-09-13 DIAGNOSIS — Z860101 Personal history of adenomatous and serrated colon polyps: Secondary | ICD-10-CM

## 2022-09-13 DIAGNOSIS — Z86010 Personal history of colonic polyps: Secondary | ICD-10-CM

## 2022-09-13 HISTORY — DX: Personal history of adenomatous and serrated colon polyps: Z86.0101

## 2022-11-15 ENCOUNTER — Other Ambulatory Visit: Payer: Self-pay | Admitting: Registered Nurse

## 2022-11-15 ENCOUNTER — Other Ambulatory Visit (HOSPITAL_BASED_OUTPATIENT_CLINIC_OR_DEPARTMENT_OTHER): Payer: Self-pay

## 2022-12-07 ENCOUNTER — Other Ambulatory Visit (HOSPITAL_BASED_OUTPATIENT_CLINIC_OR_DEPARTMENT_OTHER): Payer: Self-pay

## 2022-12-08 ENCOUNTER — Other Ambulatory Visit (HOSPITAL_BASED_OUTPATIENT_CLINIC_OR_DEPARTMENT_OTHER): Payer: Self-pay

## 2022-12-12 ENCOUNTER — Other Ambulatory Visit (HOSPITAL_BASED_OUTPATIENT_CLINIC_OR_DEPARTMENT_OTHER): Payer: Self-pay

## 2022-12-13 ENCOUNTER — Other Ambulatory Visit (HOSPITAL_BASED_OUTPATIENT_CLINIC_OR_DEPARTMENT_OTHER): Payer: Self-pay

## 2022-12-15 ENCOUNTER — Other Ambulatory Visit (HOSPITAL_BASED_OUTPATIENT_CLINIC_OR_DEPARTMENT_OTHER): Payer: Self-pay

## 2022-12-19 ENCOUNTER — Other Ambulatory Visit (HOSPITAL_BASED_OUTPATIENT_CLINIC_OR_DEPARTMENT_OTHER): Payer: Self-pay

## 2022-12-20 ENCOUNTER — Other Ambulatory Visit (HOSPITAL_BASED_OUTPATIENT_CLINIC_OR_DEPARTMENT_OTHER): Payer: Self-pay

## 2022-12-22 ENCOUNTER — Other Ambulatory Visit (HOSPITAL_BASED_OUTPATIENT_CLINIC_OR_DEPARTMENT_OTHER): Payer: Self-pay

## 2022-12-26 ENCOUNTER — Other Ambulatory Visit (HOSPITAL_BASED_OUTPATIENT_CLINIC_OR_DEPARTMENT_OTHER): Payer: Self-pay

## 2022-12-29 ENCOUNTER — Other Ambulatory Visit (HOSPITAL_BASED_OUTPATIENT_CLINIC_OR_DEPARTMENT_OTHER): Payer: Self-pay

## 2023-01-09 ENCOUNTER — Encounter: Payer: Self-pay | Admitting: Family Medicine

## 2023-01-09 ENCOUNTER — Ambulatory Visit (INDEPENDENT_AMBULATORY_CARE_PROVIDER_SITE_OTHER): Payer: Managed Care, Other (non HMO) | Admitting: Family Medicine

## 2023-01-09 ENCOUNTER — Other Ambulatory Visit (HOSPITAL_BASED_OUTPATIENT_CLINIC_OR_DEPARTMENT_OTHER): Payer: Self-pay

## 2023-01-09 VITALS — BP 144/84 | HR 96 | Temp 98.4°F | Ht 74.0 in | Wt 350.0 lb

## 2023-01-09 DIAGNOSIS — I152 Hypertension secondary to endocrine disorders: Secondary | ICD-10-CM

## 2023-01-09 DIAGNOSIS — E1159 Type 2 diabetes mellitus with other circulatory complications: Secondary | ICD-10-CM | POA: Diagnosis not present

## 2023-01-09 DIAGNOSIS — E119 Type 2 diabetes mellitus without complications: Secondary | ICD-10-CM

## 2023-01-09 DIAGNOSIS — E785 Hyperlipidemia, unspecified: Secondary | ICD-10-CM

## 2023-01-09 DIAGNOSIS — R7989 Other specified abnormal findings of blood chemistry: Secondary | ICD-10-CM | POA: Diagnosis not present

## 2023-01-09 DIAGNOSIS — E1169 Type 2 diabetes mellitus with other specified complication: Secondary | ICD-10-CM

## 2023-01-09 DIAGNOSIS — F411 Generalized anxiety disorder: Secondary | ICD-10-CM | POA: Diagnosis not present

## 2023-01-09 DIAGNOSIS — Z7689 Persons encountering health services in other specified circumstances: Secondary | ICD-10-CM

## 2023-01-09 DIAGNOSIS — K219 Gastro-esophageal reflux disease without esophagitis: Secondary | ICD-10-CM

## 2023-01-09 MED ORDER — LISINOPRIL-HYDROCHLOROTHIAZIDE 10-12.5 MG PO TABS: 1.0000 | ORAL_TABLET | Freq: Every day | ORAL | 1 refills | Status: DC

## 2023-01-09 MED ORDER — SERTRALINE HCL 100 MG PO TABS: 100.0000 mg | ORAL_TABLET | Freq: Every day | ORAL | 1 refills | Status: DC

## 2023-01-09 MED ORDER — TRULICITY 4.5 MG/0.5ML ~~LOC~~ SOAJ: 4.5000 mg | SUBCUTANEOUS | 0 refills | Status: DC

## 2023-01-09 MED ORDER — METFORMIN HCL 500 MG PO TABS: 500.0000 mg | ORAL_TABLET | Freq: Two times a day (BID) | ORAL | 1 refills | Status: DC

## 2023-01-09 MED ORDER — OMEPRAZOLE 40 MG PO CPDR: 40.0000 mg | DELAYED_RELEASE_CAPSULE | Freq: Every day | ORAL | 1 refills | Status: DC

## 2023-01-09 NOTE — Assessment & Plan Note (Signed)
Continue with Prilosec for GERD symptoms. It is effective with no complications. Refilled medication.

## 2023-01-09 NOTE — Assessment & Plan Note (Signed)
Currently not taking any medication. He was previously on Rosuvastatin, but discontinued due to leg pain. Unable to draw fasting lipid level. Patient reports he ate a Whopper today for lunch at noon. Recommend patient be fasting at his next 6 month physical.

## 2023-01-09 NOTE — Assessment & Plan Note (Signed)
Blood pressure is not controlled based off this visit. Systolic is elevated. His last 2 blood pressure readings in office have been good. Recommend patient to monitor his blood pressure and call back with readings or send a message through MyChart in 2 weeks of his BP readings. If elevated at home, will need to increase Lisinopril to '20mg'$  and keep HCTZ at 12.'5mg'$ . Refilled medication. Ordered CMP to assess kidney function and potassium.

## 2023-01-09 NOTE — Assessment & Plan Note (Signed)
Discontinue Trulicity '3mg'$  per injection. Increased Trulicity to 4.'5mg'$  per weekly injection. Informed patient if he had complications with the increase dose, to call the office or send a message through [place]. Continue with Metformin. Refilled Metformin. Ordered microalbumin/creatinine urine ratio. Ordered A1c.

## 2023-01-09 NOTE — Assessment & Plan Note (Signed)
>>  ASSESSMENT AND PLAN FOR ANXIETY STATE WRITTEN ON 01/09/2023  5:20 PM BY Zandra Abts R, NP  Continue with Sertraline since it is effective with no complications. Refilled medication.

## 2023-01-09 NOTE — Assessment & Plan Note (Signed)
Continue with Sertraline since it is effective with no complications. Refilled medication.

## 2023-01-09 NOTE — Assessment & Plan Note (Addendum)
Discontinue Trulicity '3mg'$  per injection. Increased Trulicity to 4.'5mg'$  per weekly injection. Informed patient if he had complications with the increase dose, to call the office or send a message through [place]. Continue with Metformin. Refilled Metformin. Ordered microalbumin/creatinine urine ratio. Ordered A1c. Strongly encouraged patient have a diabetic eye exam by ophthalmologist. Foot exam is UTD, will need at physical exam.

## 2023-01-09 NOTE — Patient Instructions (Addendum)
It was a pleasure to meet you today and I look forward to taking care of you.  1.Strongly recommend a diabetic eye exam by ophthalmologist.  2.Discontinue Trulicity '3mg'$  per injection. START Trulicity 4.'5mg'$  per injection weekly.  3.Ordered labs today. Office will call with results and you may see your results on MyChart. 4. Refilled Metformin, Lisinopril/Hydrochlorothiazide, Omeprazole, and Sertraline.  5. Follow up in 6 months for physical. Please be fasting. Only water and black coffee 6 hours before visit.

## 2023-01-09 NOTE — Progress Notes (Signed)
New Patient Office Visit  Subjective    Patient ID: John Mullins, male    DOB: 07-24-1971  Age: 52 y.o. MRN: [id number]  CC:  Chief Complaint  Patient presents with   Establish Care    Pt is here today to [facility]. Pt would like refills refills today. Pt would like to discuss diabetes meds.Pt is taking Trulicity for 1 year.    HPI John Mullins presents to establish care with new provider and chronic management.   Diabetes: Chronic. Patient is taking Trulicity '3mg'$  per injection and Metformin '500mg'$ , 1 tablet twice a day. He reports he has been on medication for about 1 year. He reports he has lost 40Ibs, gained some back during the holidays. He denies that he monitors his blood sugar at home. Needs eye exam. Foot exam is UTD. Denies polyuria, polydipsia, and polyphagia. Denies chest pain or shortness of breath. He reports if he does not eat for long period of time, he will become nausea. Resolved by eating. Diet: Decreased soft drinks to 3-4 12-16oz a day from drinking a 2L or more soda. He reports he has tried diet drinks and does not like the taste of them. Summertime, he will drink water, coconut water, and Lipton Tea. He has decreased sweets. 1-2 times a week-loaded carb meal. He reports he does not eat a lot of salads due to diverticulitis. 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 Lab Results  Component Value Date   HGBA1C 6.3 08/25/2022    HTN: Chronic. Patient is taking Lisinopril/HCTZ 10/12.'5mg'$  daily. He does not monitor his blood pressure at home. Denies CP, SHOB, headaches, dizziness, lightheadedness, or lower extremity edema.  BP Readings from Last 3 Encounters:  01/09/23 (!) 144/84  09/02/22 116/78  08/25/22 130/82    GERD: Chronic. Prilosec '40mg'$  daily. It is effective with no complications. He reports when he eats red meat and breads, food would want to come back up.   Hyperlipidemia: Chronic. Patient was  prescribed Rosuvastatin '20mg'$  daily. He reports he stopped taking it years ago because of severe leg pain. He denies seeing a cardiologist.   Anxiety: Chronic. Patient is prescribed Zoloft '100mg'$  daily. He reports it is effective with no complications. He does not have any more outburst as previously. Denies SI and HI.   Outpatient Encounter Medications as of 01/09/2023  Medication Sig   Dulaglutide (TRULICITY) 4.5 0000000 SOPN Inject 4.5 mg as directed once a week.   ondansetron (ZOFRAN-ODT) 4 MG disintegrating tablet    [DISCONTINUED] Dulaglutide (TRULICITY) 3 0000000 SOPN Inject 3 mg into the skin once a week.   [DISCONTINUED] lisinopril-hydrochlorothiazide (ZESTORETIC) 10-12.5 MG tablet Take 1 tablet by mouth daily.   [DISCONTINUED] metFORMIN (GLUCOPHAGE) 500 MG tablet Take 1 tablet (500 mg total) by mouth 2 (two) times daily with a meal.   [DISCONTINUED] omeprazole (PRILOSEC) 40 MG capsule Take 1 capsule (40 mg total) by mouth daily.   [DISCONTINUED] sertraline (ZOLOFT) 100 MG tablet Take 1 tablet (100 mg total) by mouth daily.   lisinopril-hydrochlorothiazide (ZESTORETIC) 10-12.5 MG tablet Take 1 tablet by mouth daily.   metFORMIN (GLUCOPHAGE) 500 MG tablet Take 1 tablet (500 mg total) by mouth 2 (two) times daily with a meal.   omeprazole (PRILOSEC) 40 MG capsule Take 1 capsule (40 mg total) by mouth daily.   sertraline (ZOLOFT) 100 MG tablet Take 1 tablet (100 mg total) by mouth daily.   [DISCONTINUED] ferrous sulfate 325 (65 FE)  MG tablet Take 1 tablet (325 mg total) by mouth daily with breakfast.   [DISCONTINUED] fluticasone (FLONASE) 50 MCG/ACT nasal spray Place 2 sprays into both nostrils daily. (Patient not taking: Reported on 09/02/2022)   [DISCONTINUED] metFORMIN (GLUCOPHAGE-XR) 500 MG 24 hr tablet  (Patient not taking: Reported on 01/09/2023)   [DISCONTINUED] OZEMPIC, 2 MG/DOSE, 8 MG/3ML SOPN Inject 2 mg into the skin once a week. (Patient not taking: Reported on 01/09/2023)    [DISCONTINUED] rosuvastatin (CRESTOR) 20 MG tablet Take by mouth. (Patient not taking: Reported on 01/09/2023)   No facility-administered encounter medications on file as of 01/09/2023.    Past Medical History:  Diagnosis Date   Anxiety    Diabetes mellitus without complication ([place])    Diverticulitis    GERD (gastroesophageal reflux disease)    Hx of adenomatous polyp of colon 09/13/2022   Single diminutive adenoma recall 2030   Hypertension    Sleep apnea    No CPap    Past Surgical History:  Procedure Laterality Date   ARTHOSCOPIC ROTAOR CUFF REPAIR Right 10/03/2019   Procedure: ARTHROSCOPIC ROTATOR CUFF REPAIR;  Surgeon: Tania Ade, MD;  Location: [facility];  Service: Orthopedics;  Laterality: Right;   FRACTURE SURGERY Right 1990s   SHOULDER ARTHROSCOPY WITH SUBACROMIAL DECOMPRESSION Right 10/03/2019   Procedure: SHOULDER ARTHROSCOPY WITH SUBACROMIAL DECOMPRESSION;  Surgeon: Tania Ade, MD;  Location: [facility];  Service: Orthopedics;  Laterality: Right;    Family History  Problem Relation Age of Onset   Diverticulitis Mother    Diabetes Mother    Glaucoma Mother    Heart disease Father    Stroke Father    Diabetes Father    Diverticulitis Brother    Colon cancer Neg Hx    Esophageal cancer Neg Hx    Rectal cancer Neg Hx    Stomach cancer Neg Hx     Social History   Socioeconomic History   Marital status: Married    Spouse name: Not on file   Number of children: 1   Years of education: Not on file   Highest education level: High school graduate  Occupational History   Occupation: Aeronautical engineer   Occupation: Psychologist, prison and probation services  Tobacco Use   Smoking status: Never   Smokeless tobacco: Never  Vaping Use   Vaping Use: Never used  Substance and Sexual Activity   Alcohol use: No   Drug use: No   Sexual activity: Yes  Other Topics Concern   Not on file  Social History Narrative   Married-lives with spouse   Employed as an Clinical biochemist   Never smoker no alcohol or  drugs   2 dogs   Social Determinants of Radio broadcast assistant Strain: Not on file  Food Insecurity: Not on file  Transportation Needs: Not on file  Physical Activity: Not on file  Stress: Not on file  Social Connections: Not on file  Intimate Partner Violence: Not on file    ROS See HPI above    Objective    BP (!) 144/84   Pulse 96   Temp 98.4 F (36.9 C)   Ht '6\' 2"'$  (1.88 m)   Wt (!) 350 lb (158.8 kg)   SpO2 96%   BMI 44.94 kg/m   Physical Exam Vitals reviewed.  Constitutional:      General: He is not in acute distress.    Appearance: Normal appearance. He is obese. He is not ill-appearing or toxic-appearing.  Eyes:     General:  Right eye: No discharge.        Left eye: No discharge.     Conjunctiva/sclera: Conjunctivae normal.  Cardiovascular:     Rate and Rhythm: Normal rate and regular rhythm.     Heart sounds: Normal heart sounds. No murmur heard.    No friction rub. No gallop.  Pulmonary:     Effort: Pulmonary effort is normal. No respiratory distress.     Breath sounds: Normal breath sounds.  Musculoskeletal:        General: Normal range of motion.  Skin:    General: Skin is warm and dry.  Neurological:     General: No focal deficit present.     Mental Status: He is alert and oriented to person, place, and time. Mental status is at baseline.  Psychiatric:        Mood and Affect: Mood normal.        Behavior: Behavior normal.        Thought Content: Thought content normal.        Judgment: Judgment normal.      Assessment & Plan:  Diabetes mellitus without complication (HCC) Assessment & Plan: Discontinue Trulicity '3mg'$  per injection. Increased Trulicity to 4.'5mg'$  per weekly injection. Informed patient if he had complications with the increase dose, to call the office or send a message through [place]. Continue with Metformin. Refilled Metformin. Ordered microalbumin/creatinine urine ratio. Ordered A1c. Strongly encouraged patient have  a diabetic eye exam by ophthalmologist. Foot exam is UTD, will need at physical exam.   Orders: -     Microalbumin / creatinine urine ratio -     Trulicity; Inject 4.5 mg as directed once a week.  Dispense: 6 mL; Refill: 0 -     metFORMIN HCl; Take 1 tablet (500 mg total) by mouth 2 (two) times daily with a meal.  Dispense: 180 tablet; Refill: 1 -     Hemoglobin A1c  Gastroesophageal reflux disease without esophagitis Assessment & Plan: Continue with Prilosec for GERD symptoms. It is effective with no complications. Refilled medication.   Orders: -     Omeprazole; Take 1 capsule (40 mg total) by mouth daily.  Dispense: 90 capsule; Refill: 1  Anxiety state Assessment & Plan: Continue with Sertraline since it is effective with no complications. Refilled medication.  Orders: -     Sertraline HCl; Take 1 tablet (100 mg total) by mouth daily.  Dispense: 90 tablet; Refill: 1  Hypertension associated with diabetes ([place]) Assessment & Plan: Blood pressure is not controlled based off this visit. Systolic is elevated. His last 2 blood pressure readings in office have been good. Recommend patient to monitor his blood pressure and call back with readings or send a message through MyChart in 2 weeks of his BP readings. If elevated at home, will need to increase Lisinopril to '20mg'$  and keep HCTZ at 12.'5mg'$ . Refilled medication. Ordered CMP to assess kidney function and potassium.   Orders: -     Lisinopril-hydroCHLOROthiazide; Take 1 tablet by mouth daily.  Dispense: 90 tablet; Refill: 1 -     Comprehensive metabolic panel  Hyperlipidemia associated with type 2 diabetes mellitus ([place]) Assessment & Plan: Currently not taking any medication. He was previously on Rosuvastatin, but discontinued due to leg pain. Unable to draw fasting lipid level. Patient reports he ate a Whopper today for lunch at noon. Recommend patient be fasting at his next 6 month physical.    Abnormal CBC -     CBC with  Differential/Platelet  Establishing care with new doctor, encounter for  1.WBC was increased with his last CBC in October. Ordered CBC to re-evaluate.  Lab Results  Component Value Date   WBC 14.5 (H) 08/25/2022   HGB 13.3 08/25/2022   HCT 41.4 08/25/2022   MCV 85.2 08/25/2022   PLT 355.0 08/25/2022    Return in about 6 months (around 07/12/2023) for physical.   Valarie Merino, NP

## 2023-01-10 ENCOUNTER — Telehealth: Payer: Self-pay

## 2023-01-10 LAB — COMPREHENSIVE METABOLIC PANEL
ALT: 30 U/L (ref 0–53)
AST: 19 U/L (ref 0–37)
Albumin: 4.3 g/dL (ref 3.5–5.2)
Alkaline Phosphatase: 107 U/L (ref 39–117)
BUN: 16 mg/dL (ref 6–23)
CO2: 27 mEq/L (ref 19–32)
Calcium: 9.9 mg/dL (ref 8.4–10.5)
Chloride: 104 mEq/L (ref 96–112)
Creatinine, Ser: 0.83 mg/dL (ref 0.40–1.50)
GFR: 100.57 mL/min (ref >60.00)
Glucose, Bld: 90 mg/dL (ref 70–99)
Potassium: 4.2 mEq/L (ref 3.5–5.1)
Sodium: 140 mEq/L (ref 135–145)
Total Bilirubin: 0.3 mg/dL (ref 0.2–1.2)
Total Protein: 7.3 g/dL (ref 6.0–8.3)

## 2023-01-10 LAB — CBC WITH DIFFERENTIAL/PLATELET
Basophils Absolute: 0.1 10*3/uL (ref 0.0–0.1)
Basophils Relative: 0.6 % (ref 0.0–3.0)
Eosinophils Absolute: 0.2 10*3/uL (ref 0.0–0.7)
Eosinophils Relative: 1.5 % (ref 0.0–5.0)
HCT: 41.5 % (ref 39.0–52.0)
Hemoglobin: 13.8 g/dL (ref 13.0–17.0)
Lymphocytes Relative: 28.9 % (ref 12.0–46.0)
Lymphs Abs: 3.6 10*3/uL (ref 0.7–4.0)
MCHC: 33.2 g/dL (ref 30.0–36.0)
MCV: 83.2 fl (ref 78.0–100.0)
Monocytes Absolute: 0.8 10*3/uL (ref 0.1–1.0)
Monocytes Relative: 6.3 % (ref 3.0–12.0)
Neutro Abs: 7.8 10*3/uL — ABNORMAL HIGH (ref 1.4–7.7)
Neutrophils Relative %: 62.7 % (ref 43.0–77.0)
Platelets: 367 10*3/uL (ref 150.0–400.0)
RBC: 4.98 Mil/uL (ref 4.22–5.81)
RDW: 15.5 % (ref 11.5–15.5)
WBC: 12.5 10*3/uL — ABNORMAL HIGH (ref 4.0–10.5)

## 2023-01-10 LAB — HEMOGLOBIN A1C: Hgb A1c MFr Bld: 6.5 % (ref 4.6–6.5)

## 2023-01-10 NOTE — Telephone Encounter (Signed)
-----   Message from Evangeline Gula, NP sent at 01/10/2023 11:59 AM EDT ----- WBC is mildly elevated, but improved from previous labs in October 2023. A1c has increased since previous visit, still at goal though. CMP is normal.

## 2023-01-10 NOTE — Telephone Encounter (Signed)
Attempted to reach pt no answer and no VM set up

## 2023-01-16 ENCOUNTER — Telehealth: Payer: Self-pay

## 2023-01-16 NOTE — Telephone Encounter (Signed)
Called and pts vm is not set up at this time.

## 2023-01-16 NOTE — Progress Notes (Signed)
I have reached out to this patient today and his voice mail was not set up. Unable to leave message.    John Mullins

## 2023-01-16 NOTE — Progress Notes (Signed)
Called patient and left voice mail to call office.   [place]  [phone]

## 2023-01-16 NOTE — Telephone Encounter (Signed)
-----   Message from Evangeline Gula, NP sent at 01/16/2023  8:02 AM EDT ----- Can you please call patient to see if he he has his blood pressure readings. He was elevated, 144/84?, on his previous visit. Also, I don't have his microalbumin creatinine/ratio back. Uncertain if he left a urine that day. Ty ----- Message ----- From: SYSTEM Sent: 01/14/2023  12:12 AM EDT To: Evangeline Gula, NP

## 2023-01-16 NOTE — Telephone Encounter (Signed)
Spoke with pt and he was able to get B/P cuff today. Pt was not able to leave a urine last visit. Sch.pt to bring reading and get urine

## 2023-01-16 NOTE — Telephone Encounter (Signed)
Left vm for pt to call office 

## 2023-01-16 NOTE — Progress Notes (Signed)
Called patient and was unable to leave voicemail

## 2023-01-24 ENCOUNTER — Ambulatory Visit (INDEPENDENT_AMBULATORY_CARE_PROVIDER_SITE_OTHER): Payer: Managed Care, Other (non HMO) | Admitting: Family Medicine

## 2023-01-24 ENCOUNTER — Encounter: Payer: Self-pay | Admitting: Family Medicine

## 2023-01-24 VITALS — BP 132/82 | HR 95 | Temp 98.7°F | Ht 74.0 in | Wt 343.5 lb

## 2023-01-24 DIAGNOSIS — I152 Hypertension secondary to endocrine disorders: Secondary | ICD-10-CM | POA: Diagnosis not present

## 2023-01-24 DIAGNOSIS — E1159 Type 2 diabetes mellitus with other circulatory complications: Secondary | ICD-10-CM | POA: Diagnosis not present

## 2023-01-24 DIAGNOSIS — E119 Type 2 diabetes mellitus without complications: Secondary | ICD-10-CM

## 2023-01-24 MED ORDER — TRULICITY 4.5 MG/0.5ML ~~LOC~~ SOAJ
4.5000 mg | SUBCUTANEOUS | 0 refills | Status: DC
  Filled 2023-02-14: qty 2, 28d supply, fill #0
  Filled 2023-03-09: qty 2, 28d supply, fill #1
  Filled 2023-04-05 – 2023-04-13 (×2): qty 2, 28d supply, fill #2

## 2023-01-24 MED ORDER — LISINOPRIL-HYDROCHLOROTHIAZIDE 20-12.5 MG PO TABS: 1.0000 | ORAL_TABLET | Freq: Every day | ORAL | 0 refills | Status: DC

## 2023-01-24 NOTE — Assessment & Plan Note (Signed)
Ordered Microalbumin/Creatinine Urine Ratio. Refilled Trulicity 4.5mg  injection for a 3 month supply. Will send his information for to schedule for a diabetic exam for May 15th in office at [facility].

## 2023-01-24 NOTE — Progress Notes (Signed)
Established Patient Office Visit   Subjective:  Patient ID: John Mullins, male    DOB: 21-Jan-1971  Age: 52 y.o. MRN: [id number]  Chief Complaint  Patient presents with   Follow-up    Pt is here today to F/U on B/P. Pt has reading with him today.     HPI HTN: Chronic. On previous visit, patients blood pressure was elevated,  144/84. He reports he had not took his blood pressure medication. He brought in his blood pressure readings since previous visit. He had 6 readings, range 132-146/84-90. Over half of the readings were either >/= 140/90. Denies chest pain, SHOB, headaches, dizziness, lightheadedness, or lower extremity edema.   BP Readings from Last 3 Encounters:  01/24/23 132/82  01/09/23 (!) 144/84  09/02/22 116/78    Diabetes: Unable to obtain microalbumin/creatinine ratio on previous visit. Will obtain today. He reports he is doing well with the increase in Trulicity 4.5mg  per injection from previous appointment. Still staying Metformin.  Lab Results  Component Value Date   HGBA1C 6.5 01/09/2023    ROS See HPI above    Objective:    BP 132/82   Pulse 95   Temp 98.7 F (37.1 C)   Ht 6\' 2"  (1.88 m)   Wt (!) 343 lb 8 oz (155.8 kg)   SpO2 95%   BMI 44.10 kg/m   Physical Exam Vitals reviewed.  Constitutional:      General: He is not in acute distress.    Appearance: Normal appearance. He is obese. He is not ill-appearing, toxic-appearing or diaphoretic.  Eyes:     General:        Right eye: No discharge.        Left eye: No discharge.     Conjunctiva/sclera: Conjunctivae normal.  Cardiovascular:     Rate and Rhythm: Normal rate and regular rhythm.     Heart sounds: Normal heart sounds. No murmur heard.    No friction rub. No gallop.  Pulmonary:     Effort: Pulmonary effort is normal. No respiratory distress.     Breath sounds: Normal breath sounds.  Musculoskeletal:        General: Normal range of motion.  Skin:    General: Skin is warm and dry.   Neurological:     General: No focal deficit present.     Mental Status: He is alert and oriented to person, place, and time. Mental status is at baseline.  Psychiatric:        Mood and Affect: Mood normal.        Behavior: Behavior normal.        Thought Content: Thought content normal.        Judgment: Judgment normal.     Assessment & Plan:  Hypertension associated with diabetes ([place]) Assessment & Plan: Blood pressure is stable today, but home readings are mostly elevated, uncontrolled. Discontinue Lisinopril/HCTZ 10mg -12.5mg  and increase to Lisinopril/HCTZ 20/12.5mg . Recommend to continue monitor blood pressure and bring readings to next appointment in May. Advised to send a MyChart message or follow up sooner if his blood pressures become too low, 100/59.   Orders: -     Lisinopril-hydroCHLOROthiazide; Take 1 tablet by mouth daily.  Dispense: 90 tablet; Refill: 0  Diabetes mellitus without complication [facility]) Assessment & Plan: Ordered Microalbumin/Creatinine Urine Ratio. Refilled Trulicity 4.5mg  injection for a 3 month supply. Will send his information for to schedule for a diabetic exam for May 15th in office at [facility].  Orders: -     Microalbumin / creatinine urine ratio -     Trulicity; Inject 4.5 mg as directed once a week.  Dispense: 6 mL; Refill: 0   Return in about 7 weeks (around 03/15/2023) for follow-up: blood pressure.   Valarie Merino, NP

## 2023-01-24 NOTE — Assessment & Plan Note (Addendum)
Blood pressure is stable today, but home readings are mostly elevated, uncontrolled. Discontinue Lisinopril/HCTZ 10mg -12.5mg  and increase to Lisinopril/HCTZ 20/12.5mg . Recommend to continue monitor blood pressure and bring readings to next appointment in May. Advised to send a MyChart message or follow up sooner if his blood pressures become too low, 100/59.

## 2023-01-24 NOTE — Patient Instructions (Addendum)
-  Increased Lisinopril/Hydrochlorothiazide to 20-12.5mg , take 1 tablet in the morning.  -Ordered microalbumin/Creat ratio.  -Will schedule Diabetic Eye Exam for May 15th.  -Follow up May 15th for follow up for blood pressure.

## 2023-01-30 NOTE — Progress Notes (Signed)
Called pt vm has not been set up. Will call back later today.

## 2023-01-30 NOTE — Progress Notes (Signed)
Called pt and vm not set up at this time.

## 2023-02-13 ENCOUNTER — Ambulatory Visit: Payer: Managed Care, Other (non HMO) | Admitting: Family Medicine

## 2023-02-13 DIAGNOSIS — E119 Type 2 diabetes mellitus without complications: Secondary | ICD-10-CM

## 2023-02-13 DIAGNOSIS — I152 Hypertension secondary to endocrine disorders: Secondary | ICD-10-CM

## 2023-02-14 ENCOUNTER — Other Ambulatory Visit (HOSPITAL_BASED_OUTPATIENT_CLINIC_OR_DEPARTMENT_OTHER): Payer: Self-pay

## 2023-03-09 ENCOUNTER — Other Ambulatory Visit (HOSPITAL_COMMUNITY): Payer: Self-pay

## 2023-03-09 ENCOUNTER — Other Ambulatory Visit (HOSPITAL_BASED_OUTPATIENT_CLINIC_OR_DEPARTMENT_OTHER): Payer: Self-pay

## 2023-03-09 ENCOUNTER — Other Ambulatory Visit: Payer: Self-pay

## 2023-03-09 ENCOUNTER — Other Ambulatory Visit: Payer: Self-pay | Admitting: Registered Nurse

## 2023-03-09 DIAGNOSIS — E119 Type 2 diabetes mellitus without complications: Secondary | ICD-10-CM

## 2023-03-10 ENCOUNTER — Other Ambulatory Visit (HOSPITAL_BASED_OUTPATIENT_CLINIC_OR_DEPARTMENT_OTHER): Payer: Self-pay

## 2023-03-15 LAB — HM DIABETES EYE EXAM

## 2023-04-03 ENCOUNTER — Other Ambulatory Visit (HOSPITAL_COMMUNITY): Payer: Self-pay

## 2023-04-05 ENCOUNTER — Other Ambulatory Visit (HOSPITAL_BASED_OUTPATIENT_CLINIC_OR_DEPARTMENT_OTHER): Payer: Self-pay

## 2023-04-08 ENCOUNTER — Other Ambulatory Visit (HOSPITAL_BASED_OUTPATIENT_CLINIC_OR_DEPARTMENT_OTHER): Payer: Self-pay

## 2023-04-10 ENCOUNTER — Other Ambulatory Visit (HOSPITAL_BASED_OUTPATIENT_CLINIC_OR_DEPARTMENT_OTHER): Payer: Self-pay

## 2023-04-10 ENCOUNTER — Other Ambulatory Visit: Payer: Self-pay | Admitting: Registered Nurse

## 2023-04-10 DIAGNOSIS — E119 Type 2 diabetes mellitus without complications: Secondary | ICD-10-CM

## 2023-04-10 NOTE — Telephone Encounter (Signed)
Caller name: ARMSTEAD HEILAND  On DPR?: Yes  Call back number: [phone]  Provider they see: Alveria Apley, NP  Reason for call:   Patient's spouse is calling about Trulicity shortage and would like to know if it can be replaced with Ozempic or Wegovy. - Advise

## 2023-04-11 ENCOUNTER — Telehealth: Payer: Self-pay

## 2023-04-11 ENCOUNTER — Other Ambulatory Visit: Payer: Self-pay

## 2023-04-11 ENCOUNTER — Other Ambulatory Visit (HOSPITAL_BASED_OUTPATIENT_CLINIC_OR_DEPARTMENT_OTHER): Payer: Self-pay

## 2023-04-11 DIAGNOSIS — E119 Type 2 diabetes mellitus without complications: Secondary | ICD-10-CM

## 2023-04-11 NOTE — Progress Notes (Signed)
Patient needs refill on trulicity but this is always on back order wife asked if this could be switched to ozempic

## 2023-04-11 NOTE — Telephone Encounter (Signed)
Patient needs refill on trulicity but this is always on back order wife asked if this could be switched to ozempic

## 2023-04-11 NOTE — Telephone Encounter (Signed)
Left vm for pt to call office 

## 2023-04-12 ENCOUNTER — Other Ambulatory Visit (HOSPITAL_BASED_OUTPATIENT_CLINIC_OR_DEPARTMENT_OTHER): Payer: Self-pay

## 2023-04-12 NOTE — Telephone Encounter (Signed)
Patient scheduled.

## 2023-04-13 ENCOUNTER — Other Ambulatory Visit (HOSPITAL_COMMUNITY): Payer: Self-pay

## 2023-04-13 ENCOUNTER — Other Ambulatory Visit (HOSPITAL_BASED_OUTPATIENT_CLINIC_OR_DEPARTMENT_OTHER): Payer: Self-pay

## 2023-04-14 ENCOUNTER — Encounter: Payer: Self-pay | Admitting: Family Medicine

## 2023-04-14 ENCOUNTER — Ambulatory Visit (INDEPENDENT_AMBULATORY_CARE_PROVIDER_SITE_OTHER): Payer: Managed Care, Other (non HMO) | Admitting: Family Medicine

## 2023-04-14 VITALS — BP 128/80 | HR 78 | Temp 98.1°F | Ht 74.0 in | Wt 337.2 lb

## 2023-04-14 DIAGNOSIS — E1159 Type 2 diabetes mellitus with other circulatory complications: Secondary | ICD-10-CM | POA: Diagnosis not present

## 2023-04-14 DIAGNOSIS — E785 Hyperlipidemia, unspecified: Secondary | ICD-10-CM | POA: Diagnosis not present

## 2023-04-14 DIAGNOSIS — E1169 Type 2 diabetes mellitus with other specified complication: Secondary | ICD-10-CM | POA: Diagnosis not present

## 2023-04-14 DIAGNOSIS — I152 Hypertension secondary to endocrine disorders: Secondary | ICD-10-CM

## 2023-04-14 DIAGNOSIS — E119 Type 2 diabetes mellitus without complications: Secondary | ICD-10-CM

## 2023-04-14 DIAGNOSIS — Z7985 Long-term (current) use of injectable non-insulin antidiabetic drugs: Secondary | ICD-10-CM

## 2023-04-14 MED ORDER — TIRZEPATIDE 2.5 MG/0.5ML ~~LOC~~ SOAJ
2.5000 mg | SUBCUTANEOUS | 0 refills | Status: DC
Start: 2023-04-14 — End: 2023-05-10

## 2023-04-14 NOTE — Patient Instructions (Signed)
-  Finish taking Trulicity injections that you have already on hand.  -Prescribed Mounjaro 2.5mg  per weekly injections. Please call back to office if you are unable to obtain medication.  -Ordered A1c, lipids, and CMP. Office will call with lab results and you may see them on MyChart. -Ordered microalbumin creatinine urine.  -Follow back up in 1 month.

## 2023-04-14 NOTE — Assessment & Plan Note (Signed)
Recommend patient to finish taking Trulicity 4.5mg  per injection that he already has at hand. Through shared decision making, will try Mounjaro 2.5mg  per injection to see insurance will cover. Informed patient if he is unable to obtain medication, please call back to the office. Continue with Metformin. 500mg  BID. Obtained urine for microalbumin/creatinine urine ratio. Ordered A1c and CMP.

## 2023-04-14 NOTE — Assessment & Plan Note (Signed)
Patient is not on a statin. Based on review of medications, he has been on Rosuvastatin and Atorvastatin. He reports statin cause muscle cramps.

## 2023-04-14 NOTE — Progress Notes (Signed)
Established Patient Office Visit   Subjective:  Patient ID: John Mullins, male    DOB: 1971/08/25  Age: 52 y.o. MRN: [id number]  Chief Complaint  Patient presents with   Medication Problem    Pt has called insurance and they states they cover Bydureon, Metformin, Urgent. Pt reports Trulicity has been hard to find.      HPI Diabetes: Chronic. Patient is taking Trulicity 4.5mg  per injection and Metformin 500mg  BID. He reports it has been very difficult to obtain Trulicity injections. He reports he has not missed a dose. He reports his spouse has tried [employer] and reports he is really uncertain of what is covered, but knows Bydureon and Metformin is covered. Last A1c was 6.5 on 03/11. He reports he has been doing good with Trulicity, no side effects with medication. He denies monitoring his blood sugar. He reports his next injection is due next week and has two more weeks of medication. Not on a statin, he reports when he was taking statin, it caused muscle aches.   HTN: Chronic. Patient is taking Lisinopril/HCTZ 20-12.5mg  daily. On previous appointment, Lisinopril was increased to 20mg  from 10mg . He reports his blood pressure has been about the same as the reading today. Denies CP, SHOB, HA, dizziness, lightheadedness, and lower extremity edema.   ROS See HPI above     Objective:   BP 128/80   Pulse 78   Temp 98.1 F (36.7 C)   Ht 6\' 2"  (1.88 m)   Wt (!) 337 lb 4 oz (153 kg)   SpO2 95%   BMI 43.30 kg/m    Physical Exam Vitals reviewed.  Constitutional:      General: He is not in acute distress.    Appearance: Normal appearance. He is obese. He is not ill-appearing, toxic-appearing or diaphoretic.  Eyes:     General:        Right eye: No discharge.        Left eye: No discharge.     Conjunctiva/sclera: Conjunctivae normal.  Cardiovascular:     Rate and Rhythm: Normal rate and regular rhythm.     Heart sounds: Normal heart sounds. No murmur heard.     No friction rub. No gallop.  Pulmonary:     Effort: Pulmonary effort is normal. No respiratory distress.     Breath sounds: Normal breath sounds.  Musculoskeletal:        General: Normal range of motion.     Right lower leg: No edema.     Left lower leg: No edema.  Skin:    General: Skin is warm and dry.  Neurological:     General: No focal deficit present.     Mental Status: He is alert and oriented to person, place, and time. Mental status is at baseline.  Psychiatric:        Mood and Affect: Mood normal.        Behavior: Behavior normal.        Thought Content: Thought content normal.        Judgment: Judgment normal.      Assessment & Plan:  Diabetes mellitus without complication (HCC) Assessment & Plan: Recommend patient to finish taking Trulicity 4.5mg  per injection that he already has at hand. Through shared decision making, will try Mounjaro 2.5mg  per injection to see insurance will cover. Informed patient if he is unable to obtain medication, please call back to the office. Continue with Metformin. 500mg  BID. Obtained urine for microalbumin/creatinine  urine ratio. Ordered A1c and CMP.   Orders: -     Hemoglobin A1c -     Microalbumin / creatinine urine ratio -     Tirzepatide; Inject 2.5 mg into the skin once a week for 28 days.  Dispense: 2 mL; Refill: 0 -     Comprehensive metabolic panel  Hyperlipidemia associated with type 2 diabetes mellitus (HCC) Assessment & Plan: Patient is not on a statin. Based on review of medications, he has been on Rosuvastatin and Atorvastatin. He reports statin cause muscle cramps.   Orders: -     Lipid panel  Hypertension associated with diabetes (HCC) Assessment & Plan: Controlled. Continue with Lisinopril/HCTZ 20-12.5mg  daily. Ordered CMP to assess kidney and potassium level.   Orders: -     Comprehensive metabolic panel  -Follow up for diabetes with starting new medication.   Return in about 1 month (around 05/14/2023) for  follow-up.   Zandra Abts, NP

## 2023-04-14 NOTE — Assessment & Plan Note (Signed)
Controlled. Continue with Lisinopril/HCTZ 20-12.5mg  daily. Ordered CMP to assess kidney and potassium level.

## 2023-04-15 LAB — COMPREHENSIVE METABOLIC PANEL
AG Ratio: 1.7 (calc) (ref 1.0–2.5)
ALT: 38 U/L (ref 9–46)
AST: 21 U/L (ref 10–35)
Albumin: 4.7 g/dL (ref 3.6–5.1)
Alkaline phosphatase (APISO): 101 U/L (ref 35–144)
BUN: 15 mg/dL (ref 7–25)
CO2: 25 mmol/L (ref 20–32)
Calcium: 10 mg/dL (ref 8.6–10.3)
Chloride: 104 mmol/L (ref 98–110)
Creat: 0.93 mg/dL (ref 0.70–1.30)
Globulin: 2.8 g/dL (calc) (ref 1.9–3.7)
Glucose, Bld: 95 mg/dL (ref 65–99)
Potassium: 4.2 mmol/L (ref 3.5–5.3)
Sodium: 140 mmol/L (ref 135–146)
Total Bilirubin: 0.5 mg/dL (ref 0.2–1.2)
Total Protein: 7.5 g/dL (ref 6.1–8.1)

## 2023-04-15 LAB — MICROALBUMIN / CREATININE URINE RATIO
Creatinine, Urine: 187 mg/dL (ref 20–320)
Microalb Creat Ratio: 7 mg/g creat (ref <30)
Microalb, Ur: 1.3 mg/dL

## 2023-04-15 LAB — LIPID PANEL
Cholesterol: 197 mg/dL (ref <200)
HDL: 41 mg/dL (ref >=40)
LDL Cholesterol (Calc): 123 mg/dL (calc) — ABNORMAL HIGH
Non-HDL Cholesterol (Calc): 156 mg/dL (calc) — ABNORMAL HIGH (ref <130)
Total CHOL/HDL Ratio: 4.8 (calc) (ref <5.0)
Triglycerides: 213 mg/dL — ABNORMAL HIGH (ref <150)

## 2023-04-15 LAB — HEMOGLOBIN A1C
Hgb A1c MFr Bld: 6.3 % of total Hgb — ABNORMAL HIGH (ref <5.7)
Mean Plasma Glucose: 134 mg/dL
eAG (mmol/L): 7.4 mmol/L

## 2023-04-18 ENCOUNTER — Other Ambulatory Visit: Payer: Self-pay

## 2023-04-18 ENCOUNTER — Telehealth: Payer: Self-pay

## 2023-04-18 DIAGNOSIS — E1169 Type 2 diabetes mellitus with other specified complication: Secondary | ICD-10-CM

## 2023-04-18 NOTE — Telephone Encounter (Signed)
-----   Message from John Apley, NP sent at 04/16/2023 11:46 AM EDT ----- Your A1c has come down to prediabetic range with the increase in Trulicity. We are going to need to recheck in 3 months with transitioning to another diabetic management. Continue to decrease carbohydrates and sweets.  Bad cholesterol (LDL) and triglycerides are elevated, recommend a referral to cardiology since you are unable to tolerate statins and diet changes. Diet encouraged - increase intake of fresh fruits and vegetables, increase intake of lean proteins. Bake, broil, or grill foods. Avoid fried, greasy, and fatty foods. Avoid fast foods. Increase intake of fiber-rich whole grains. Exercise encouraged - at least 150 minutes per week and advance as tolerated. Your 10-year ASCVD risk score is: 11%, which is an intermediate risk of having a cardiovascular event, such as a stroke or heart attack in the next 10 years. This is concerning with having diabetes.  All other labs are stable.

## 2023-04-28 ENCOUNTER — Other Ambulatory Visit: Payer: Self-pay | Admitting: Family Medicine

## 2023-04-28 DIAGNOSIS — E1159 Type 2 diabetes mellitus with other circulatory complications: Secondary | ICD-10-CM

## 2023-04-28 DIAGNOSIS — I152 Hypertension secondary to endocrine disorders: Secondary | ICD-10-CM

## 2023-05-09 ENCOUNTER — Other Ambulatory Visit: Payer: Self-pay | Admitting: Family Medicine

## 2023-05-09 DIAGNOSIS — E119 Type 2 diabetes mellitus without complications: Secondary | ICD-10-CM

## 2023-05-10 ENCOUNTER — Other Ambulatory Visit: Payer: Self-pay

## 2023-05-10 DIAGNOSIS — E119 Type 2 diabetes mellitus without complications: Secondary | ICD-10-CM

## 2023-05-10 MED ORDER — TIRZEPATIDE 2.5 MG/0.5ML ~~LOC~~ SOAJ
2.5000 mg | SUBCUTANEOUS | 1 refills | Status: DC
Start: 2023-05-10 — End: 2023-05-22

## 2023-05-15 ENCOUNTER — Ambulatory Visit: Payer: Managed Care, Other (non HMO) | Admitting: Family Medicine

## 2023-05-15 DIAGNOSIS — E119 Type 2 diabetes mellitus without complications: Secondary | ICD-10-CM

## 2023-05-15 DIAGNOSIS — Z09 Encounter for follow-up examination after completed treatment for conditions other than malignant neoplasm: Secondary | ICD-10-CM

## 2023-05-16 ENCOUNTER — Telehealth: Payer: Self-pay

## 2023-05-16 ENCOUNTER — Other Ambulatory Visit (HOSPITAL_COMMUNITY): Payer: Self-pay

## 2023-05-16 ENCOUNTER — Telehealth: Payer: Self-pay | Admitting: Family Medicine

## 2023-05-16 NOTE — Telephone Encounter (Signed)
PA request has been Submitted. New Encounter created for follow up. For additional info see Pharmacy Prior Auth telephone encounter from 05/16/23.

## 2023-05-16 NOTE — Telephone Encounter (Signed)
Received PA from cover my meds for Mounjaro 2.5mg /0.43ml. Sent to PA team. Forwarded message to clinical team to track.

## 2023-05-16 NOTE — Telephone Encounter (Signed)
Caller name: KIPLING GRASER  On DPR?: Yes  Call back number: [phone] (mobile)  Provider they see: Alveria Apley, NP  Reason for call: Pt called about refilling meds  Med: Tirzepatide; Inject 2.5 mg into the skin once a week for 28 days. Dispense: 2 mL; Refill: 0

## 2023-05-16 NOTE — Telephone Encounter (Signed)
 Noted. Thanks.

## 2023-05-16 NOTE — Telephone Encounter (Signed)
PA for an extra pen due to defective pen being wasted he will be short, pt is contacting [facility] for his portion but Needs PA submitted per the pharmacist so that the "extra" pen will be covered

## 2023-05-16 NOTE — Telephone Encounter (Signed)
Sent to [employer] Please advise

## 2023-05-16 NOTE — Telephone Encounter (Signed)
Patient has already filled the 2.5mg  dose. Can patient titrate up to the next dose or do he need to stay on 2.5mg ? 2.5mg  will require another PA. 5mg  does not. Ran test claim, received paid claim for the 5mg . Please advise if patient can start 5mg  or continue PA for 2.5mg .

## 2023-05-16 NOTE — Telephone Encounter (Signed)
[place]eld to discuss as there was already a refill for 2mL sent on 05/10/2023 with an additional refill attached

## 2023-05-16 NOTE — Telephone Encounter (Signed)
Pharmacy Patient Advocate Encounter   Received notification from Pt Calls Messages that prior authorization for Mounjaro 2.5MG /0.5ML pen-injectors is required/requested.   Insurance verification completed.   The patient is insured through [employer] .   Per test claim: PA submitted to [employer] via CoverMyMeds Key/confirmation #/EOC [id number] Status is pending

## 2023-05-16 NOTE — Telephone Encounter (Signed)
Will archive PA request for now. If PA is still required after patient's appointment, will renew PA and proceed.

## 2023-05-18 ENCOUNTER — Other Ambulatory Visit (HOSPITAL_COMMUNITY): Payer: Self-pay

## 2023-05-18 NOTE — Telephone Encounter (Signed)
Pharmacy Patient Advocate Encounter  Received notification from [employer] that Prior Authorization for  [facility] 2.5MG /0.5ML  has been APPROVED from 05/16/23 to 05/15/24.Marland Kitchen  PA #/Case ID/Reference #: [id number]   Approval letter attached to charts

## 2023-05-18 NOTE — Telephone Encounter (Signed)
Pt has been called and LM on VM per pt instructions letting pt know this will increase next time

## 2023-05-18 NOTE — Telephone Encounter (Signed)
Pt informed he wanted to ask if you would be increasing this dose next refill or if it will be later? Notes no side effects he is feeling well

## 2023-05-22 ENCOUNTER — Ambulatory Visit (INDEPENDENT_AMBULATORY_CARE_PROVIDER_SITE_OTHER): Payer: Managed Care, Other (non HMO) | Admitting: Family Medicine

## 2023-05-22 ENCOUNTER — Encounter: Payer: Self-pay | Admitting: Family Medicine

## 2023-05-22 VITALS — BP 112/62 | HR 103 | Temp 97.9°F | Ht 74.0 in | Wt 335.0 lb

## 2023-05-22 DIAGNOSIS — E119 Type 2 diabetes mellitus without complications: Secondary | ICD-10-CM | POA: Diagnosis not present

## 2023-05-22 DIAGNOSIS — Z7985 Long-term (current) use of injectable non-insulin antidiabetic drugs: Secondary | ICD-10-CM | POA: Diagnosis not present

## 2023-05-22 MED ORDER — TIRZEPATIDE 5 MG/0.5ML ~~LOC~~ SOPN
5.0000 mg | PEN_INJECTOR | SUBCUTANEOUS | 1 refills | Status: AC
Start: 2023-05-22 — End: 2023-07-11

## 2023-05-22 NOTE — Assessment & Plan Note (Signed)
Patient is doing well with transitioning to [facility] from [employer]. Discontinued Mounjaro 2.5mg  per injection and increase to 5mg  per injection for 8 weeks. He has a physical scheduled on 09/10, but going to extend a week out to when his next 3 month A1c lab is due. Advised patient if he had trouble with obtaining the medication, please call back to the office.

## 2023-05-22 NOTE — Patient Instructions (Signed)
-  Discontinue taking Mounjaro 2.5mg  per weekly injection. -START Mounjaro 5mg  per weekly injection. If you have problems with obtaining medication, please call the office.  -Extend your physical appointment out another week, be fasting.

## 2023-05-22 NOTE — Progress Notes (Signed)
   Established Patient Office Visit   Subjective:  Patient ID: John Mullins, male    DOB: 12/17/1970  Age: 52 y.o. MRN: [id number]  Chief Complaint  Patient presents with   Follow-up    Pt is here today for F/U Pt is tirzepatide [facility]) 2.5 MG/0.5ML. Pt reports he had no side effects. Pt reports he has been taking Trulicity for 2 weeks, due to being out of MOUNJARO    HPI Diabetes: Chronic. On previous visit, started [facility] 2.5mg  per injection. He denies any side effects with the medication. Denies any GI symptoms. He denies that he has been monitoring his blood sugar. He reports he has took Trulicity 4.5mg /per injection for the last 2 weeks. On previous visit, he had some Trulicity injections left, but went straight into taking Mounjaro. He reports one of his Mounjaro pens malfunctioned, the needle didn't come out. He states his spouse has called the company and is suppose to get a free coupon for a month supply.    ROS See HPI above     Objective:   BP 112/62   Pulse (!) 103   Temp 97.9 F (36.6 C)   Ht 6\' 2"  (1.88 m)   Wt (!) 335 lb (152 kg)   BMI 43.01 kg/m    Physical Exam Vitals reviewed.  Constitutional:      General: He is not in acute distress.    Appearance: Normal appearance. He is obese. He is not ill-appearing, toxic-appearing or diaphoretic.  Eyes:     General:        Right eye: No discharge.        Left eye: No discharge.     Conjunctiva/sclera: Conjunctivae normal.  Cardiovascular:     Rate and Rhythm: Normal rate and regular rhythm.     Heart sounds: Normal heart sounds. No murmur heard.    No friction rub. No gallop.  Pulmonary:     Effort: Pulmonary effort is normal. No respiratory distress.     Breath sounds: Normal breath sounds.  Musculoskeletal:        General: Normal range of motion.  Skin:    General: Skin is warm and dry.  Neurological:     General: No focal deficit present.     Mental Status: He is alert and oriented to person,  place, and time. Mental status is at baseline.  Psychiatric:        Mood and Affect: Mood normal.        Behavior: Behavior normal.        Thought Content: Thought content normal.        Judgment: Judgment normal.      Assessment & Plan:  Diabetes mellitus without complication [facility]) Assessment & Plan: Patient is doing well with transitioning to [facility] from Trulicity. Discontinued Mounjaro 2.5mg  per injection and increase to 5mg  per injection for 8 weeks. He has a physical scheduled on 09/10, but going to extend a week out to when his next 3 month A1c lab is due. Advised patient if he had trouble with obtaining the medication, please call back to the office.   Orders: -     Tirzepatide; Inject 5 mg into the skin once a week for 8 doses.  Dispense: 2 mL; Refill: 1   Return in about 2 months (around 07/23/2023) for physical:patient has a physical scheduled for 09/10, but would like to extend one more week.   Zandra Abts, NP

## 2023-05-30 ENCOUNTER — Encounter: Payer: Self-pay | Admitting: Cardiology

## 2023-05-30 ENCOUNTER — Ambulatory Visit: Payer: Managed Care, Other (non HMO) | Attending: Cardiology | Admitting: Cardiology

## 2023-05-30 VITALS — BP 110/76 | HR 93 | Ht 74.0 in | Wt 339.4 lb

## 2023-05-30 DIAGNOSIS — E1159 Type 2 diabetes mellitus with other circulatory complications: Secondary | ICD-10-CM | POA: Diagnosis not present

## 2023-05-30 DIAGNOSIS — R0683 Snoring: Secondary | ICD-10-CM | POA: Diagnosis not present

## 2023-05-30 DIAGNOSIS — E785 Hyperlipidemia, unspecified: Secondary | ICD-10-CM

## 2023-05-30 DIAGNOSIS — R0609 Other forms of dyspnea: Secondary | ICD-10-CM

## 2023-05-30 DIAGNOSIS — R9431 Abnormal electrocardiogram [ECG] [EKG]: Secondary | ICD-10-CM

## 2023-05-30 DIAGNOSIS — I152 Hypertension secondary to endocrine disorders: Secondary | ICD-10-CM | POA: Diagnosis not present

## 2023-05-30 DIAGNOSIS — Z7984 Long term (current) use of oral hypoglycemic drugs: Secondary | ICD-10-CM

## 2023-05-30 HISTORY — DX: Hyperlipidemia, unspecified: E78.5

## 2023-05-30 HISTORY — DX: Abnormal electrocardiogram (ECG) (EKG): R94.31

## 2023-05-30 MED ORDER — ATORVASTATIN CALCIUM 10 MG PO TABS: 10.0000 mg | ORAL_TABLET | Freq: Every day | ORAL | 3 refills | Status: DC

## 2023-05-30 MED ORDER — ASPIRIN 81 MG PO TBEC: 81.0000 mg | DELAYED_RELEASE_TABLET | Freq: Every day | ORAL | Status: AC

## 2023-05-30 NOTE — Addendum Note (Signed)
Addended by: Baldo Ash D on: 05/30/2023 02:30 PM   Modules accepted: Orders

## 2023-05-30 NOTE — Progress Notes (Signed)
Cardiology Consultation:    Date:  05/30/2023   ID:  John Mullins, DOB 1971-08-02, MRN [id number]  PCP:  Alveria Apley, NP  Cardiologist:  Gypsy Balsam, MD   Referring MD: Alveria Apley, NP   Chief Complaint  Patient presents with   Heart Eval    H/o DM Type 2 and Hyperlipidemia    History of Present Illness:    John Mullins is a 52 y.o. male who is being seen today for the evaluation of risk factors for coronary artery disease at the request of Alveria Apley, NP.  Past medical history significant for diabetes mellitus type 2 that was diagnosed initially 2 years ago but is a very good possibility he had a long time before nobody was able to recognize this, dyslipidemia, anxiety, obstructive sleep apnea with no CPAP mask.  Morbid obesity. He was referred to [place] to modify his risk factors.  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  Within the last few months he lost significant amount of weight more than 25 pounds.  He is feeling much better.  Before that he is snoring but now he does not.  He does not have any chest pain tightness squeezing pressure burning chest no palpitations dizziness swelling of lower extremities no cardiac complaints.  His family history is significant for coronary artery disease but not premature, he is not on any special diet he is trying to be active and he is active at work but does not do any structural exercises.  Past Medical History:  Diagnosis Date   Anxiety    Diabetes mellitus without complication (HCC)    Diverticulitis    GERD (gastroesophageal reflux disease)    Hx of adenomatous polyp of colon 09/13/2022   Single diminutive adenoma recall 2030   Hypertension    Sleep apnea    No CPap    Past Surgical History:  Procedure Laterality Date   ARTHOSCOPIC ROTAOR CUFF REPAIR Right  10/03/2019   Procedure: ARTHROSCOPIC ROTATOR CUFF REPAIR;  Surgeon: Jones Broom, MD;  Location: [facility];  Service: Orthopedics;  Laterality: Right;   SHOULDER ARTHROSCOPY WITH SUBACROMIAL DECOMPRESSION Right 10/03/2019   Procedure: SHOULDER ARTHROSCOPY WITH SUBACROMIAL DECOMPRESSION;  Surgeon: Jones Broom, MD;  Location: [facility];  Service: Orthopedics;  Laterality: Right;    Current Medications: Current Meds  Medication Sig   lisinopril-hydrochlorothiazide (ZESTORETIC) 20-12.5 MG tablet TAKE 1 TABLET BY MOUTH EVERY DAY   metFORMIN (GLUCOPHAGE) 500 MG tablet Take 1 tablet (500 mg total) by mouth 2 (two) times daily with a meal.   omeprazole (PRILOSEC) 40 MG capsule Take 1 capsule (40 mg total) by mouth daily.   ondansetron (ZOFRAN-ODT) 4 MG disintegrating tablet Take 4 mg by mouth every 8 (eight) hours as needed for nausea or vomiting.   sertraline (ZOLOFT) 100 MG tablet Take 1 tablet (100 mg total) by mouth daily.   tirzepatide [facility]) 5 MG/0.5ML Pen Inject 5 mg into the skin once a week for 8 doses.     Allergies:   Patient has no known allergies.   Social History   Socioeconomic History   Marital status: Married    Spouse name: Not on file   Number of children: 1   Years of education: Not on file   Highest education level: Associate degree: occupational, Scientist, product/process development, or vocational program  Occupational History   Occupation: Chief Technology Officer   Occupation: Chartered loss adjuster  Tobacco Use   Smoking status: Never   Smokeless tobacco: Never  Vaping Use   Vaping status: Never Used  Substance and Sexual Activity   Alcohol use: No   Drug use: No   Sexual activity: Yes  Other Topics Concern   Not on file  Social History Narrative   Married-lives with spouse   Employed as an Personnel officer   Never smoker no alcohol or drugs   2 dogs   Social Determinants of Health   Financial Resource Strain: Low Risk  (05/21/2023)   Overall Financial Resource Strain (CARDIA)    Difficulty of Paying  Living Expenses: Not hard at all  Food Insecurity: No Food Insecurity (05/21/2023)   Hunger Vital Sign    Worried About Running Out of Food in the Last Year: Never true    Ran Out of Food in the Last Year: Never true  Transportation Needs: No Transportation Needs (05/21/2023)   PRAPARE - Administrator, Civil Service (Medical): No    Lack of Transportation (Non-Medical): No  Physical Activity: Sufficiently Active (05/21/2023)   Exercise Vital Sign    Days of Exercise per Week: 7 days    Minutes of Exercise per Session: 150+ min  Stress: No Stress Concern Present (05/21/2023)   [employer] of Occupational Health - Occupational Stress Questionnaire    Feeling of Stress : Only a little  Social Connections: Moderately Integrated (05/21/2023)   Social Connection and Isolation Panel [NHANES]    Frequency of Communication with Friends and Family: More than three times a week    Frequency of Social Gatherings with Friends and Family: More than three times a week    Attends Religious Services: More than 4 times per year    Active Member of [employer] or Organizations: No    Attends Engineer, structural: Not on file    Marital Status: Married     Family History: The patient's family history includes Diabetes in his father and mother; Diverticulitis in his brother and mother; Glaucoma in his mother; Heart disease in his father; Stroke in his father. There is no history of Colon cancer, Esophageal cancer, Rectal cancer, or Stomach cancer. ROS:   Please see the history of present illness.    All 14 point review of systems negative except as described per history of present illness.  EKGs/Labs/Other Studies Reviewed:    The following studies were reviewed today:   EKG:  EKG Interpretation Date/Time:  Tuesday May 30 2023 13:40:51 EDT Ventricular Rate:  93 PR Interval:  146 QRS Duration:  96 QT Interval:  360 QTC Calculation: 447 R Axis:   -50  Text  Interpretation: Normal sinus rhythm Left anterior fascicular block Possible Anterolateral infarct , age undetermined Abnormal ECG When compared with ECG of 10-Jun-2022 16:23, Borderline criteria for Anterolateral infarct are now Present Confirmed by Gypsy Balsam [id number]) on 05/30/2023 1:45:26 PM    Recent Labs: 06/10/2022: Magnesium 1.8 08/25/2022: TSH 2.12 01/09/2023: Hemoglobin 13.8; Platelets 367.0 04/14/2023: ALT 38; BUN 15; Creat 0.93; Potassium 4.2; Sodium 140  Recent Lipid Panel    Component Value Date/Time   CHOL 197 04/14/2023 1544   CHOL 173 01/26/2018 1745   TRIG 213 (H) 04/14/2023 1544   TRIG 290 (HH) 09/25/2006 1457   HDL 41 04/14/2023 1544   HDL 28 (L) 01/26/2018 1745   CHOLHDL 4.8 04/14/2023 1544   VLDL 31.2 08/25/2022 1431   LDLCALC 123 (  H) 04/14/2023 1544   LDLDIRECT 180.0 09/06/2021 1354    Physical Exam:    VS:  BP 110/76 (BP Location: Left Arm, Patient Position: Sitting)   Pulse 93   Ht 6\' 2"  (1.88 m)   Wt (!) 339 lb 6.4 oz (154 kg)   SpO2 94%   BMI 43.58 kg/m     Wt Readings from Last 3 Encounters:  05/30/23 (!) 339 lb 6.4 oz (154 kg)  05/22/23 (!) 335 lb (152 kg)  04/14/23 (!) 337 lb 4 oz (153 kg)     GEN:  Well nourished, well developed in no acute distress HEENT: Normal NECK: No JVD; No carotid bruits LYMPHATICS: No lymphadenopathy CARDIAC: RRR, no murmurs, no rubs, no gallops RESPIRATORY:  Clear to auscultation without rales, wheezing or rhonchi  ABDOMEN: Soft, non-tender, non-distended MUSCULOSKELETAL:  No edema; No deformity  SKIN: Warm and dry NEUROLOGIC:  Alert and oriented x 3 PSYCHIATRIC:  Normal affect   ASSESSMENT:    1. Hypertension associated with diabetes (HCC)   2. Dyslipidemia   3. Snoring   4. Nonspecific abnormal electrocardiogram (ECG) (EKG)    PLAN:    In order of problems listed above:  Essential hypertension associated with diabetes.  His diabetes is fairly controlled I do have hemoglobin A1c from month ago which  was 6.3.  I encouraged him to be more active which should help with improvement of diabetes control.  Blood pressure seems to well-controlled today we will continue present management. Diabetes discussion as above.  I did explain to him that he developed type 2 diabetes and what the mechanism of this is I explained to him that exercises on the regular basis even if the weight does not go down will be very good from metabolic point review 80 will make his fat content decrease in muscle contact increase which will increase sensitivity to insulin.  He agree with me and he promised to be active.  Because of multiple risk factors for coronary artery disease I think will be reasonable to start baby aspirin every single day. Dyslipidemia I did review K PN which show me LDL 123 HDL 41.  I will start him on Lipitor 10, fasting lipid profile, AST OT will be done within the next 6 weeks. I suspect he have a sleep apnea however he said after he lost significant amount of weight snoring went away.  In the future we may consider a sleep study. If he is EKG is abnormal showing possibility of anterior wall of myocardial infarction which I think is unlikely since he did not have any symptomatology but the only way to improve it will be to do an echocardiogram which we will schedule him to have   Medication Adjustments/Labs and Tests Ordered: Current medicines are reviewed at length with the patient today.  Concerns regarding medicines are outlined above.  Orders Placed This Encounter  Procedures   EKG 12-Lead   No orders of the defined types were placed in this encounter.   Signed, Georgeanna Lea, MD, [facility]. 05/30/2023 2:02 PM    [facility]

## 2023-05-30 NOTE — Patient Instructions (Signed)
Medication Instructions:   START: Lipitor 10mg  1 tablet daily  START: Aspirin 81mg  1 tablet daily   Lab Work: Your physician recommends that you return for lab work in: 6 weeks You need to have labs done when you are fasting.  You can come Monday through Friday 8:30 am to 12:00 pm and 1:15 to 4:30. You do not need to make an appointment as the order has already been placed. The labs you are going to have done are AST, ALT Lipids.    Testing/Procedures: Your physician has requested that you have an echocardiogram. Echocardiography is a painless test that uses sound waves to create images of your heart. It provides your doctor with information about the size and shape of your heart and how well your heart's chambers and valves are working. This procedure takes approximately one hour. There are no restrictions for this procedure. Please do NOT wear cologne, perfume, aftershave, or lotions (deodorant is allowed). Please arrive 15 minutes prior to your appointment time.'    Follow-Up: At [facility], you and your health needs are our priority.  As part of our continuing mission to provide you with exceptional heart care, we have created designated Provider Care Teams.  These Care Teams include your primary Cardiologist (physician) and Advanced Practice Providers (APPs -  Physician Assistants and Nurse Practitioners) who all work together to provide you with the care you need, when you need it.  We recommend signing up for the patient portal called "MyChart".  Sign up information is provided on this After Visit Summary.  MyChart is used to connect with patients for Virtual Visits (Telemedicine).  Patients are able to view lab/test results, encounter notes, upcoming appointments, etc.  Non-urgent messages can be sent to your provider as well.   To learn more about what you can do with MyChart, go to [email or web address].    Your next appointment:   2 month(s)  The format for your next  appointment:   In Person  Provider:   Gypsy Balsam, MD    Other Instructions NA

## 2023-06-06 ENCOUNTER — Other Ambulatory Visit: Payer: Self-pay | Admitting: Cardiology

## 2023-06-06 MED ORDER — ATORVASTATIN CALCIUM 10 MG PO TABS: 10.0000 mg | ORAL_TABLET | Freq: Every day | ORAL | 1 refills | Status: DC

## 2023-06-06 NOTE — Telephone Encounter (Signed)
*  STAT* If patient is at the pharmacy, call can be transferred to refill team.   1. Which medications need to be refilled? (please list name of each medication and dose if known) Aspirin 81mg  1 tablet daily Lipitor 10mg  1 tablet daily  2. Which pharmacy/location (including street and city if local pharmacy) is medication to be sent to?[id number] - [place], [place] - [address]   3. Do they need a 30 day or 90 day supply? 90 Day Supply

## 2023-06-15 ENCOUNTER — Encounter (INDEPENDENT_AMBULATORY_CARE_PROVIDER_SITE_OTHER): Payer: Self-pay

## 2023-06-23 ENCOUNTER — Ambulatory Visit: Payer: Managed Care, Other (non HMO) | Attending: Cardiology

## 2023-06-23 DIAGNOSIS — R0609 Other forms of dyspnea: Secondary | ICD-10-CM | POA: Diagnosis not present

## 2023-06-23 DIAGNOSIS — R9431 Abnormal electrocardiogram [ECG] [EKG]: Secondary | ICD-10-CM | POA: Diagnosis not present

## 2023-06-23 DIAGNOSIS — I5030 Unspecified diastolic (congestive) heart failure: Secondary | ICD-10-CM | POA: Diagnosis not present

## 2023-06-23 LAB — ECHOCARDIOGRAM COMPLETE
Area-P 1/2: 3.75 cm2
S' Lateral: 3.2 cm

## 2023-06-27 ENCOUNTER — Encounter: Payer: Self-pay | Admitting: Internal Medicine

## 2023-06-28 ENCOUNTER — Encounter: Payer: Self-pay | Admitting: Cardiology

## 2023-07-11 ENCOUNTER — Encounter: Payer: Managed Care, Other (non HMO) | Admitting: Family Medicine

## 2023-07-18 ENCOUNTER — Ambulatory Visit: Payer: Managed Care, Other (non HMO) | Admitting: Family Medicine

## 2023-07-18 ENCOUNTER — Telehealth: Payer: Self-pay

## 2023-07-18 ENCOUNTER — Encounter: Payer: Self-pay | Admitting: Family Medicine

## 2023-07-18 ENCOUNTER — Other Ambulatory Visit: Payer: Self-pay

## 2023-07-18 VITALS — BP 122/72 | HR 81 | Temp 98.5°F | Ht 73.0 in | Wt 326.5 lb

## 2023-07-18 DIAGNOSIS — I152 Hypertension secondary to endocrine disorders: Secondary | ICD-10-CM | POA: Diagnosis not present

## 2023-07-18 DIAGNOSIS — E1169 Type 2 diabetes mellitus with other specified complication: Secondary | ICD-10-CM

## 2023-07-18 DIAGNOSIS — E119 Type 2 diabetes mellitus without complications: Secondary | ICD-10-CM

## 2023-07-18 DIAGNOSIS — Z0000 Encounter for general adult medical examination without abnormal findings: Secondary | ICD-10-CM

## 2023-07-18 DIAGNOSIS — E1159 Type 2 diabetes mellitus with other circulatory complications: Secondary | ICD-10-CM

## 2023-07-18 DIAGNOSIS — Z23 Encounter for immunization: Secondary | ICD-10-CM

## 2023-07-18 DIAGNOSIS — Z6841 Body Mass Index (BMI) 40.0 and over, adult: Secondary | ICD-10-CM

## 2023-07-18 DIAGNOSIS — E785 Hyperlipidemia, unspecified: Secondary | ICD-10-CM | POA: Diagnosis not present

## 2023-07-18 DIAGNOSIS — Z7985 Long-term (current) use of injectable non-insulin antidiabetic drugs: Secondary | ICD-10-CM

## 2023-07-18 LAB — CBC WITH DIFFERENTIAL/PLATELET
Basophils Absolute: 0.1 10*3/uL (ref 0.0–0.1)
Basophils Relative: 0.7 % (ref 0.0–3.0)
Eosinophils Absolute: 0.2 10*3/uL (ref 0.0–0.7)
Eosinophils Relative: 1.8 % (ref 0.0–5.0)
HCT: 40.2 % (ref 39.0–52.0)
Hemoglobin: 12.9 g/dL — ABNORMAL LOW (ref 13.0–17.0)
Lymphocytes Relative: 22.2 % (ref 12.0–46.0)
Lymphs Abs: 2.9 10*3/uL (ref 0.7–4.0)
MCHC: 32.1 g/dL (ref 30.0–36.0)
MCV: 86.6 fl (ref 78.0–100.0)
Monocytes Absolute: 0.8 10*3/uL (ref 0.1–1.0)
Monocytes Relative: 5.9 % (ref 3.0–12.0)
Neutro Abs: 9 10*3/uL — ABNORMAL HIGH (ref 1.4–7.7)
Neutrophils Relative %: 69.4 % (ref 43.0–77.0)
Platelets: 368 10*3/uL (ref 150.0–400.0)
RBC: 4.65 Mil/uL (ref 4.22–5.81)
RDW: 15.1 % (ref 11.5–15.5)
WBC: 13 10*3/uL — ABNORMAL HIGH (ref 4.0–10.5)

## 2023-07-18 LAB — LIPID PANEL
Cholesterol: 194 mg/dL (ref 0–200)
HDL: 37.9 mg/dL — ABNORMAL LOW (ref >39.00)
LDL Cholesterol: 120 mg/dL — ABNORMAL HIGH (ref 0–99)
NonHDL: 156.58
Total CHOL/HDL Ratio: 5
Triglycerides: 183 mg/dL — ABNORMAL HIGH (ref 0.0–149.0)
VLDL: 36.6 mg/dL (ref 0.0–40.0)

## 2023-07-18 LAB — COMPREHENSIVE METABOLIC PANEL
ALT: 32 U/L (ref 0–53)
AST: 22 U/L (ref 0–37)
Albumin: 4.2 g/dL (ref 3.5–5.2)
Alkaline Phosphatase: 95 U/L (ref 39–117)
BUN: 18 mg/dL (ref 6–23)
CO2: 25 meq/L (ref 19–32)
Calcium: 9.2 mg/dL (ref 8.4–10.5)
Chloride: 104 meq/L (ref 96–112)
Creatinine, Ser: 0.89 mg/dL (ref 0.40–1.50)
GFR: 98.11 mL/min (ref >60.00)
Glucose, Bld: 111 mg/dL — ABNORMAL HIGH (ref 70–99)
Potassium: 3.9 meq/L (ref 3.5–5.1)
Sodium: 137 meq/L (ref 135–145)
Total Bilirubin: 0.3 mg/dL (ref 0.2–1.2)
Total Protein: 7.2 g/dL (ref 6.0–8.3)

## 2023-07-18 LAB — HEMOGLOBIN A1C: Hgb A1c MFr Bld: 6.2 % (ref 4.6–6.5)

## 2023-07-18 MED ORDER — ATORVASTATIN CALCIUM 20 MG PO TABS
20.0000 mg | ORAL_TABLET | Freq: Every day | ORAL | 1 refills | Status: DC
Start: 2023-07-18 — End: 2023-12-22

## 2023-07-18 MED ORDER — TIRZEPATIDE 5 MG/0.5ML ~~LOC~~ SOAJ
5.0000 mg | SUBCUTANEOUS | 0 refills | Status: DC
Start: 2023-07-18 — End: 2023-10-11

## 2023-07-18 NOTE — Telephone Encounter (Signed)
-----   Message from Zandra Abts sent at 07/18/2023  1:49 PM EDT ----- Patients cholesterol is not at goal and triglycerides are elevated. Your 10-year ASCVD risk score is: 11.7%, which means you are at an intermediate risk of having a cardiovascular event, such as a stroke or heart attack in the next 10 years. Recommend diet changes, participate in regular exercise, and to increase Atorvastatin to  20mg  at night from 10mg . (Dispense 90 tablets for hyperlipidemia) Diet encouraged - increase intake of fresh fruits and vegetables, increase intake of lean proteins. Bake, broil, or grill foods. Avoid fried, greasy, and fatty foods. Avoid fast foods. Increase intake of fiber-rich whole grains. Exercise encouraged - at least 150 minutes per week and advance as tolerated. Alc is stable. Continue with same dosage. All other labs are stable.

## 2023-07-18 NOTE — Progress Notes (Signed)
Complete physical exam  Patient: John Mullins   DOB: [birth year]   52 y.o. Male  MRN: [id number]  Subjective:    Chief Complaint  Patient presents with   Annual Exam    Pt is here today for annual exam Pt is FASTING Pt reports no concerns     John LUCIANO SHANNAHAN is a 52 y.o. male who presents today for a complete physical exam. He reports consuming a general diet. The patient does not participate in regular exercise at present. He generally feels well. He reports sleeping well. He does not have additional problems to discuss today.    Most recent fall risk assessment:    07/18/2023    7:48 AM  Fall Risk   Falls in the past year? 0  Number falls in past yr: 0  Injury with Fall? 0  Risk for fall due to : No Fall Risks  Follow up Falls evaluation completed     Most recent depression screenings:    07/18/2023    7:49 AM 05/22/2023    3:38 PM  PHQ 2/9 Scores  PHQ - 2 Score 0 0  PHQ- 9 Score 0 0    Vision:Within last year and Dental: No current dental problems and No regular dental care   Patient Active Problem List   Diagnosis Date Noted   Dyslipidemia 05/30/2023   Nonspecific abnormal electrocardiogram (ECG) (EKG) 05/30/2023   Hx of adenomatous polyp of colon 09/13/2022   Hyperlipidemia associated with type 2 diabetes mellitus (HCC) 08/25/2022   Normocytic anemia    Hypokalemia 06/10/2022   Hepatic steatosis 06/10/2022   Diabetes mellitus without complication (HCC) 01/27/2022   Paronychia of great toe of left foot 11/04/2021   COVID-19 vaccination declined 08/13/2021   Left adrenal mass (HCC) 08/13/2021   Hypersomnia with sleep apnea 09/01/2015   Snoring 09/01/2015   Anxiety state 06/04/2013   GERD (gastroesophageal reflux disease) 06/04/2013   Obesity, Class III, BMI 40-49.9 (morbid obesity) (HCC) 06/04/2013   Hypertension associated with diabetes (HCC)    HIATAL HERNIA 11/28/2006   Past Medical History:  Diagnosis Date   Anxiety    Diabetes mellitus without  complication (HCC)    Diverticulitis    GERD (gastroesophageal reflux disease)    Hx of adenomatous polyp of colon 09/13/2022   Single diminutive adenoma recall 2030   Hypertension    Sleep apnea    No CPap   Past Surgical History:  Procedure Laterality Date   ARTHOSCOPIC ROTAOR CUFF REPAIR Right 10/03/2019   Procedure: ARTHROSCOPIC ROTATOR CUFF REPAIR;  Surgeon: Jones Broom, MD;  Location: [facility];  Service: Orthopedics;  Laterality: Right;   SHOULDER ARTHROSCOPY WITH SUBACROMIAL DECOMPRESSION Right 10/03/2019   Procedure: SHOULDER ARTHROSCOPY WITH SUBACROMIAL DECOMPRESSION;  Surgeon: Jones Broom, MD;  Location: [facility];  Service: Orthopedics;  Laterality: Right;   Social History   Tobacco Use   Smoking status: Never   Smokeless tobacco: Never  Vaping Use   Vaping status: Never Used  Substance Use Topics   Alcohol use: No   Drug use: No   Social History   Socioeconomic History   Marital status: Married    Spouse name: Not on file   Number of children: 1   Years of education: Not on file   Highest education level: Associate degree: occupational, Scientist, product/process development, or vocational program  Occupational History   Occupation: Chief Technology Officer   Occupation: Chartered loss adjuster  Tobacco Use   Smoking status: Never   Smokeless tobacco: Never  Vaping  Use   Vaping status: Never Used  Substance and Sexual Activity   Alcohol use: No   Drug use: No   Sexual activity: Yes  Other Topics Concern   Not on file  Social History Narrative   Married-lives with spouse   Employed as an Personnel officer   Never smoker no alcohol or drugs   2 dogs   Social Determinants of Health   Financial Resource Strain: Low Risk  (05/21/2023)   Overall Financial Resource Strain (CARDIA)    Difficulty of Paying Living Expenses: Not hard at all  Food Insecurity: No Food Insecurity (05/21/2023)   Hunger Vital Sign    Worried About Running Out of Food in the Last Year: Never true    Ran Out of Food in the Last Year:  Never true  Transportation Needs: No Transportation Needs (05/21/2023)   PRAPARE - Administrator, Civil Service (Medical): No    Lack of Transportation (Non-Medical): No  Physical Activity: Sufficiently Active (05/21/2023)   Exercise Vital Sign    Days of Exercise per Week: 7 days    Minutes of Exercise per Session: 150+ min  Stress: No Stress Concern Present (05/21/2023)   [employer] of Occupational Health - Occupational Stress Questionnaire    Feeling of Stress : Only a little  Social Connections: Moderately Integrated (05/21/2023)   Social Connection and Isolation Panel [NHANES]    Frequency of Communication with Friends and Family: More than three times a week    Frequency of Social Gatherings with Friends and Family: More than three times a week    Attends Religious Services: More than 4 times per year    Active Member of [employer] or Organizations: No    Attends Banker Meetings: Not on file    Marital Status: Married  Intimate Partner Violence: Not At Risk (07/18/2023)   Humiliation, Afraid, Rape, and Kick questionnaire    Fear of Current or Ex-Partner: No    Emotionally Abused: No    Physically Abused: No    Sexually Abused: No   Family Status  Relation Name Status   Mother  Alive   Father  Deceased   Brother  Alive   Daughter  Alive   Neg Hx  (Not Specified)  No partnership data on file   Family History  Problem Relation Age of Onset   Diverticulitis Mother    Diabetes Mother    Glaucoma Mother    Heart disease Father    Stroke Father    Diabetes Father    Diverticulitis Brother    Colon cancer Neg Hx    Esophageal cancer Neg Hx    Rectal cancer Neg Hx    Stomach cancer Neg Hx    No Known Allergies   Patient Care Team: Alveria Apley, NP as PCP - General (Family Medicine)   Outpatient Medications Prior to Visit  Medication Sig   aspirin EC 81 MG tablet Take 1 tablet (81 mg total) by mouth daily. Swallow whole.    atorvastatin (LIPITOR) 10 MG tablet Take 1 tablet (10 mg total) by mouth daily.   lisinopril-hydrochlorothiazide (ZESTORETIC) 20-12.5 MG tablet TAKE 1 TABLET BY MOUTH EVERY DAY   metFORMIN (GLUCOPHAGE) 500 MG tablet Take 1 tablet (500 mg total) by mouth 2 (two) times daily with a meal.   omeprazole (PRILOSEC) 40 MG capsule Take 1 capsule (40 mg total) by mouth daily.   ondansetron (ZOFRAN-ODT) 4 MG disintegrating tablet Take 4 mg by mouth every  8 (eight) hours as needed for nausea or vomiting.   sertraline (ZOLOFT) 100 MG tablet Take 1 tablet (100 mg total) by mouth daily.   No facility-administered medications prior to visit.    Review of Systems  Constitutional: Negative.   HENT: Negative.    Eyes: Negative.   Respiratory: Negative.    Cardiovascular: Negative.   Gastrointestinal: Negative.   Genitourinary: Negative.   Musculoskeletal:  Positive for joint pain.  Skin: Negative.   Neurological: Negative.   Psychiatric/Behavioral: Negative.        Objective:   BP 122/72   Pulse 81   Temp 98.5 F (36.9 C)   Ht 6\' 1"  (1.854 m)   Wt (!) 326 lb 8 oz (148.1 kg)   SpO2 94%   BMI 43.08 kg/m    Physical Exam Vitals reviewed.  Constitutional:      General: He is not in acute distress.    Appearance: Normal appearance. He is obese. He is not ill-appearing, toxic-appearing or diaphoretic.  HENT:     Head: Normocephalic and atraumatic.     Right Ear: Tympanic membrane, ear canal and external ear normal. There is no impacted cerumen.     Left Ear: Tympanic membrane, ear canal and external ear normal. There is no impacted cerumen.     Nose:     Right Sinus: No maxillary sinus tenderness or frontal sinus tenderness.     Left Sinus: No maxillary sinus tenderness or frontal sinus tenderness.     Mouth/Throat:     Mouth: Mucous membranes are moist.     Pharynx: Oropharynx is clear. Uvula midline. No pharyngeal swelling, oropharyngeal exudate, posterior oropharyngeal erythema, uvula  swelling or postnasal drip.  Eyes:     General:        Right eye: No discharge.        Left eye: No discharge.     Conjunctiva/sclera: Conjunctivae normal.     Pupils: Pupils are equal, round, and reactive to light.  Neck:     Thyroid: No thyromegaly.  Cardiovascular:     Rate and Rhythm: Normal rate and regular rhythm.     Pulses:          Dorsalis pedis pulses are 3+ on the right side and 3+ on the left side.     Heart sounds: Normal heart sounds. No murmur heard.    No friction rub. No gallop.  Pulmonary:     Effort: Pulmonary effort is normal. No respiratory distress.     Breath sounds: Normal breath sounds.  Abdominal:     General: Abdomen is flat. Bowel sounds are normal. There is no distension.     Palpations: Abdomen is soft.     Tenderness: There is no abdominal tenderness. There is no right CVA tenderness or left CVA tenderness.  Musculoskeletal:        General: Normal range of motion.     Cervical back: Normal range of motion and neck supple. No tenderness.     Thoracic back: No tenderness.     Lumbar back: No tenderness.     Right lower leg: No edema.     Left lower leg: No edema.  Lymphadenopathy:     Cervical: No cervical adenopathy.  Skin:    General: Skin is warm and dry.  Neurological:     General: No focal deficit present.     Mental Status: He is alert and oriented to person, place, and time. Mental status is at baseline.  Motor: No weakness.     Gait: Gait normal.  Psychiatric:        Mood and Affect: Mood normal.        Behavior: Behavior normal. Behavior is cooperative.        Thought Content: Thought content normal.        Judgment: Judgment normal.       Assessment & Plan:    Routine Health Maintenance and Physical Exam  Immunization History  Administered Date(s) Administered   Influenza, Seasonal, Injecte, Preservative Fre 10/27/2012, 07/18/2023   Influenza,inj,Quad PF,6+ Mos 09/21/2016, 08/16/2017, 07/05/2019, 07/03/2020, 08/13/2021,  08/25/2022   Tdap 01/16/2010, 10/09/2020    Health Maintenance  Topic Date Due   FOOT EXAM  08/26/2023   HEMOGLOBIN A1C  10/14/2023   OPHTHALMOLOGY EXAM  03/14/2024   Diabetic kidney evaluation - eGFR measurement  04/13/2024   Diabetic kidney evaluation - Urine ACR  04/13/2024   Colonoscopy  09/02/2029   DTaP/Tdap/Td (3 - Td or Tdap) 10/09/2030   INFLUENZA VACCINE  Completed   Hepatitis C Screening  Completed   HIV Screening  Completed   HPV VACCINES  Aged Out   COVID-19 Vaccine  Discontinued   Zoster Vaccines- Shingrix  Discontinued    Discussed health benefits of physical activity, and encouraged him to engage in regular exercise appropriate for his age and condition.  Need for influenza vaccination -     Flu vaccine trivalent PF, 6mos and older(Flulaval,Afluria,Fluarix,Fluzone)  Annual physical exam  Diabetes mellitus without complication (HCC) -     Tirzepatide; Inject 5 mg into the skin once a week.  Dispense: 6 mL; Refill: 0 -     Hemoglobin A1c -     Comprehensive metabolic panel -     CBC with Differential/Platelet  Hypertension associated with diabetes (HCC) -     Comprehensive metabolic panel -     CBC with Differential/Platelet  Hyperlipidemia associated with type 2 diabetes mellitus (HCC) -     Lipid panel -     Comprehensive metabolic panel  Class 3 severe obesity due to excess calories with serious comorbidity and body mass index (BMI) of 40.0 to 44.9 in adult (HCC) -     CBC with Differential/Platelet  1.Review health maintenance: -Influenza vaccine: Ordered and administered.  2.Physical exam completed today. No concerns.  3.Ordered labs (A1c, CMP, CBC, Lipid). 4.Recommend a healthy diet and regular exercise.  5.Continue all prescribed medication. Refilled Mounjaro for diabetes.  Return in about 6 months (around 01/15/2024) for chronic management:[place].     Zandra Abts, NP

## 2023-07-18 NOTE — Patient Instructions (Signed)
-  Physical exam completed today. No concerns.  -Influenced vaccine administered today.  -Ordered labs. Office will call with lab results and you will see a copy in MyChart. -Recommend a healthy diet and regular exercise.  -Continue all prescribed medication. Refilled Mounjaro for diabetes. -Follow up in 6 months for chronic management.

## 2023-07-19 NOTE — Telephone Encounter (Signed)
Left vm  Pt viewed via MyChart

## 2023-07-28 DIAGNOSIS — I10 Essential (primary) hypertension: Secondary | ICD-10-CM | POA: Insufficient documentation

## 2023-07-28 DIAGNOSIS — G4730 Sleep apnea, unspecified: Secondary | ICD-10-CM | POA: Insufficient documentation

## 2023-07-28 DIAGNOSIS — F419 Anxiety disorder, unspecified: Secondary | ICD-10-CM | POA: Insufficient documentation

## 2023-07-31 ENCOUNTER — Ambulatory Visit: Payer: Managed Care, Other (non HMO) | Admitting: Cardiology

## 2023-08-25 ENCOUNTER — Other Ambulatory Visit: Payer: Self-pay | Admitting: Family Medicine

## 2023-08-25 DIAGNOSIS — K219 Gastro-esophageal reflux disease without esophagitis: Secondary | ICD-10-CM

## 2023-10-11 ENCOUNTER — Other Ambulatory Visit: Payer: Self-pay | Admitting: Family Medicine

## 2023-10-11 DIAGNOSIS — E119 Type 2 diabetes mellitus without complications: Secondary | ICD-10-CM

## 2023-10-11 DIAGNOSIS — E1159 Type 2 diabetes mellitus with other circulatory complications: Secondary | ICD-10-CM

## 2023-11-13 ENCOUNTER — Other Ambulatory Visit (HOSPITAL_COMMUNITY): Payer: Self-pay

## 2023-11-28 ENCOUNTER — Other Ambulatory Visit (HOSPITAL_COMMUNITY): Payer: Self-pay

## 2023-12-15 ENCOUNTER — Ambulatory Visit: Payer: Managed Care, Other (non HMO) | Admitting: Family Medicine

## 2023-12-22 ENCOUNTER — Ambulatory Visit: Payer: Managed Care, Other (non HMO) | Admitting: Family Medicine

## 2023-12-22 ENCOUNTER — Encounter: Payer: Self-pay | Admitting: Family Medicine

## 2023-12-22 VITALS — BP 132/80 | HR 84 | Ht 73.0 in | Wt 334.0 lb

## 2023-12-22 DIAGNOSIS — F419 Anxiety disorder, unspecified: Secondary | ICD-10-CM | POA: Diagnosis not present

## 2023-12-22 DIAGNOSIS — F411 Generalized anxiety disorder: Secondary | ICD-10-CM

## 2023-12-22 DIAGNOSIS — E1159 Type 2 diabetes mellitus with other circulatory complications: Secondary | ICD-10-CM | POA: Diagnosis not present

## 2023-12-22 DIAGNOSIS — E785 Hyperlipidemia, unspecified: Secondary | ICD-10-CM

## 2023-12-22 DIAGNOSIS — Z7985 Long-term (current) use of injectable non-insulin antidiabetic drugs: Secondary | ICD-10-CM

## 2023-12-22 DIAGNOSIS — K219 Gastro-esophageal reflux disease without esophagitis: Secondary | ICD-10-CM

## 2023-12-22 DIAGNOSIS — Z125 Encounter for screening for malignant neoplasm of prostate: Secondary | ICD-10-CM

## 2023-12-22 DIAGNOSIS — I152 Hypertension secondary to endocrine disorders: Secondary | ICD-10-CM

## 2023-12-22 DIAGNOSIS — E1169 Type 2 diabetes mellitus with other specified complication: Secondary | ICD-10-CM

## 2023-12-22 DIAGNOSIS — R9431 Abnormal electrocardiogram [ECG] [EKG]: Secondary | ICD-10-CM

## 2023-12-22 DIAGNOSIS — N529 Male erectile dysfunction, unspecified: Secondary | ICD-10-CM | POA: Diagnosis not present

## 2023-12-22 DIAGNOSIS — R5383 Other fatigue: Secondary | ICD-10-CM

## 2023-12-22 DIAGNOSIS — E119 Type 2 diabetes mellitus without complications: Secondary | ICD-10-CM

## 2023-12-22 MED ORDER — LISINOPRIL-HYDROCHLOROTHIAZIDE 20-12.5 MG PO TABS: 1.0000 | ORAL_TABLET | Freq: Every day | ORAL | 1 refills | Status: DC

## 2023-12-22 MED ORDER — SILDENAFIL CITRATE 50 MG PO TABS: 50.0000 mg | ORAL_TABLET | Freq: Every day | ORAL | 0 refills | Status: DC | PRN

## 2023-12-22 MED ORDER — SERTRALINE HCL 100 MG PO TABS: 100.0000 mg | ORAL_TABLET | Freq: Every day | ORAL | 1 refills | Status: DC

## 2023-12-22 MED ORDER — ATORVASTATIN CALCIUM 20 MG PO TABS
20.0000 mg | ORAL_TABLET | Freq: Every day | ORAL | 1 refills | Status: DC
Start: 2023-12-22 — End: 2024-07-02

## 2023-12-22 NOTE — Patient Instructions (Addendum)
-  Refilled medications: Zoloft, Lisinopril/HCTZ, and Atorvastatin.  -Prescribed Sildenafil 50mg , take 1 tablet as needed daily.  -Provided information about sildenafil and erectile dysfunction.  -If A1c is stable, not too low, will increase Mounjaro to 7.5mg  from 5mg . Please call when you have one shot left of Mounjaro 5 mg.  -Follow up with 6 month appointment for physical. Change March appointment to a lab visit with fasting.

## 2023-12-22 NOTE — Assessment & Plan Note (Deleted)
 Stable. Continue Atorvastatin 20mg  daily. Refilled medication. Ordered lipid and CMP. Patient is not fasting and will make an appointment when he is fasting.

## 2023-12-22 NOTE — Assessment & Plan Note (Signed)
 Stable. Continue with Lisinopril/HCTZ 20-12.5mg  daily. Refilled medication. Ordered CMP to assess potassium level and kidney function.

## 2023-12-22 NOTE — Assessment & Plan Note (Signed)
 Continue with Omeprazole 40mg  daily for GERD symptoms. It is effective with no complications.

## 2023-12-22 NOTE — Assessment & Plan Note (Signed)
Continue with Sertraline since it is effective with no complications. Refilled medication.

## 2023-12-22 NOTE — Assessment & Plan Note (Signed)
 Stable. Continue Atorvastatin 20mg  daily. Refilled medication. Ordered lipid and CMP. Patient is not fasting and will make an appointment when he is fasting.

## 2023-12-22 NOTE — Progress Notes (Signed)
 Established Patient Office Visit   Subjective:  Patient ID: John Mullins, male    DOB: Oct 16, 1971  Age: 53 y.o. MRN: [id number]  Chief Complaint  Patient presents with   Follow-up    HPI Diabetes: Chronic. Patient is taking Metformin 500mg  BID and Mounjaro 5mg  per weekly injection. Denies any abd pain, nausea, vomiting, or constipation. Denies hypoglycemia episodes. He reports he does feel his appetite is curved a little, but if he misses a meal or two, he will not feel as well.  Lab Results  Component Value Date   HGBA1C 6.2 07/18/2023      HTN: Chronic. Patient is taking Lisinopril/HCTZ 20-12.5mg  tablet daily. He denies monitoring his blood pressure at home. Denies CP, SHOB, headaches, dizziness, lightheadedness, or lower extremity edema.  BP Readings from Last 3 Encounters:  12/22/23 132/80  07/18/23 122/72  05/30/23 110/76    Hyperlipidemia: Chronic. Patient is taking Atorvastatin 20mg  daily. Patient has seen Dr. Gypsy Balsam with [facility] in [place] in July 2024. He was suppose to follow up in 2 months. In September, increased dose to 20mg  from 10mg . Pt denies having any muscle cramps or joint pain.  Lab Results  Component Value Date   CHOL 194 07/18/2023   HDL 37.90 (L) 07/18/2023   LDLCALC 120 (H) 07/18/2023   LDLDIRECT 180.0 09/06/2021   TRIG 183.0 (H) 07/18/2023   CHOLHDL 5 07/18/2023    GERD: Chronic. Patient is taking Omeprezole 40mg  daily. It is effective with no complications.   Anxiety: Chronic. Patient is taking Sertraline 100mg  daily. He reports it is effective with no complications. He does not have any outburst as previously. Denies SI and HI.   Patient reports he is having problems with keep an erection. He reports he can get an erection. He reports this has been going on for a while. He is requesting medication to help.  ROS See HPI above     Objective:   BP 132/80 (BP Location: Right Arm, Patient Position: Sitting, Cuff Size: Large)    Pulse 84   Ht 6\' 1"  (1.854 m)   Wt (!) 334 lb (151.5 kg)   SpO2 94%   BMI 44.07 kg/m    Physical Exam Vitals reviewed.  Constitutional:      General: He is not in acute distress.    Appearance: Normal appearance. He is obese. He is not ill-appearing, toxic-appearing or diaphoretic.  HENT:     Head: Normocephalic and atraumatic.  Eyes:     General:        Right eye: No discharge.        Left eye: No discharge.     Conjunctiva/sclera: Conjunctivae normal.  Cardiovascular:     Rate and Rhythm: Normal rate and regular rhythm.     Pulses:          Posterior tibial pulses are 2+ on the right side and 2+ on the left side.     Heart sounds: Normal heart sounds. No murmur heard.    No friction rub. No gallop.  Pulmonary:     Effort: Pulmonary effort is normal. No respiratory distress.     Breath sounds: Normal breath sounds.  Musculoskeletal:        General: Normal range of motion.  Feet:     Right foot:     Protective Sensation: 10 sites tested.  10 sites sensed.     Skin integrity: Skin integrity normal.     Toenail Condition: Right toenails are normal.  Left foot:     Protective Sensation: 10 sites tested.  10 sites sensed.     Skin integrity: Skin integrity normal.     Toenail Condition: Left toenails are normal.  Skin:    General: Skin is warm and dry.  Neurological:     General: No focal deficit present.     Mental Status: He is alert and oriented to person, place, and time. Mental status is at baseline.  Psychiatric:        Mood and Affect: Mood normal.        Behavior: Behavior normal.        Thought Content: Thought content normal.        Judgment: Judgment normal.   -EKG completed since patient is wanting to go Phosphodiesterase-5 inhibitor (PDE5-I) and he has an abnormal EKG previously. Sinus rhythm with no ST elevation. No negative change from EKG in 05/2023.    Assessment & Plan:  Diabetes mellitus without complication (HCC) Assessment & Plan: Stable.  Continue with Metformin 500mg  BID and Mounjaro 5mg  per weekly injection. Ordered A1c. If A1c is stable, not too low, can increase Mounjaro to 7.5mg  weekly. Foot exam completed.   Orders: -     Hemoglobin A1c; Future -     Comprehensive metabolic panel; Future  Hypertension associated with diabetes (HCC) Assessment & Plan: Stable. Continue with Lisinopril/HCTZ 20-12.5mg  daily. Refilled medication. Ordered CMP to assess potassium level and kidney function.   Orders: -     Comprehensive metabolic panel; Future -     Lisinopril-hydroCHLOROthiazide; Take 1 tablet by mouth daily.  Dispense: 90 tablet; Refill: 1  Dyslipidemia Assessment & Plan: Stable. Continue Atorvastatin 20mg  daily. Refilled medication. Ordered lipid and CMP. Patient is not fasting and will make an appointment when he is fasting.   Orders: -     Lipid panel; Future -     Comprehensive metabolic panel; Future  Gastroesophageal reflux disease without esophagitis Assessment & Plan: Continue with Omeprazole 40mg  daily for GERD symptoms. It is effective with no complications.      Anxiety Assessment & Plan: Continue with Sertraline since it is effective with no complications. Refilled medication.    Orders: -     Sertraline HCl; Take 1 tablet (100 mg total) by mouth daily.  Dispense: 90 tablet; Refill: 1  Hyperlipidemia associated with type 2 diabetes mellitus (HCC) -     Atorvastatin Calcium; Take 1 tablet (20 mg total) by mouth daily.  Dispense: 90 tablet; Refill: 1  Anxiety state  Fatigue, unspecified type -     Testosterone; Future -     CBC with Differential/Platelet; Future  Prostate cancer screening -     PSA; Future  Erectile dysfunction, unspecified erectile dysfunction type -     Sildenafil Citrate; Take 1 tablet (50 mg total) by mouth daily as needed for up to 30 doses for erectile dysfunction.  Dispense: 30 tablet; Refill: 0 -     EKG 12-Lead  Nonspecific abnormal electrocardiogram (ECG)  (EKG) -     EKG 12-Lead  1.Review health maintenance: -PNA vaccine: Declines  -Covid booster: Declines  -Zoster vaccine: Declines   2.EKG performed since starting Sildenafil and had a prior abnormal EKG. No concerns. 3.Prescribed Sildenafil 50mg , take 1 tablet as needed daily. Provided information about sildenafil and erectile dysfunction.  4. Ordered PSA, Testosterone and CBC for fatigue and PSA screening.   Return in about 6 months (around 06/20/2024) for  Lab appt for fasting; d/c March , physical.  Zandra Abts, NP

## 2023-12-22 NOTE — Assessment & Plan Note (Addendum)
 Stable. Continue with Metformin 500mg  BID and Mounjaro 5mg  per weekly injection. Ordered A1c. If A1c is stable, not too low, can increase Mounjaro to 7.5mg  weekly. Foot exam completed.

## 2024-01-15 ENCOUNTER — Other Ambulatory Visit: Payer: Managed Care, Other (non HMO)

## 2024-01-15 ENCOUNTER — Ambulatory Visit: Payer: Managed Care, Other (non HMO) | Admitting: Family Medicine

## 2024-01-19 ENCOUNTER — Telehealth: Payer: Self-pay | Admitting: Family Medicine

## 2024-01-19 ENCOUNTER — Other Ambulatory Visit: Payer: Self-pay | Admitting: Family Medicine

## 2024-01-19 DIAGNOSIS — E119 Type 2 diabetes mellitus without complications: Secondary | ICD-10-CM

## 2024-01-19 MED ORDER — TIRZEPATIDE 7.5 MG/0.5ML ~~LOC~~ SOAJ
7.5000 mg | SUBCUTANEOUS | 0 refills | Status: DC
Start: 2024-01-19 — End: 2024-02-26

## 2024-01-19 MED ORDER — MOUNJARO 5 MG/0.5ML ~~LOC~~ SOAJ: 7.5000 mg | SUBCUTANEOUS | 1 refills | Status: DC

## 2024-01-19 NOTE — Addendum Note (Signed)
 Addended by: Kenna Gilbert B on: 01/19/2024 04:39 PM   Modules accepted: Orders

## 2024-01-19 NOTE — Telephone Encounter (Signed)
 Copied from CRM [id number]. Topic: Clinical - Prescription Issue >> Jan 19, 2024  1:51 PM Sim Boast F wrote: Reason for CRM: Patient called requesting refill for [facility], says the refill is suppose to be 7.5mg  NOT the 5mg  as discusedd with provider at last visit. He asked for this to be sent to the [facility] on [facility].

## 2024-01-22 ENCOUNTER — Other Ambulatory Visit (INDEPENDENT_AMBULATORY_CARE_PROVIDER_SITE_OTHER)

## 2024-01-22 ENCOUNTER — Encounter: Payer: Self-pay | Admitting: Family Medicine

## 2024-01-22 DIAGNOSIS — R5383 Other fatigue: Secondary | ICD-10-CM

## 2024-01-22 DIAGNOSIS — E119 Type 2 diabetes mellitus without complications: Secondary | ICD-10-CM | POA: Diagnosis not present

## 2024-01-22 DIAGNOSIS — E785 Hyperlipidemia, unspecified: Secondary | ICD-10-CM | POA: Diagnosis not present

## 2024-01-22 DIAGNOSIS — I152 Hypertension secondary to endocrine disorders: Secondary | ICD-10-CM

## 2024-01-22 DIAGNOSIS — Z125 Encounter for screening for malignant neoplasm of prostate: Secondary | ICD-10-CM | POA: Diagnosis not present

## 2024-01-22 LAB — COMPREHENSIVE METABOLIC PANEL
ALT: 40 U/L (ref 0–53)
AST: 23 U/L (ref 0–37)
Albumin: 4.6 g/dL (ref 3.5–5.2)
Alkaline Phosphatase: 88 U/L (ref 39–117)
BUN: 14 mg/dL (ref 6–23)
CO2: 28 meq/L (ref 19–32)
Calcium: 9.9 mg/dL (ref 8.4–10.5)
Chloride: 102 meq/L (ref 96–112)
Creatinine, Ser: 0.98 mg/dL (ref 0.40–1.50)
GFR: 87.96 mL/min (ref >60.00)
Glucose, Bld: 139 mg/dL — ABNORMAL HIGH (ref 70–99)
Potassium: 4.3 meq/L (ref 3.5–5.1)
Sodium: 138 meq/L (ref 135–145)
Total Bilirubin: 0.5 mg/dL (ref 0.2–1.2)
Total Protein: 7.4 g/dL (ref 6.0–8.3)

## 2024-01-22 LAB — CBC WITH DIFFERENTIAL/PLATELET
Basophils Absolute: 0.1 10*3/uL (ref 0.0–0.1)
Basophils Relative: 0.8 % (ref 0.0–3.0)
Eosinophils Absolute: 0.1 10*3/uL (ref 0.0–0.7)
Eosinophils Relative: 1.3 % (ref 0.0–5.0)
HCT: 43.1 % (ref 39.0–52.0)
Hemoglobin: 14.1 g/dL (ref 13.0–17.0)
Lymphocytes Relative: 23.7 % (ref 12.0–46.0)
Lymphs Abs: 2.7 10*3/uL (ref 0.7–4.0)
MCHC: 32.6 g/dL (ref 30.0–36.0)
MCV: 86 fl (ref 78.0–100.0)
Monocytes Absolute: 0.5 10*3/uL (ref 0.1–1.0)
Monocytes Relative: 4.4 % (ref 3.0–12.0)
Neutro Abs: 8 10*3/uL — ABNORMAL HIGH (ref 1.4–7.7)
Neutrophils Relative %: 69.8 % (ref 43.0–77.0)
Platelets: 349 10*3/uL (ref 150.0–400.0)
RBC: 5.01 Mil/uL (ref 4.22–5.81)
RDW: 15.4 % (ref 11.5–15.5)
WBC: 11.4 10*3/uL — ABNORMAL HIGH (ref 4.0–10.5)

## 2024-01-22 LAB — TESTOSTERONE: Testosterone: 212.37 ng/dL — ABNORMAL LOW (ref 300.00–890.00)

## 2024-01-22 LAB — LIPID PANEL
Cholesterol: 143 mg/dL (ref 0–200)
HDL: 35.7 mg/dL — ABNORMAL LOW (ref >39.00)
LDL Cholesterol: 70 mg/dL (ref 0–99)
NonHDL: 107.28
Total CHOL/HDL Ratio: 4
Triglycerides: 184 mg/dL — ABNORMAL HIGH (ref 0.0–149.0)
VLDL: 36.8 mg/dL (ref 0.0–40.0)

## 2024-01-22 LAB — HEMOGLOBIN A1C: Hgb A1c MFr Bld: 6.2 % (ref 4.6–6.5)

## 2024-01-22 LAB — PSA: PSA: 1.76 ng/mL (ref 0.10–4.00)

## 2024-01-26 ENCOUNTER — Other Ambulatory Visit: Payer: Self-pay

## 2024-01-26 ENCOUNTER — Other Ambulatory Visit

## 2024-01-26 DIAGNOSIS — R7989 Other specified abnormal findings of blood chemistry: Secondary | ICD-10-CM

## 2024-01-26 LAB — TESTOSTERONE: Testosterone: 193.81 ng/dL — ABNORMAL LOW (ref 300.00–890.00)

## 2024-01-29 ENCOUNTER — Encounter: Payer: Self-pay | Admitting: Family Medicine

## 2024-01-31 ENCOUNTER — Other Ambulatory Visit: Payer: Self-pay | Admitting: Family Medicine

## 2024-01-31 DIAGNOSIS — R7989 Other specified abnormal findings of blood chemistry: Secondary | ICD-10-CM

## 2024-01-31 MED ORDER — TESTOSTERONE CYPIONATE 200 MG/ML IJ SOLN: 200.0000 mg | INTRAMUSCULAR | 0 refills | Status: DC

## 2024-01-31 NOTE — Progress Notes (Signed)
 Patient has had two low testosterone levels. He would like to start therapy with injections.

## 2024-02-05 ENCOUNTER — Telehealth: Payer: Self-pay | Admitting: Family Medicine

## 2024-02-05 NOTE — Telephone Encounter (Signed)
 Copied from CRM [id number]. Topic: Clinical - Prescription Issue >> Feb 05, 2024  1:12 PM Turkey A wrote: Reason for CRM: Patient's wife called in and said there were no syringes for patient to take Testosterone Cypionate 200 MG/ML SOLN

## 2024-02-26 ENCOUNTER — Other Ambulatory Visit: Payer: Self-pay | Admitting: Family Medicine

## 2024-02-26 DIAGNOSIS — E119 Type 2 diabetes mellitus without complications: Secondary | ICD-10-CM

## 2024-04-02 ENCOUNTER — Other Ambulatory Visit: Payer: Self-pay | Admitting: Family Medicine

## 2024-04-02 DIAGNOSIS — E119 Type 2 diabetes mellitus without complications: Secondary | ICD-10-CM

## 2024-04-02 DIAGNOSIS — K219 Gastro-esophageal reflux disease without esophagitis: Secondary | ICD-10-CM

## 2024-04-02 DIAGNOSIS — N529 Male erectile dysfunction, unspecified: Secondary | ICD-10-CM

## 2024-05-02 ENCOUNTER — Other Ambulatory Visit: Payer: Self-pay | Admitting: Family Medicine

## 2024-05-02 DIAGNOSIS — E119 Type 2 diabetes mellitus without complications: Secondary | ICD-10-CM

## 2024-05-04 ENCOUNTER — Other Ambulatory Visit: Payer: Self-pay | Admitting: Family Medicine

## 2024-05-04 DIAGNOSIS — N529 Male erectile dysfunction, unspecified: Secondary | ICD-10-CM

## 2024-05-06 ENCOUNTER — Encounter: Payer: Self-pay | Admitting: Family Medicine

## 2024-05-06 DIAGNOSIS — E119 Type 2 diabetes mellitus without complications: Secondary | ICD-10-CM

## 2024-05-07 ENCOUNTER — Other Ambulatory Visit (HOSPITAL_COMMUNITY): Payer: Self-pay

## 2024-05-07 ENCOUNTER — Telehealth: Payer: Self-pay

## 2024-05-07 MED ORDER — MOUNJARO 7.5 MG/0.5ML ~~LOC~~ SOAJ: 7.5000 mg | SUBCUTANEOUS | 1 refills | Status: DC

## 2024-05-07 NOTE — Telephone Encounter (Signed)
 Pharmacy Patient Advocate Encounter   Received notification from Onbase that prior authorization for Mounjaro  7.5MG /0.5ML auto-injectors is due for renewal.   Insurance verification completed.   The patient is insured through [employer].  Action: PA required; PA submitted to above mentioned insurance via CoverMyMeds Key/confirmation #/EOC [employer] Status is pending

## 2024-05-10 NOTE — Telephone Encounter (Signed)
 Pharmacy Patient Advocate Encounter  Received notification from [employer] that Prior Authorization for Mounjaro  7.5MG /0.5ML auto-injectors has been APPROVED from 05/07/24 to 05/09/25   PA #/Case ID/Reference #: [id number]

## 2024-07-02 ENCOUNTER — Other Ambulatory Visit: Payer: Self-pay | Admitting: Family Medicine

## 2024-07-02 DIAGNOSIS — E1169 Type 2 diabetes mellitus with other specified complication: Secondary | ICD-10-CM

## 2024-07-19 ENCOUNTER — Other Ambulatory Visit: Payer: Self-pay | Admitting: Family Medicine

## 2024-07-19 DIAGNOSIS — E1159 Type 2 diabetes mellitus with other circulatory complications: Secondary | ICD-10-CM

## 2024-07-19 DIAGNOSIS — F419 Anxiety disorder, unspecified: Secondary | ICD-10-CM

## 2024-07-26 ENCOUNTER — Other Ambulatory Visit (HOSPITAL_COMMUNITY): Payer: Self-pay

## 2024-07-26 ENCOUNTER — Telehealth

## 2024-07-26 NOTE — Telephone Encounter (Signed)
 Pharmacy Patient Advocate Encounter   Received notification from Onbase that prior authorization for Mounjaro  7.5 is required/requested.   Insurance verification completed.   The patient is insured through [employer] .   Per test claim: [employer]/ Rx Benefits report patient not found. Please advise

## 2024-07-26 NOTE — Telephone Encounter (Signed)
 noted

## 2024-07-29 ENCOUNTER — Other Ambulatory Visit (HOSPITAL_COMMUNITY): Payer: Self-pay

## 2024-07-29 NOTE — Telephone Encounter (Signed)
 Pharmacy Patient Advocate Encounter   Received notification from Pt Calls Messages that prior authorization for MOUNJARO  7.5 MG/0.5 ML PEN is required/requested.   Insurance verification completed.   The patient is insured through [employer] .   Per test claim: PA required; PA submitted to above mentioned insurance via Prompt PA Key/confirmation #/EOC [id number] Status is pending

## 2024-07-30 ENCOUNTER — Telehealth: Payer: Self-pay | Admitting: Pharmacy Technician

## 2024-07-30 ENCOUNTER — Other Ambulatory Visit (HOSPITAL_COMMUNITY): Payer: Self-pay

## 2024-07-30 NOTE — Telephone Encounter (Signed)
 Pharmacy Patient Advocate Encounter  Received notification from [employer] that Prior Authorization for Mounjaro  7.5mg  has been APPROVED from 07/29/2024 to 10/30/2098.   PA #/Case ID/Reference #: [id number]   Approval letter has been uploaded to the media tab.

## 2024-08-01 ENCOUNTER — Other Ambulatory Visit (HOSPITAL_COMMUNITY): Payer: Self-pay

## 2024-08-05 ENCOUNTER — Other Ambulatory Visit: Payer: Self-pay | Admitting: Family Medicine

## 2024-08-05 DIAGNOSIS — E119 Type 2 diabetes mellitus without complications: Secondary | ICD-10-CM

## 2024-08-05 NOTE — Telephone Encounter (Unsigned)
 Copied from CRM [id number]. Topic: Clinical - Medication Refill >> Aug 05, 2024  2:40 PM Mesmerise C wrote: Medication:  tirzepatide  (MOUNJARO ) 7.5 MG/0.5ML Pen    Has the patient contacted their pharmacy? Yes (Agent: If no, request that the patient contact the pharmacy for the refill. If patient does not wish to contact the pharmacy document the reason why and proceed with request.) (Agent: If yes, when and what did the pharmacy advise?)  This is the patient's preferred pharmacy:  [id number] [place], [place] - [address] [facility] [place] [address] Phone: [phone] Fax: [phone]  Is this the correct pharmacy for this prescription? Yes If no, delete pharmacy and type the correct one.   Has the prescription been filled recently? No  Is the patient out of the medication? Yes  Has the patient been seen for an appointment in the last year OR does the patient have an upcoming appointment? Yes  Can we respond through MyChart? Yes  Agent: Please be advised that Rx refills may take up to 3 business days. We ask that you follow-up with your pharmacy.

## 2024-08-06 MED ORDER — MOUNJARO 7.5 MG/0.5ML ~~LOC~~ SOAJ: 7.5000 mg | SUBCUTANEOUS | 1 refills | Status: DC

## 2024-08-08 ENCOUNTER — Other Ambulatory Visit (HOSPITAL_COMMUNITY): Payer: Self-pay

## 2024-08-27 ENCOUNTER — Encounter: Payer: Self-pay | Admitting: Family Medicine

## 2024-08-27 ENCOUNTER — Other Ambulatory Visit (HOSPITAL_COMMUNITY): Payer: Self-pay

## 2024-08-27 ENCOUNTER — Ambulatory Visit: Admitting: Family Medicine

## 2024-08-27 VITALS — BP 130/86 | HR 72 | Temp 98.1°F | Ht 73.0 in | Wt 319.0 lb

## 2024-08-27 DIAGNOSIS — I152 Hypertension secondary to endocrine disorders: Secondary | ICD-10-CM

## 2024-08-27 DIAGNOSIS — E119 Type 2 diabetes mellitus without complications: Secondary | ICD-10-CM | POA: Diagnosis not present

## 2024-08-27 DIAGNOSIS — E1159 Type 2 diabetes mellitus with other circulatory complications: Secondary | ICD-10-CM | POA: Diagnosis not present

## 2024-08-27 DIAGNOSIS — K219 Gastro-esophageal reflux disease without esophagitis: Secondary | ICD-10-CM

## 2024-08-27 DIAGNOSIS — Z23 Encounter for immunization: Secondary | ICD-10-CM

## 2024-08-27 DIAGNOSIS — E785 Hyperlipidemia, unspecified: Secondary | ICD-10-CM | POA: Diagnosis not present

## 2024-08-27 DIAGNOSIS — N529 Male erectile dysfunction, unspecified: Secondary | ICD-10-CM

## 2024-08-27 DIAGNOSIS — Z125 Encounter for screening for malignant neoplasm of prostate: Secondary | ICD-10-CM

## 2024-08-27 DIAGNOSIS — E1169 Type 2 diabetes mellitus with other specified complication: Secondary | ICD-10-CM

## 2024-08-27 DIAGNOSIS — R7989 Other specified abnormal findings of blood chemistry: Secondary | ICD-10-CM | POA: Diagnosis not present

## 2024-08-27 DIAGNOSIS — E66813 Obesity, class 3: Secondary | ICD-10-CM | POA: Diagnosis not present

## 2024-08-27 DIAGNOSIS — F419 Anxiety disorder, unspecified: Secondary | ICD-10-CM

## 2024-08-27 DIAGNOSIS — Z0000 Encounter for general adult medical examination without abnormal findings: Secondary | ICD-10-CM

## 2024-08-27 LAB — COMPREHENSIVE METABOLIC PANEL WITH GFR
ALT: 27 U/L (ref 0–53)
AST: 19 U/L (ref 0–37)
Albumin: 4.6 g/dL (ref 3.5–5.2)
Alkaline Phosphatase: 95 U/L (ref 39–117)
BUN: 15 mg/dL (ref 6–23)
CO2: 25 meq/L (ref 19–32)
Calcium: 9.5 mg/dL (ref 8.4–10.5)
Chloride: 105 meq/L (ref 96–112)
Creatinine, Ser: 0.83 mg/dL (ref 0.40–1.50)
GFR: 99.42 mL/min (ref >60.00)
Glucose, Bld: 98 mg/dL (ref 70–99)
Potassium: 4.2 meq/L (ref 3.5–5.1)
Sodium: 139 meq/L (ref 135–145)
Total Bilirubin: 0.4 mg/dL (ref 0.2–1.2)
Total Protein: 7.1 g/dL (ref 6.0–8.3)

## 2024-08-27 LAB — HEMOGLOBIN A1C: Hgb A1c MFr Bld: 6.2 % (ref 4.6–6.5)

## 2024-08-27 LAB — CBC WITH DIFFERENTIAL/PLATELET
Basophils Absolute: 0.1 K/uL (ref 0.0–0.1)
Basophils Relative: 0.9 % (ref 0.0–3.0)
Eosinophils Absolute: 0.2 K/uL (ref 0.0–0.7)
Eosinophils Relative: 1.5 % (ref 0.0–5.0)
HCT: 41.1 % (ref 39.0–52.0)
Hemoglobin: 13.5 g/dL (ref 13.0–17.0)
Lymphocytes Relative: 20.2 % (ref 12.0–46.0)
Lymphs Abs: 2.5 K/uL (ref 0.7–4.0)
MCHC: 32.9 g/dL (ref 30.0–36.0)
MCV: 87.3 fl (ref 78.0–100.0)
Monocytes Absolute: 0.7 K/uL (ref 0.1–1.0)
Monocytes Relative: 5.8 % (ref 3.0–12.0)
Neutro Abs: 8.7 K/uL — ABNORMAL HIGH (ref 1.4–7.7)
Neutrophils Relative %: 71.6 % (ref 43.0–77.0)
Platelets: 365 K/uL (ref 150.0–400.0)
RBC: 4.71 Mil/uL (ref 4.22–5.81)
RDW: 14.2 % (ref 11.5–15.5)
WBC: 12.2 K/uL — ABNORMAL HIGH (ref 4.0–10.5)

## 2024-08-27 LAB — LIPID PANEL
Cholesterol: 130 mg/dL (ref 0–200)
HDL: 35.9 mg/dL — ABNORMAL LOW (ref >39.00)
LDL Cholesterol: 70 mg/dL (ref 0–99)
NonHDL: 94.48
Total CHOL/HDL Ratio: 4
Triglycerides: 122 mg/dL (ref 0.0–149.0)
VLDL: 24.4 mg/dL (ref 0.0–40.0)

## 2024-08-27 LAB — PSA: PSA: 1.54 ng/mL (ref 0.10–4.00)

## 2024-08-27 LAB — MICROALBUMIN / CREATININE URINE RATIO
Creatinine,U: 127.5 mg/dL
Microalb Creat Ratio: 5.7 mg/g (ref 0.0–30.0)
Microalb, Ur: 0.7 mg/dL (ref 0.0–1.9)

## 2024-08-27 LAB — TSH: TSH: 1.88 u[IU]/mL (ref 0.35–5.50)

## 2024-08-27 LAB — TESTOSTERONE: Testosterone: 198.46 ng/dL — ABNORMAL LOW (ref 300.00–890.00)

## 2024-08-27 MED ORDER — METFORMIN HCL 500 MG PO TABS: 500.0000 mg | ORAL_TABLET | Freq: Two times a day (BID) | ORAL | 1 refills | Status: AC

## 2024-08-27 MED ORDER — TIRZEPATIDE 10 MG/0.5ML ~~LOC~~ SOAJ: 10.0000 mg | SUBCUTANEOUS | 0 refills | Status: AC

## 2024-08-27 MED ORDER — OMEPRAZOLE 40 MG PO CPDR: 40.0000 mg | DELAYED_RELEASE_CAPSULE | Freq: Every day | ORAL | 1 refills | Status: AC

## 2024-08-27 MED ORDER — TESTOSTERONE CYPIONATE 200 MG/ML IJ SOLN
200.0000 mg | INTRAMUSCULAR | 0 refills | Status: AC
Start: 2024-08-27 — End: 2024-11-06

## 2024-08-27 MED ORDER — ATORVASTATIN CALCIUM 20 MG PO TABS: 20.0000 mg | ORAL_TABLET | Freq: Every day | ORAL | 1 refills | Status: AC

## 2024-08-27 MED ORDER — SILDENAFIL CITRATE 50 MG PO TABS: 50.0000 mg | ORAL_TABLET | Freq: Every day | ORAL | 0 refills | Status: AC | PRN

## 2024-08-27 MED ORDER — SERTRALINE HCL 100 MG PO TABS: 100.0000 mg | ORAL_TABLET | Freq: Every day | ORAL | 1 refills | Status: AC

## 2024-08-27 MED ORDER — LISINOPRIL-HYDROCHLOROTHIAZIDE 20-12.5 MG PO TABS: 1.0000 | ORAL_TABLET | Freq: Every day | ORAL | 1 refills | Status: AC

## 2024-08-27 NOTE — Progress Notes (Signed)
 Complete physical exam  Patient: John Mullins   DOB: May 18, 1971   53 y.o. Male  MRN: [id number]  Subjective:    Chief Complaint  Patient presents with   Annual Exam    John Mullins is a 53 y.o. male who presents today for a complete physical exam. He reports consuming a low carbohydrate diet. The patient has a physically strenuous job, but has no regular exercise apart from work.  He generally feels well. He reports sleeping well. He does not have additional problems to discuss today.    Most recent fall risk assessment:    08/27/2024    7:25 AM  Fall Risk   Falls in the past year? 0  Number falls in past yr: 0  Injury with Fall? 0  Risk for fall due to : No Fall Risks  Follow up Falls evaluation completed     Most recent depression screenings:    08/27/2024    7:25 AM 07/18/2023    7:49 AM  PHQ 2/9 Scores  PHQ - 2 Score 0 0  PHQ- 9 Score 0 0    Vision:Not within last year  and Dental: No current dental problems and Receives regular dental care  Past Medical History:  Diagnosis Date   Anxiety 06/04/2013   Anxiety state 06/04/2013   COVID-19 vaccination declined 08/13/2021   Diabetes mellitus without complication (HCC)    Dyslipidemia 05/30/2023   GERD (gastroesophageal reflux disease)    Hepatic steatosis 06/10/2022   Hx of adenomatous polyp of colon 09/13/2022   Single diminutive adenoma recall 2030   Hyperlipidemia associated with type 2 diabetes mellitus (HCC) 08/25/2022   Hypersomnia with sleep apnea 09/01/2015   Hypertension    Hypertension associated with diabetes (HCC)    Hypokalemia 06/10/2022   Left adrenal mass 08/13/2021   Nonspecific abnormal electrocardiogram (ECG) (EKG) 05/30/2023   Normocytic anemia    Obesity, Class III, BMI 40-49.9 (morbid obesity) (HCC) 06/04/2013   Paronychia of great toe of left foot 11/04/2021   Sleep apnea    No CPap   Snoring 09/01/2015   Past Surgical History:  Procedure Laterality Date   ARTHOSCOPIC ROTAOR  CUFF REPAIR Right 10/03/2019   Procedure: ARTHROSCOPIC ROTATOR CUFF REPAIR;  Surgeon: Dozier Soulier, MD;  Location: [facility];  Service: Orthopedics;  Laterality: Right;   SHOULDER ARTHROSCOPY WITH SUBACROMIAL DECOMPRESSION Right 10/03/2019   Procedure: SHOULDER ARTHROSCOPY WITH SUBACROMIAL DECOMPRESSION;  Surgeon: Dozier Soulier, MD;  Location: [facility];  Service: Orthopedics;  Laterality: Right;   Social History   Tobacco Use   Smoking status: Never   Smokeless tobacco: Never  Vaping Use   Vaping status: Never Used  Substance Use Topics   Alcohol use: No   Drug use: No   Social History   Socioeconomic History   Marital status: Married    Spouse name: Not on file   Number of children: 1   Years of education: Not on file   Highest education level: Associate degree: occupational, scientist, product/process development, or vocational program  Occupational History   Occupation: chief technology officer   Occupation: Chartered Loss Adjuster  Tobacco Use   Smoking status: Never   Smokeless tobacco: Never  Vaping Use   Vaping status: Never Used  Substance and Sexual Activity   Alcohol use: No   Drug use: No   Sexual activity: Yes  Other Topics Concern   Not on file  Social History Narrative   Married-lives with spouse   Employed as an personnel officer   Never smoker  no alcohol or drugs   2 dogs   Social Drivers of Corporate Investment Banker Strain: Low Risk  (05/21/2023)   Overall Financial Resource Strain (CARDIA)    Difficulty of Paying Living Expenses: Not hard at all  Food Insecurity: No Food Insecurity (05/21/2023)   Hunger Vital Sign    Worried About Running Out of Food in the Last Year: Never true    Ran Out of Food in the Last Year: Never true  Transportation Needs: No Transportation Needs (05/21/2023)   PRAPARE - Administrator, Civil Service (Medical): No    Lack of Transportation (Non-Medical): No  Physical Activity: Sufficiently Active (05/21/2023)   Exercise Vital Sign    Days of Exercise per Week: 7  days    Minutes of Exercise per Session: 150+ min  Stress: No Stress Concern Present (05/21/2023)   [employer] of Occupational Health - Occupational Stress Questionnaire    Feeling of Stress : Only a little  Social Connections: Moderately Integrated (05/21/2023)   Social Connection and Isolation Panel    Frequency of Communication with Friends and Family: More than three times a week    Frequency of Social Gatherings with Friends and Family: More than three times a week    Attends Religious Services: More than 4 times per year    Active Member of [employer] or Organizations: No    Attends Banker Meetings: Not on file    Marital Status: Married  Intimate Partner Violence: Not At Risk (07/18/2023)   Humiliation, Afraid, Rape, and Kick questionnaire    Fear of Current or Ex-Partner: No    Emotionally Abused: No    Physically Abused: No    Sexually Abused: No   Family Status  Relation Name Status   Mother  Alive   Father  Deceased   Brother  Alive   Daughter  Alive   Neg Hx  (Not Specified)  No partnership data on file   Family History  Problem Relation Age of Onset   Diverticulitis Mother    Diabetes Mother    Glaucoma Mother    Heart disease Father    Stroke Father    Diabetes Father    Diverticulitis Brother    Colon cancer Neg Hx    Esophageal cancer Neg Hx    Rectal cancer Neg Hx    Stomach cancer Neg Hx    No Known Allergies   Patient Care Team: Billy Philippe SAUNDERS, NP as PCP - General (Family Medicine)   Outpatient Medications Prior to Visit  Medication Sig   aspirin  EC 81 MG tablet Take 1 tablet (81 mg total) by mouth daily. Swallow whole.   Multiple Vitamins-Minerals (MULTIVITAMIN ADULT, MINERALS, PO) Take 1 tablet by mouth daily.   ondansetron  (ZOFRAN -ODT) 4 MG disintegrating tablet Take 4 mg by mouth every 8 (eight) hours as needed for nausea or vomiting.   [DISCONTINUED] atorvastatin  (LIPITOR) 20 MG tablet TAKE 1 TABLET BY MOUTH EVERY DAY    [DISCONTINUED] lisinopril -hydrochlorothiazide  (ZESTORETIC ) 20-12.5 MG tablet TAKE 1 TABLET BY MOUTH EVERY DAY   [DISCONTINUED] metFORMIN  (GLUCOPHAGE ) 500 MG tablet TAKE 1 TABLET BY MOUTH TWICE DAILY WITH A MEAL   [DISCONTINUED] omeprazole  (PRILOSEC) 40 MG capsule TAKE 1 CAPSULE BY MOUTH EVERY DAY   [DISCONTINUED] sertraline  (ZOLOFT ) 100 MG tablet TAKE 1 TABLET BY MOUTH EVERY DAY   [DISCONTINUED] sildenafil  (VIAGRA ) 50 MG tablet TAKE 1 TABLET (50 MG TOTAL) BY MOUTH DAILY AS NEEDED FOR UP TO 30  DOSES FOR ERECTILE DYSFUNCTION.   [DISCONTINUED] Testosterone  Cypionate 200 MG/ML SOLN Inject 200 mg as directed every 14 (fourteen) days for 6 doses.   [DISCONTINUED] tirzepatide  (MOUNJARO ) 7.5 MG/0.5ML Pen Inject 7.5 mg into the skin once a week for 24 doses.   No facility-administered medications prior to visit.    Review of Systems  Constitutional: Negative.   HENT: Negative.    Eyes: Negative.   Respiratory: Negative.    Cardiovascular: Negative.   Gastrointestinal: Negative.   Genitourinary: Negative.   Musculoskeletal: Negative.   Skin: Negative.   Neurological: Negative.   Endo/Heme/Allergies: Negative.   Psychiatric/Behavioral: Negative.     See HPI above    Objective:   BP 130/86   Pulse 72   Temp 98.1 F (36.7 C) (Oral)   Ht 6' 1 (1.854 m)   Wt (!) 319 lb (144.7 kg)   SpO2 97%   BMI 42.09 kg/m    Physical Exam Vitals reviewed.  Constitutional:      General: He is not in acute distress.    Appearance: Normal appearance. He is morbidly obese. He is not ill-appearing or toxic-appearing.  HENT:     Head: Normocephalic and atraumatic.     Right Ear: Tympanic membrane, ear canal and external ear normal. There is no impacted cerumen.     Left Ear: Tympanic membrane, ear canal and external ear normal. There is no impacted cerumen.     Nose:     Right Sinus: No maxillary sinus tenderness or frontal sinus tenderness.     Left Sinus: No maxillary sinus tenderness or frontal  sinus tenderness.     Mouth/Throat:     Mouth: Mucous membranes are moist.     Pharynx: Oropharynx is clear. Uvula midline. No pharyngeal swelling, oropharyngeal exudate, posterior oropharyngeal erythema or uvula swelling.  Eyes:     General:        Right eye: No discharge.        Left eye: No discharge.     Conjunctiva/sclera: Conjunctivae normal.     Pupils: Pupils are equal, round, and reactive to light.  Neck:     Thyroid: No thyromegaly.  Cardiovascular:     Rate and Rhythm: Normal rate and regular rhythm.     Pulses:          Posterior tibial pulses are 2+ on the right side and 2+ on the left side.     Heart sounds: Normal heart sounds. No murmur heard.    No friction rub. No gallop.  Pulmonary:     Effort: Pulmonary effort is normal. No respiratory distress.     Breath sounds: Normal breath sounds.  Abdominal:     General: Abdomen is flat. Bowel sounds are normal. There is no distension.     Palpations: Abdomen is soft. There is no mass.     Tenderness: There is no abdominal tenderness. There is no right CVA tenderness or left CVA tenderness.  Musculoskeletal:        General: Normal range of motion.     Cervical back: Normal range of motion.     Right lower leg: No edema.     Left lower leg: No edema.  Lymphadenopathy:     Head:     Right side of head: No submental or submandibular adenopathy.     Left side of head: No submental or submandibular adenopathy.     Cervical: No cervical adenopathy.  Skin:    General: Skin is warm and dry.  Neurological:     General: No focal deficit present.     Mental Status: He is alert and oriented to person, place, and time. Mental status is at baseline.     Motor: No weakness.     Gait: Gait normal.  Psychiatric:        Mood and Affect: Mood normal.        Behavior: Behavior normal.        Thought Content: Thought content normal.        Judgment: Judgment normal.        Assessment & Plan:    Routine Health Maintenance and  Physical Exam  Immunization History  Administered Date(s) Administered   Influenza, Seasonal, Injecte, Preservative Fre 10/27/2012, 07/18/2023, 08/27/2024   Influenza,inj,Quad PF,6+ Mos 09/21/2016, 08/16/2017, 07/05/2019, 07/03/2020, 08/13/2021, 08/25/2022   Tdap 01/16/2010, 10/09/2020    Health Maintenance  Topic Date Due   Pneumococcal Vaccine: 50+ Years (1 of 2 - PCV) Never done   Hepatitis B Vaccines 19-59 Average Risk (1 of 3 - 19+ 3-dose series) Never done   OPHTHALMOLOGY EXAM  03/14/2024   Diabetic kidney evaluation - Urine ACR  04/13/2024   HEMOGLOBIN A1C  07/24/2024   FOOT EXAM  12/21/2024   Diabetic kidney evaluation - eGFR measurement  01/21/2025   Colonoscopy  09/02/2029   DTaP/Tdap/Td (3 - Td or Tdap) 10/09/2030   Influenza Vaccine  Completed   Hepatitis C Screening  Completed   HIV Screening  Completed   HPV VACCINES  Aged Out   Meningococcal B Vaccine  Aged Out   COVID-19 Vaccine  Discontinued   Zoster Vaccines- Shingrix  Discontinued    Discussed health benefits of physical activity, and encouraged him to engage in regular exercise appropriate for his age and condition.  Annual physical exam  Immunization due -     Flu vaccine trivalent PF, 6mos and older(Flulaval,Afluria,Fluarix,Fluzone)  Diabetes mellitus without complication (HCC) -     Comprehensive metabolic panel with GFR -     Hemoglobin A1c -     Microalbumin / creatinine urine ratio -     Tirzepatide ; Inject 10 mg into the skin once a week.  Dispense: 6 mL; Refill: 0 -     metFORMIN  HCl; Take 1 tablet (500 mg total) by mouth 2 (two) times daily with a meal.  Dispense: 180 tablet; Refill: 1  Hypertension associated with diabetes (HCC) -     Comprehensive metabolic panel with GFR -     Lisinopril -hydroCHLOROthiazide ; Take 1 tablet by mouth daily.  Dispense: 90 tablet; Refill: 1  Hyperlipidemia associated with type 2 diabetes mellitus (HCC) -     Comprehensive metabolic panel with GFR -     Lipid  panel -     Atorvastatin  Calcium ; Take 1 tablet (20 mg total) by mouth daily.  Dispense: 90 tablet; Refill: 1  Low testosterone  -     Testosterone  -     Testosterone  Cypionate; Inject 200 mg as directed every 14 (fourteen) days for 6 doses.  Dispense: 6 mL; Refill: 0  Prostate cancer screening -     PSA  Class 3 severe obesity due to excess calories with serious comorbidity and body mass index (BMI) of 40.0 to 44.9 in adult (HCC) -     CBC with Differential/Platelet -     TSH  Gastroesophageal reflux disease without esophagitis -     Omeprazole ; Take 1 capsule (40 mg total) by mouth daily.  Dispense: 90 capsule; Refill: 1  Anxiety -  Sertraline  HCl; Take 1 tablet (100 mg total) by mouth daily.  Dispense: 90 tablet; Refill: 1  Erectile dysfunction, unspecified erectile dysfunction type -     Sildenafil  Citrate; Take 1 tablet (50 mg total) by mouth daily as needed for up to 30 doses for erectile dysfunction.  Dispense: 30 tablet; Refill: 0  1.Review health maintenance: -Influenza vaccine: Administer -Hep B vaccine: Unknown -PNA vaccine: Declines  2.Physical exam completed. 3.Continue to work on a healthy diet and staying active.  4.Refilled several medications for chronic management: Sildenafil , Sertraline , Omeprazole , Testosterone  (PDMP reviewed-last refill was on 01/31/2024), Atorvastatin , and Lisinopril -HCTZ. Increased Mounjaro  to 10mg  per injection from 7.5mg .  5.Ordered labs (CBC, CMP, TSH, PSA, Lipid panel-fasting, A1c, testosterone ) and microalbumin/creat urine sample. Office will call with lab results and will be available via MyChart. Return in about 6 months (around 02/25/2025) for chronic management.    Madox Corkins, NP

## 2024-08-27 NOTE — Patient Instructions (Addendum)
-  It was great to see you today. Congratulations on the new bundle of joy coming! -Physical exam completed. -Influenza vaccine provided.  -Continue to work on a healthy diet and staying active.  -Refilled several medications. Increased Mounjaro  to 10mg  per injection from 7.5mg .  -Ordered labs and urine sample. Office will call with lab results and will be available via MyChart. -Follow up in 6 months for chronic management.

## 2024-08-29 ENCOUNTER — Ambulatory Visit: Payer: Self-pay | Admitting: Family Medicine

## 2024-08-30 ENCOUNTER — Other Ambulatory Visit (HOSPITAL_COMMUNITY): Payer: Self-pay

## 2024-09-25 ENCOUNTER — Telehealth: Admitting: Emergency Medicine

## 2024-09-25 ENCOUNTER — Ambulatory Visit: Payer: Self-pay

## 2024-09-25 DIAGNOSIS — J019 Acute sinusitis, unspecified: Secondary | ICD-10-CM | POA: Diagnosis not present

## 2024-09-25 DIAGNOSIS — B9689 Other specified bacterial agents as the cause of diseases classified elsewhere: Secondary | ICD-10-CM

## 2024-09-25 MED ORDER — AMOXICILLIN-POT CLAVULANATE 875-125 MG PO TABS: 1.0000 | ORAL_TABLET | Freq: Two times a day (BID) | ORAL | 0 refills | Status: AC

## 2024-09-25 NOTE — Telephone Encounter (Signed)
 1st attempt to reach patient's wife, Amy. Left message with office call back number.  Copied from CRM #[id number]. Topic: Clinical - Medical Advice >> Sep 25, 2024  1:49 PM Robinson H wrote: Reason for CRM: Patients wife Amy calling states patient has a sinus infection, having some pressure and headache taking Augmentin  but wants to know if he should be taking Prednisone and more Augmentin  and if it could be called in to the [facility] on file.  [place] [phone]

## 2024-09-25 NOTE — Telephone Encounter (Signed)
 2nd attempt to reach patient's wife Amy, left message with office callback number.

## 2024-09-25 NOTE — Telephone Encounter (Signed)
 3rd attempt to reach patient's wife Amy, left message with office callback number.

## 2024-09-25 NOTE — Telephone Encounter (Addendum)
 FYI Only or Action Required?: FYI only for provider: appointment scheduled on 09/25/2024 at 7:15 PM with [facility].  Patient was last seen in primary care on 08/27/2024 by Billy Philippe SAUNDERS, NP.  Called Nurse Triage reporting Sinusitis.  Symptoms began a week ago.  Interventions attempted: Rest, hydration, or home remedies.  Symptoms are: unchanged.  Triage Disposition: See HCP Within 4 Hours (Or PCP Triage)  Patient/caregiver understands and will follow disposition?: Yes  Patient called back and triaged.

## 2024-09-25 NOTE — Telephone Encounter (Signed)
 Reason for Disposition . [1] Redness or swelling on the cheek, forehead or around the eye AND [2] no fever  Answer Assessment - Initial Assessment Questions Patient reports symptoms of sinus pressure, headache and having green nasal discharge. Patient set up for a virtual appointment with [facility].   1. LOCATION: Where does it hurt?      Above and around eyes in the sinus cavities 2. ONSET: When did the sinus pain start?  (e.g., hours, days)      Started 4-5 days ago 3. SEVERITY: How bad is the pain?   (Scale 0-10; or none, mild, moderate or severe)     mild 4. RECURRENT SYMPTOM: Have you ever had sinus problems before? If Yes, ask: When was the last time? and What happened that time?      yes 5. NASAL CONGESTION: Is the nose blocked? If Yes, ask: Can you open it or must you breathe through your mouth?     Patient reports he is blocked with one nostril. Patient states he is having to breath through his mouth.  6. NASAL DISCHARGE: Do you have discharge from your nose? If so ask, What color?     Yes-green 7. FEVER: Do you have a fever? If Yes, ask: What is it, how was it measured, and when did it start?      no 8. OTHER SYMPTOMS: Do you have any other symptoms? (e.g., sore throat, cough, earache, difficulty breathing)     cough  Protocols used: Sinus Pain or Congestion-A-AH

## 2024-09-25 NOTE — Progress Notes (Signed)
 Virtual Visit Consent   John Mullins, you are scheduled for a virtual visit with a [facility] provider today. Just as with appointments in the office, your consent must be obtained to participate. Your consent will be active for this visit and any virtual visit you may have with one of our providers in the next 365 days. If you have a MyChart account, a copy of this consent can be sent to you electronically.  As this is a virtual visit, video technology does not allow for your provider to perform a traditional examination. This may limit your provider's ability to fully assess your condition. If your provider identifies any concerns that need to be evaluated in person or the need to arrange testing (such as labs, EKG, etc.), we will make arrangements to do so. Although advances in technology are sophisticated, we cannot ensure that it will always work on either your end or our end. If the connection with a video visit is poor, the visit may have to be switched to a telephone visit. With either a video or telephone visit, we are not always able to ensure that we have a secure connection.  By engaging in this virtual visit, you consent to the provision of healthcare and authorize for your insurance to be billed (if applicable) for the services provided during this visit. Depending on your insurance coverage, you may receive a charge related to this service.  I need to obtain your verbal consent now. Are you willing to proceed with your visit today? John Mullins has provided verbal consent on 09/25/2024 for a virtual visit (video or telephone). John CHRISTELLA Belt, NP  Date: 09/25/2024 7:20 PM   Virtual Visit via Video Note   I, John Mullins, connected with  John Mullins  ([id number], 1971-03-22) on 09/25/24 at  7:15 PM EST by a video-enabled telemedicine application and verified that I am speaking with the correct person using two identifiers.  Location: Patient: Virtual Visit Location Patient:  Home Provider: Virtual Visit Location Provider: Home Office   I discussed the limitations of evaluation and management by telemedicine and the availability of in person appointments. The patient expressed understanding and agreed to proceed.    History of Present Illness: John Mullins is a 53 y.o. who identifies as a male who was assigned male at birth, and is being seen today for sinus infection.   Headache x4-5 days, pressure behind eyes. Blowing yellow green stuff from nose. Taking advil cold and sinus and nyquil. No fever. Feels getting worse, not better. Coughing from post nasal drainage. No sore throat.   HPI: HPI  Problems:  Patient Active Problem List   Diagnosis Date Noted   Hypertension    Sleep apnea    Dyslipidemia 05/30/2023   Nonspecific abnormal electrocardiogram (ECG) (EKG) 05/30/2023   Hx of adenomatous polyp of colon 09/13/2022   Hyperlipidemia associated with type 2 diabetes mellitus (HCC) 08/25/2022   Normocytic anemia    Hypokalemia 06/10/2022   Hepatic steatosis 06/10/2022   Diabetes mellitus without complication (HCC) 01/27/2022   Paronychia of great toe of left foot 11/04/2021   COVID-19 vaccination declined 08/13/2021   Left adrenal mass 08/13/2021   Hypersomnia with sleep apnea 09/01/2015   Snoring 09/01/2015   GERD (gastroesophageal reflux disease) 06/04/2013   Obesity, Class III, BMI 40-49.9 (morbid obesity) (HCC) 06/04/2013   Anxiety 06/04/2013   Hypertension associated with diabetes (HCC)    Diaphragmatic hernia 11/28/2006    Allergies: No Known  Allergies Medications:  Current Outpatient Medications:    amoxicillin -clavulanate (AUGMENTIN ) 875-125 MG tablet, Take 1 tablet by mouth 2 (two) times daily., Disp: 14 tablet, Rfl: 0   aspirin  EC 81 MG tablet, Take 1 tablet (81 mg total) by mouth daily. Swallow whole., Disp: , Rfl:    atorvastatin  (LIPITOR) 20 MG tablet, Take 1 tablet (20 mg total) by mouth daily., Disp: 90 tablet, Rfl: 1    lisinopril -hydrochlorothiazide  (ZESTORETIC ) 20-12.5 MG tablet, Take 1 tablet by mouth daily., Disp: 90 tablet, Rfl: 1   metFORMIN  (GLUCOPHAGE ) 500 MG tablet, Take 1 tablet (500 mg total) by mouth 2 (two) times daily with a meal., Disp: 180 tablet, Rfl: 1   Multiple Vitamins-Minerals (MULTIVITAMIN ADULT, MINERALS, PO), Take 1 tablet by mouth daily., Disp: , Rfl:    omeprazole  (PRILOSEC) 40 MG capsule, Take 1 capsule (40 mg total) by mouth daily., Disp: 90 capsule, Rfl: 1   ondansetron  (ZOFRAN -ODT) 4 MG disintegrating tablet, Take 4 mg by mouth every 8 (eight) hours as needed for nausea or vomiting., Disp: , Rfl:    sertraline  (ZOLOFT ) 100 MG tablet, Take 1 tablet (100 mg total) by mouth daily., Disp: 90 tablet, Rfl: 1   sildenafil  (VIAGRA ) 50 MG tablet, Take 1 tablet (50 mg total) by mouth daily as needed for up to 30 doses for erectile dysfunction., Disp: 30 tablet, Rfl: 0   Testosterone  Cypionate 200 MG/ML SOLN, Inject 200 mg as directed every 14 (fourteen) days for 6 doses., Disp: 6 mL, Rfl: 0   tirzepatide  (MOUNJARO ) 10 MG/0.5ML Pen, Inject 10 mg into the skin once a week., Disp: 6 mL, Rfl: 0  Observations/Objective: Patient is well-developed, well-nourished in no acute distress.  Resting comfortably  at home.  Head is normocephalic, atraumatic.  No labored breathing.  Speech is clear and coherent with logical content.  Patient is alert and oriented at baseline.    Assessment and Plan: 1. Acute bacterial sinusitis (Primary) - amoxicillin -clavulanate (AUGMENTIN ) 875-125 MG tablet; Take 1 tablet by mouth 2 (two) times daily.  Dispense: 14 tablet; Refill: 0  Rec saline spray and mucinex . Has dm, it appears to be controlled.   Follow Up Instructions: I discussed the assessment and treatment plan with the patient. The patient was provided an opportunity to ask questions and all were answered. The patient agreed with the plan and demonstrated an understanding of the instructions.  A copy of  instructions were sent to the patient via MyChart unless otherwise noted below.   The patient was advised to call back or seek an in-person evaluation if the symptoms worsen or if the condition fails to improve as anticipated.    John CHRISTELLA Belt, NP

## 2024-09-25 NOTE — Patient Instructions (Signed)
 John Mullins, thank you for joining [name] Belt, NP for today's virtual visit.  While this provider is not your primary care provider (PCP), if your PCP is located in our provider database this encounter information will be shared with them immediately following your visit.   A [facility] MyChart account gives you access to today's visit and all your visits, tests, and labs performed at [facility]  click here if you don't have a [facility] MyChart account or go to [email or web address]  Consent: (Patient) John Mullins provided verbal consent for this virtual visit at the beginning of the encounter.  Current Medications:  Current Outpatient Medications:    amoxicillin -clavulanate (AUGMENTIN ) 875-125 MG tablet, Take 1 tablet by mouth 2 (two) times daily., Disp: 14 tablet, Rfl: 0   aspirin  EC 81 MG tablet, Take 1 tablet (81 mg total) by mouth daily. Swallow whole., Disp: , Rfl:    atorvastatin  (LIPITOR) 20 MG tablet, Take 1 tablet (20 mg total) by mouth daily., Disp: 90 tablet, Rfl: 1   lisinopril -hydrochlorothiazide  (ZESTORETIC ) 20-12.5 MG tablet, Take 1 tablet by mouth daily., Disp: 90 tablet, Rfl: 1   metFORMIN  (GLUCOPHAGE ) 500 MG tablet, Take 1 tablet (500 mg total) by mouth 2 (two) times daily with a meal., Disp: 180 tablet, Rfl: 1   Multiple Vitamins-Minerals (MULTIVITAMIN ADULT, MINERALS, PO), Take 1 tablet by mouth daily., Disp: , Rfl:    omeprazole  (PRILOSEC) 40 MG capsule, Take 1 capsule (40 mg total) by mouth daily., Disp: 90 capsule, Rfl: 1   ondansetron  (ZOFRAN -ODT) 4 MG disintegrating tablet, Take 4 mg by mouth every 8 (eight) hours as needed for nausea or vomiting., Disp: , Rfl:    sertraline  (ZOLOFT ) 100 MG tablet, Take 1 tablet (100 mg total) by mouth daily., Disp: 90 tablet, Rfl: 1   sildenafil  (VIAGRA ) 50 MG tablet, Take 1 tablet (50 mg total) by mouth daily as needed for up to 30 doses for erectile dysfunction., Disp: 30 tablet, Rfl: 0    Testosterone  Cypionate 200 MG/ML SOLN, Inject 200 mg as directed every 14 (fourteen) days for 6 doses., Disp: 6 mL, Rfl: 0   tirzepatide  (MOUNJARO ) 10 MG/0.5ML Pen, Inject 10 mg into the skin once a week., Disp: 6 mL, Rfl: 0   Medications ordered in this encounter:  Meds ordered this encounter  Medications   amoxicillin -clavulanate (AUGMENTIN ) 875-125 MG tablet    Sig: Take 1 tablet by mouth 2 (two) times daily.    Dispense:  14 tablet    Refill:  0     *If you need refills on other medications prior to your next appointment, please contact your pharmacy*  Follow-Up: Call back or seek an in-person evaluation if the symptoms worsen or if the condition fails to improve as anticipated.  [facility] [phone]  Other Instructions  Please also purchase saline nasal spray and Mucinex  (or generic guaifenesin ) to help your congestion thin and drain. The antibiotics will kill bacteria but they do not help relieve congestion.    If you have been instructed to have an in-person evaluation today at a local Urgent Care facility, please use the link below. It will take you to a list of all of our available [facility], including address, phone number and hours of operation. Please do not delay care.  [facility]  If you or a family member do not have a primary care provider, use the link below to schedule a visit and establish  care. When you choose a [place] primary care physician or advanced practice provider, you gain a long-term partner in health. Find a Primary Care Provider  Learn more about [facility] in-office and virtual care options:  - Get Care Now

## 2025-02-25 ENCOUNTER — Ambulatory Visit: Admitting: Family Medicine
